# Patient Record
Sex: Male | Born: 1940 | Race: White | Hispanic: No | Marital: Married | Smoking: Current some day smoker
Health system: Southern US, Community
[De-identification: ages and names within clinical notes are randomized; demographics above are authoritative.]

## PROBLEM LIST (undated history)

## (undated) DIAGNOSIS — I2510 Atherosclerotic heart disease of native coronary artery without angina pectoris: Secondary | ICD-10-CM

## (undated) DIAGNOSIS — R0602 Shortness of breath: Secondary | ICD-10-CM

## (undated) DIAGNOSIS — J40 Bronchitis, not specified as acute or chronic: Secondary | ICD-10-CM

## (undated) DIAGNOSIS — E119 Type 2 diabetes mellitus without complications: Secondary | ICD-10-CM

## (undated) DIAGNOSIS — Z87442 Personal history of urinary calculi: Secondary | ICD-10-CM

## (undated) DIAGNOSIS — F17210 Nicotine dependence, cigarettes, uncomplicated: Secondary | ICD-10-CM

## (undated) DIAGNOSIS — Z8042 Family history of malignant neoplasm of prostate: Secondary | ICD-10-CM

## (undated) DIAGNOSIS — K2970 Gastritis, unspecified, without bleeding: Secondary | ICD-10-CM

## (undated) DIAGNOSIS — K589 Irritable bowel syndrome without diarrhea: Secondary | ICD-10-CM

## (undated) DIAGNOSIS — H269 Unspecified cataract: Secondary | ICD-10-CM

## (undated) DIAGNOSIS — K449 Diaphragmatic hernia without obstruction or gangrene: Secondary | ICD-10-CM

## (undated) DIAGNOSIS — N189 Chronic kidney disease, unspecified: Secondary | ICD-10-CM

## (undated) DIAGNOSIS — J449 Chronic obstructive pulmonary disease, unspecified: Secondary | ICD-10-CM

## (undated) DIAGNOSIS — R7303 Prediabetes: Secondary | ICD-10-CM

## (undated) DIAGNOSIS — M545 Low back pain, unspecified: Secondary | ICD-10-CM

## (undated) DIAGNOSIS — K219 Gastro-esophageal reflux disease without esophagitis: Secondary | ICD-10-CM

## (undated) DIAGNOSIS — Z87438 Personal history of other diseases of male genital organs: Secondary | ICD-10-CM

## (undated) DIAGNOSIS — Z800 Family history of malignant neoplasm of digestive organs: Secondary | ICD-10-CM

## (undated) DIAGNOSIS — M542 Cervicalgia: Secondary | ICD-10-CM

## (undated) DIAGNOSIS — M1990 Unspecified osteoarthritis, unspecified site: Secondary | ICD-10-CM

## (undated) DIAGNOSIS — E785 Hyperlipidemia, unspecified: Secondary | ICD-10-CM

## (undated) DIAGNOSIS — Z86010 Personal history of colonic polyps: Secondary | ICD-10-CM

## (undated) DIAGNOSIS — K5730 Diverticulosis of large intestine without perforation or abscess without bleeding: Secondary | ICD-10-CM

## (undated) DIAGNOSIS — K635 Polyp of colon: Secondary | ICD-10-CM

## (undated) DIAGNOSIS — I10 Essential (primary) hypertension: Secondary | ICD-10-CM

## (undated) HISTORY — DX: Unspecified osteoarthritis, unspecified site: M19.90

## (undated) HISTORY — DX: Unspecified cataract: H26.9

## (undated) HISTORY — DX: Hyperlipidemia, unspecified: E78.5

## (undated) HISTORY — DX: Diverticulosis of large intestine without perforation or abscess without bleeding: K57.30

## (undated) HISTORY — DX: Polyp of colon: K63.5

## (undated) HISTORY — DX: Irritable bowel syndrome, unspecified: K58.9

## (undated) HISTORY — DX: Personal history of urinary calculi: Z87.442

## (undated) HISTORY — DX: Low back pain, unspecified: M54.50

## (undated) HISTORY — DX: Gastro-esophageal reflux disease without esophagitis: K21.9

## (undated) HISTORY — DX: Cervicalgia: M54.2

## (undated) HISTORY — PX: ROTATOR CUFF REPAIR: SHX139

## (undated) HISTORY — DX: Type 2 diabetes mellitus without complications: E11.9

## (undated) HISTORY — DX: Diaphragmatic hernia without obstruction or gangrene: K44.9

## (undated) HISTORY — DX: Gastritis, unspecified, without bleeding: K29.70

## (undated) HISTORY — DX: Chronic obstructive pulmonary disease, unspecified: J44.9

## (undated) HISTORY — PX: LITHOTRIPSY: SUR834

## (undated) HISTORY — DX: Nicotine dependence, cigarettes, uncomplicated: F17.210

## (undated) HISTORY — DX: Personal history of other diseases of male genital organs: Z87.438

## (undated) HISTORY — PX: BACK SURGERY: SHX140

## (undated) HISTORY — DX: Essential (primary) hypertension: I10

## (undated) HISTORY — DX: Low back pain: M54.5

## (undated) HISTORY — PX: CERVICAL SPINE SURGERY: SHX589

---
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
## 1999-10-19 HISTORY — PX: UVULOPALATOPHARYNGOPLASTY: SHX827

## 2001-07-03 ENCOUNTER — Encounter: Payer: Self-pay | Admitting: Urology

## 2001-07-03 ENCOUNTER — Ambulatory Visit (HOSPITAL_COMMUNITY): Admission: RE | Admit: 2001-07-03 | Discharge: 2001-07-03 | Payer: Self-pay | Admitting: Urology

## 2001-07-03 ENCOUNTER — Encounter: Admission: RE | Admit: 2001-07-03 | Discharge: 2001-07-03 | Payer: Self-pay | Admitting: Urology

## 2001-07-06 ENCOUNTER — Ambulatory Visit (HOSPITAL_COMMUNITY): Admission: RE | Admit: 2001-07-06 | Discharge: 2001-07-06 | Payer: Self-pay | Admitting: Urology

## 2001-07-06 ENCOUNTER — Encounter: Payer: Self-pay | Admitting: Urology

## 2001-07-20 ENCOUNTER — Encounter: Payer: Self-pay | Admitting: Urology

## 2001-07-20 ENCOUNTER — Encounter: Admission: RE | Admit: 2001-07-20 | Discharge: 2001-07-20 | Payer: Self-pay | Admitting: Urology

## 2001-07-31 ENCOUNTER — Encounter: Payer: Self-pay | Admitting: Urology

## 2001-07-31 ENCOUNTER — Ambulatory Visit (HOSPITAL_COMMUNITY): Admission: RE | Admit: 2001-07-31 | Discharge: 2001-07-31 | Payer: Self-pay | Admitting: Urology

## 2001-08-14 ENCOUNTER — Encounter: Admission: RE | Admit: 2001-08-14 | Discharge: 2001-08-14 | Payer: Self-pay | Admitting: Urology

## 2001-08-14 ENCOUNTER — Encounter: Payer: Self-pay | Admitting: Urology

## 2002-07-04 ENCOUNTER — Encounter: Admission: RE | Admit: 2002-07-04 | Discharge: 2002-07-04 | Payer: Self-pay | Admitting: Urology

## 2002-07-04 ENCOUNTER — Encounter: Payer: Self-pay | Admitting: Urology

## 2002-08-18 HISTORY — PX: CYSTOSCOPY: SHX5120

## 2002-09-10 ENCOUNTER — Inpatient Hospital Stay (HOSPITAL_COMMUNITY): Admission: RE | Admit: 2002-09-10 | Discharge: 2002-09-11 | Payer: Self-pay | Admitting: Urology

## 2002-09-10 ENCOUNTER — Encounter (INDEPENDENT_AMBULATORY_CARE_PROVIDER_SITE_OTHER): Payer: Self-pay

## 2004-09-28 ENCOUNTER — Ambulatory Visit: Payer: Self-pay | Admitting: Pulmonary Disease

## 2004-11-02 ENCOUNTER — Ambulatory Visit (HOSPITAL_COMMUNITY): Admission: RE | Admit: 2004-11-02 | Discharge: 2004-11-02 | Payer: Self-pay | Admitting: Urology

## 2004-12-22 ENCOUNTER — Encounter: Admission: RE | Admit: 2004-12-22 | Discharge: 2004-12-22 | Payer: Self-pay | Admitting: Neurosurgery

## 2005-01-05 ENCOUNTER — Encounter: Admission: RE | Admit: 2005-01-05 | Discharge: 2005-01-05 | Payer: Self-pay | Admitting: Neurosurgery

## 2005-01-11 ENCOUNTER — Ambulatory Visit: Payer: Self-pay | Admitting: Pulmonary Disease

## 2005-01-18 ENCOUNTER — Encounter: Admission: RE | Admit: 2005-01-18 | Discharge: 2005-01-18 | Payer: Self-pay | Admitting: Neurosurgery

## 2005-04-05 ENCOUNTER — Ambulatory Visit: Payer: Self-pay | Admitting: Pulmonary Disease

## 2005-04-27 ENCOUNTER — Ambulatory Visit: Payer: Self-pay | Admitting: Pulmonary Disease

## 2005-05-31 ENCOUNTER — Ambulatory Visit: Payer: Self-pay | Admitting: Pulmonary Disease

## 2005-08-03 ENCOUNTER — Ambulatory Visit: Payer: Self-pay | Admitting: Pulmonary Disease

## 2005-10-18 HISTORY — PX: OTHER SURGICAL HISTORY: SHX169

## 2005-10-25 ENCOUNTER — Ambulatory Visit: Payer: Self-pay | Admitting: Pulmonary Disease

## 2005-11-04 ENCOUNTER — Ambulatory Visit: Payer: Self-pay | Admitting: Pulmonary Disease

## 2006-06-30 ENCOUNTER — Ambulatory Visit: Payer: Self-pay | Admitting: Pulmonary Disease

## 2006-08-24 ENCOUNTER — Ambulatory Visit: Payer: Self-pay | Admitting: Pulmonary Disease

## 2006-08-27 ENCOUNTER — Encounter: Admission: RE | Admit: 2006-08-27 | Discharge: 2006-08-27 | Payer: Self-pay | Admitting: Neurosurgery

## 2006-09-17 HISTORY — PX: LUMBAR MICRODISCECTOMY: SHX99

## 2006-09-19 ENCOUNTER — Ambulatory Visit (HOSPITAL_COMMUNITY): Admission: RE | Admit: 2006-09-19 | Discharge: 2006-09-19 | Payer: Self-pay | Admitting: Neurosurgery

## 2006-11-22 ENCOUNTER — Ambulatory Visit: Payer: Self-pay | Admitting: Pulmonary Disease

## 2006-11-22 LAB — CONVERTED CEMR LAB
Albumin: 4.1 g/dL (ref 3.5–5.2)
Creatinine, Ser: 1.4 mg/dL (ref 0.4–1.5)
GFR calc Af Amer: 66 mL/min
HDL: 36.6 mg/dL — ABNORMAL LOW (ref >39.0)
Hgb A1c MFr Bld: 7.1 % — ABNORMAL HIGH (ref 4.6–6.0)
Potassium: 4.4 meq/L (ref 3.5–5.1)
Sodium: 141 meq/L (ref 135–145)
Total Bilirubin: 0.5 mg/dL (ref 0.3–1.2)
Total CHOL/HDL Ratio: 3.7
Total Protein: 6.6 g/dL (ref 6.0–8.3)
Triglycerides: 107 mg/dL (ref 0–149)
VLDL: 21 mg/dL (ref 0–40)

## 2007-04-24 ENCOUNTER — Ambulatory Visit: Payer: Self-pay | Admitting: Pulmonary Disease

## 2007-04-24 LAB — CONVERTED CEMR LAB
ALT: 20 units/L (ref 0–53)
AST: 21 units/L (ref 0–37)
Bilirubin, Direct: 0.1 mg/dL (ref 0.0–0.3)
CO2: 32 meq/L (ref 19–32)
Calcium: 9.6 mg/dL (ref 8.4–10.5)
Chloride: 108 meq/L (ref 96–112)
Cholesterol: 157 mg/dL (ref 0–200)
Glucose, Bld: 135 mg/dL — ABNORMAL HIGH (ref 70–99)
Total CHOL/HDL Ratio: 3.6
Total Protein: 6.7 g/dL (ref 6.0–8.3)

## 2007-07-27 DIAGNOSIS — Z860100 Personal history of colon polyps, unspecified: Secondary | ICD-10-CM

## 2007-07-27 DIAGNOSIS — Z87442 Personal history of urinary calculi: Secondary | ICD-10-CM | POA: Insufficient documentation

## 2007-07-27 DIAGNOSIS — J449 Chronic obstructive pulmonary disease, unspecified: Secondary | ICD-10-CM | POA: Insufficient documentation

## 2007-07-27 DIAGNOSIS — Z87898 Personal history of other specified conditions: Secondary | ICD-10-CM | POA: Insufficient documentation

## 2007-07-27 DIAGNOSIS — J4489 Other specified chronic obstructive pulmonary disease: Secondary | ICD-10-CM | POA: Insufficient documentation

## 2007-07-27 DIAGNOSIS — M1990 Unspecified osteoarthritis, unspecified site: Secondary | ICD-10-CM | POA: Insufficient documentation

## 2007-07-27 HISTORY — DX: Personal history of colonic polyps: Z86.010

## 2007-07-27 HISTORY — DX: Personal history of colon polyps, unspecified: Z86.0100

## 2007-09-25 ENCOUNTER — Ambulatory Visit: Payer: Self-pay | Admitting: Pulmonary Disease

## 2007-09-25 DIAGNOSIS — I10 Essential (primary) hypertension: Secondary | ICD-10-CM | POA: Insufficient documentation

## 2007-10-05 LAB — CONVERTED CEMR LAB
BUN: 16 mg/dL (ref 6–23)
Chloride: 96 meq/L (ref 96–112)
Creatinine, Ser: 1.1 mg/dL (ref 0.4–1.5)
Hgb A1c MFr Bld: 6.9 % — ABNORMAL HIGH (ref 4.6–6.0)
VLDL: 29 mg/dL (ref 0–40)

## 2009-02-28 ENCOUNTER — Ambulatory Visit: Payer: Self-pay | Admitting: Pulmonary Disease

## 2009-03-01 DIAGNOSIS — F17200 Nicotine dependence, unspecified, uncomplicated: Secondary | ICD-10-CM | POA: Insufficient documentation

## 2009-03-01 DIAGNOSIS — E785 Hyperlipidemia, unspecified: Secondary | ICD-10-CM | POA: Insufficient documentation

## 2009-03-01 DIAGNOSIS — E1129 Type 2 diabetes mellitus with other diabetic kidney complication: Secondary | ICD-10-CM | POA: Insufficient documentation

## 2009-03-01 DIAGNOSIS — E119 Type 2 diabetes mellitus without complications: Secondary | ICD-10-CM | POA: Insufficient documentation

## 2009-03-01 LAB — CONVERTED CEMR LAB
AST: 18 units/L (ref 0–37)
BUN: 17 mg/dL (ref 6–23)
Basophils Absolute: 0 10*3/uL (ref 0.0–0.1)
Cholesterol: 133 mg/dL (ref 0–200)
Eosinophils Absolute: 0.2 10*3/uL (ref 0.0–0.7)
GFR calc non Af Amer: 71.31 mL/min (ref >60)
Glucose, Bld: 107 mg/dL — ABNORMAL HIGH (ref 70–99)
HCT: 44.5 % (ref 39.0–52.0)
HDL: 43.3 mg/dL (ref >39.00)
Hgb A1c MFr Bld: 6.5 % (ref 4.6–6.5)
Leukocytes, UA: NEGATIVE
Lymphs Abs: 2.1 10*3/uL (ref 0.7–4.0)
MCHC: 35 g/dL (ref 30.0–36.0)
Monocytes Absolute: 0.5 10*3/uL (ref 0.1–1.0)
Monocytes Relative: 8.7 % (ref 3.0–12.0)
Nitrite: NEGATIVE
Platelets: 256 10*3/uL (ref 150.0–400.0)
Potassium: 4.1 meq/L (ref 3.5–5.1)
RDW: 12 % (ref 11.5–14.6)
Specific Gravity, Urine: 1.02 (ref 1.000–1.030)
Total Bilirubin: 0.9 mg/dL (ref 0.3–1.2)
Urobilinogen, UA: 0.2 (ref 0.0–1.0)
VLDL: 19.4 mg/dL (ref 0.0–40.0)
pH: 6.5 (ref 5.0–8.0)

## 2009-04-11 ENCOUNTER — Ambulatory Visit: Payer: Self-pay | Admitting: Internal Medicine

## 2009-05-06 ENCOUNTER — Encounter: Payer: Self-pay | Admitting: Internal Medicine

## 2009-05-06 ENCOUNTER — Ambulatory Visit: Payer: Self-pay | Admitting: Internal Medicine

## 2009-05-06 HISTORY — PX: COLONOSCOPY W/ BIOPSIES AND POLYPECTOMY: SHX1376

## 2009-05-08 ENCOUNTER — Encounter: Payer: Self-pay | Admitting: Internal Medicine

## 2009-08-06 ENCOUNTER — Ambulatory Visit: Payer: Self-pay | Admitting: Pulmonary Disease

## 2009-09-08 ENCOUNTER — Telehealth: Payer: Self-pay | Admitting: Pulmonary Disease

## 2009-11-19 ENCOUNTER — Telehealth: Payer: Self-pay | Admitting: Pulmonary Disease

## 2009-12-01 ENCOUNTER — Encounter: Payer: Self-pay | Admitting: Pulmonary Disease

## 2009-12-01 ENCOUNTER — Telehealth: Payer: Self-pay | Admitting: Pulmonary Disease

## 2009-12-08 ENCOUNTER — Ambulatory Visit: Payer: Self-pay | Admitting: Pulmonary Disease

## 2009-12-12 ENCOUNTER — Ambulatory Visit: Payer: Self-pay | Admitting: Pulmonary Disease

## 2009-12-12 LAB — CONVERTED CEMR LAB
ALT: 17 units/L (ref 0–53)
AST: 21 units/L (ref 0–37)
BUN: 19 mg/dL (ref 6–23)
Basophils Relative: 0.7 % (ref 0.0–3.0)
Bilirubin Urine: NEGATIVE
CO2: 30 meq/L (ref 19–32)
Chloride: 106 meq/L (ref 96–112)
Cholesterol: 139 mg/dL (ref 0–200)
Creatinine, Ser: 1.1 mg/dL (ref 0.4–1.5)
Eosinophils Absolute: 0.3 10*3/uL (ref 0.0–0.7)
Hemoglobin, Urine: NEGATIVE
Leukocytes, UA: NEGATIVE
Lymphocytes Relative: 31.5 % (ref 12.0–46.0)
MCHC: 32.6 g/dL (ref 30.0–36.0)
MCV: 95.1 fL (ref 78.0–100.0)
Monocytes Absolute: 0.5 10*3/uL (ref 0.1–1.0)
Neutrophils Relative %: 52.7 % (ref 43.0–77.0)
Nitrite: NEGATIVE
PSA: 0.63 ng/mL (ref 0.10–4.00)
Platelets: 283 10*3/uL (ref 150.0–400.0)
Potassium: 4.7 meq/L (ref 3.5–5.1)
RBC: 4.97 M/uL (ref 4.22–5.81)
TSH: 2.19 microintl units/mL (ref 0.35–5.50)
Total Protein, Urine: NEGATIVE mg/dL
Total Protein: 7.4 g/dL (ref 6.0–8.3)
Triglycerides: 105 mg/dL (ref 0.0–149.0)
Urobilinogen, UA: 0.2 (ref 0.0–1.0)
WBC: 5 10*3/uL (ref 4.5–10.5)

## 2009-12-15 ENCOUNTER — Encounter: Payer: Self-pay | Admitting: Pulmonary Disease

## 2009-12-31 ENCOUNTER — Encounter: Payer: Self-pay | Admitting: Pulmonary Disease

## 2010-02-09 ENCOUNTER — Ambulatory Visit: Payer: Self-pay | Admitting: Pulmonary Disease

## 2010-02-09 ENCOUNTER — Telehealth (INDEPENDENT_AMBULATORY_CARE_PROVIDER_SITE_OTHER): Payer: Self-pay | Admitting: *Deleted

## 2010-07-13 ENCOUNTER — Encounter: Payer: Self-pay | Admitting: Adult Health

## 2010-07-13 ENCOUNTER — Ambulatory Visit: Payer: Self-pay | Admitting: Internal Medicine

## 2010-07-15 LAB — CONVERTED CEMR LAB
ALT: 17 units/L (ref 0–53)
Alkaline Phosphatase: 93 units/L (ref 39–117)
BUN: 19 mg/dL (ref 6–23)
Basophils Relative: 0.2 % (ref 0.0–3.0)
Bilirubin Urine: NEGATIVE
Bilirubin, Direct: 0.1 mg/dL (ref 0.0–0.3)
Calcium: 9.7 mg/dL (ref 8.4–10.5)
Chloride: 102 meq/L (ref 96–112)
Creatinine, Ser: 1 mg/dL (ref 0.4–1.5)
Eosinophils Relative: 2.3 % (ref 0.0–5.0)
GFR calc non Af Amer: 75.76 mL/min (ref >60)
Lymphocytes Relative: 17.6 % (ref 12.0–46.0)
MCV: 92.2 fL (ref 78.0–100.0)
Monocytes Absolute: 0.5 10*3/uL (ref 0.1–1.0)
Monocytes Relative: 6.3 % (ref 3.0–12.0)
Neutrophils Relative %: 73.6 % (ref 43.0–77.0)
Nitrite: NEGATIVE
Platelets: 392 10*3/uL (ref 150.0–400.0)
RBC: 4.74 M/uL (ref 4.22–5.81)
Sed Rate: 5 mm/hr (ref 0–22)
Total Bilirubin: 0.5 mg/dL (ref 0.3–1.2)
Total Protein, Urine: NEGATIVE mg/dL
Total Protein: 6.5 g/dL (ref 6.0–8.3)
Urine Glucose: NEGATIVE mg/dL
WBC: 8.6 10*3/uL (ref 4.5–10.5)
pH: 5.5 (ref 5.0–8.0)

## 2010-07-27 ENCOUNTER — Ambulatory Visit: Payer: Self-pay | Admitting: Pulmonary Disease

## 2010-07-29 ENCOUNTER — Telehealth (INDEPENDENT_AMBULATORY_CARE_PROVIDER_SITE_OTHER): Payer: Self-pay | Admitting: *Deleted

## 2010-07-30 ENCOUNTER — Ambulatory Visit: Payer: Self-pay | Admitting: Pulmonary Disease

## 2010-07-30 LAB — CONVERTED CEMR LAB
Fecal Occult Blood: NEGATIVE
OCCULT 1: NEGATIVE
OCCULT 5: NEGATIVE

## 2010-08-03 ENCOUNTER — Encounter: Payer: Self-pay | Admitting: Pulmonary Disease

## 2010-08-17 ENCOUNTER — Ambulatory Visit: Payer: Self-pay | Admitting: Pulmonary Disease

## 2010-08-17 ENCOUNTER — Telehealth: Payer: Self-pay | Admitting: Internal Medicine

## 2010-08-17 ENCOUNTER — Telehealth: Payer: Self-pay | Admitting: Pulmonary Disease

## 2010-08-17 DIAGNOSIS — K589 Irritable bowel syndrome without diarrhea: Secondary | ICD-10-CM | POA: Insufficient documentation

## 2010-08-17 DIAGNOSIS — R634 Abnormal weight loss: Secondary | ICD-10-CM | POA: Insufficient documentation

## 2010-08-18 ENCOUNTER — Ambulatory Visit: Payer: Self-pay | Admitting: Cardiovascular Disease

## 2010-08-18 ENCOUNTER — Encounter: Payer: Self-pay | Admitting: Pulmonary Disease

## 2010-08-19 ENCOUNTER — Ambulatory Visit: Payer: Self-pay | Admitting: Internal Medicine

## 2010-08-19 ENCOUNTER — Encounter (INDEPENDENT_AMBULATORY_CARE_PROVIDER_SITE_OTHER): Payer: Self-pay | Admitting: *Deleted

## 2010-09-07 ENCOUNTER — Ambulatory Visit: Payer: Self-pay | Admitting: Internal Medicine

## 2010-09-07 HISTORY — PX: UPPER GASTROINTESTINAL ENDOSCOPY: SHX188

## 2010-09-10 ENCOUNTER — Telehealth: Payer: Self-pay | Admitting: Internal Medicine

## 2010-11-07 ENCOUNTER — Encounter: Payer: Self-pay | Admitting: Neurosurgery

## 2010-11-17 NOTE — Progress Notes (Signed)
Summary: pt here now wants apt  Phone Note Call from Patient   Caller: Patient Call For: nadel Summary of Call: patient is here now. wants to see tammy or nadel. he has a deep chest cold.  Initial call taken by: Valinda Hoar,  February 09, 2010 8:11 AM  Follow-up for Phone Call        told Dot Lanes to offer pt 3:45 today with tp--per Dot Lanes pt took this appt  Follow-up by: Philipp Deputy CMA,  February 09, 2010 9:01 AM

## 2010-11-17 NOTE — Assessment & Plan Note (Signed)
Summary: abdominal pain/sheri    History of Present Illness Visit Type: Consult Primary GI MD: Stan Head MD [facility] Primary Kraven Calk: Lalla Brothers Requesting Wilbur Oakland: Lalla Brothers Chief Complaint: Abdominal Pain for 8 weeks, has been seeing Dr Kriste Basque for his pain History of Present Illness:   September 7 he took wife to airport for mission trip to [place]. He became acutely ill with nausea, vomiting and dairrhea. Then  a few days later it recured. Weak after that. Lost 12-15#. reduced activity for a while. He waited a week and things improved but stool has not normalized. He eats and then within an hour he has urge to defecate. No pain. Borborygmi has also been noted.  He saw Dr. Kriste Basque initially, held his meds including fiber pills and metformin and then resumed all but metformin after that. He has not regained weight yet, remains fatigued. Appetite not as robust as before.  he started metronidazole yesterday.   GI Review of Systems    Reports abdominal pain, bloating, loss of appetite, nausea, vomiting, and  weight loss.      Denies acid reflux, belching, chest pain, dysphagia with liquids, dysphagia with solids, heartburn, vomiting blood, and  weight gain.      Reports change in bowel habits, diarrhea, and  irritable bowel syndrome.     Denies anal fissure, black tarry stools, constipation, diverticulosis, fecal incontinence, heme positive stool, hemorrhoids, jaundice, light color stool, liver problems, rectal bleeding, and  rectal pain.    Clinical Reports Reviewed:  Colonoscopy:  05/06/2009:  1) Polyps, multiple (6, max size 8 mm) ADENOMAS 2) Mild diverticulosis in the sigmoid colon 3) External hemorrhoids 4) Otherwise normal examination, excellent prep 5) prior adenoma(s) years ago 6) mother with suspected colon cancer  CT Abdomen/Pelvis  Procedure date:  08/18/2010  Findings:      1. No acute process in the abdomen or pelvis. 2.  Bilateral nonobstructive renal  calculi. 3.  Apparent gastric wall thickening, favored to be due to underdistension.     Current Medications (verified): 1)  Adult Aspirin Ec Low Strength 81 Mg  Tbec (Aspirin) .... Take 1 Tablet By Mouth Once A Day 2)  Hydrochlorothiazide 50 Mg  Tabs (Hydrochlorothiazide) .... Take 1/2 Tablet By Mouth Once A Day 3)  Potassium Citrate 1080 Mg Cr-Tabs (Potassium Citrate) .... Take 1 Tablet By Mouth Once A Day 4)  Simvastatin 20 Mg  Tabs (Simvastatin) .... Take 1 By Mouth At Bedtime 5)  Lopid 600 Mg  Tabs (Gemfibrozil) .... Take 1 Tablet By Mouth Once A Day 6)  Ondansetron Hcl 4 Mg Tabs (Ondansetron Hcl) .Marland Kitchen.. 1 By Mouth Every 8 Hr As Needed Nausea 7)  Fibertab 625 Mg Tabs (Calcium Polycarbophil) .... Take One By Mouth Two Times A Day 8)  Levsin 0.125 Mg Tabs (Hyoscyamine Sulfate) .Marland Kitchen.. 1 Three Times A Day As Needed Abdominal Cramping 9)  Metronidazole 500 Mg Tabs (Metronidazole) .... Take 1 Tab By Mouth Three Times A Day...  Allergies (verified): No Known Drug Allergies  Past History:  Past Medical History: Reviewed history from 12/08/2009 and no changes required.  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 S/P CSpine surg in 1986 &  1995 S/P UPPP surgery for snoring 2001 by DrShoemaker S/P cystoscopy/ TURP & stone removal 11/03 by DrEvans S/P dental implants in 2007 S/P L3-4 microdiscectomy 12/07 by DrPool  Family History: Father died age 68 from DM w/ complications Mother died at w/ hx of colon  cancer (?she had met dis and chemoRx), died of "natural causes" 3 Siblings: 1 Brother  2 Sisters  Social History: Married Children- 2  +smoker - 1-3 cigarettess daily when golfing social alcohol retired from [employer]  Review of Systems       The patient complains of fatigue, headaches-new, shortness of breath, and sleeping problems.         All other ROS negative except as per HPI.   Vital Signs:  Patient profile:   70 year old male Height:      71 inches Weight:      161 pounds BMI:     22.54 BSA:     1.92 Pulse rate:   78 / minute Pulse rhythm:   regular BP sitting:   110 / 80  (left arm)  Vitals Entered By: Merri Ray CMA Duncan Dull) (August 19, 2010 8:27 AM)  Physical Exam  General:  Well developed, well nourished, no acute distress. Eyes:  PERRLA, no icterus. Mouth:  upper dentures, missing some lower teeth Lungs:  Clear throughout to auscultation. Heart:  Regular rate and rhythm; no murmurs, rubs,  or bruits. Abdomen:  Soft, nontender and nondistended. No masses, hepatosplenomegaly or hernias noted. Normal bowel sounds. Extremities:  No clubbing, cyanosis, edema  noted. Cervical Nodes:  No significant cervical or supraclavicular adenopathy.  Psych:  Alert and cooperative. Normal mood and affect.   Impression & Recommendations:  Problem # 1:  ABNORMAL FINDINGS GI TRACT (ICD-793.4) Assessment New  Thickened gastric wall on CT. Inmages viewed Probably underdistention but in setting of the weight loss and recent problems, I have advised EGD. Risks, benefits,and indications of endoscopic procedure(s) were reviewed with the patient and all questions answered.  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 will stop fiberand stay off metformin (he says sugae\rs better - could be due to weight loss) taking metronidazole which is reasonable - he  may have some altered gut flora at least which this can help hemoccults negative - goes against ongoing infection stool studies pending but C. diff toxin negative CBC, CMET ok in Sept ESR 5 TSH NL in Feb 2011  Problem # 3:  WEIGHT LOSS (ICD-783.21) Assessment: Unchanged New to me: Associated with nausea/vomiting and diarrhe severe sxs better but not regaining weight and appetite not yet back.  Patient Instructions: 1)  Hold fiber pills - we will place a hold on your list so it will still be on there. 2)  We will see you at your procedure on 09/01/10. 3)  [facility] Patient Information Guide given to patient.  4)  Upper Endoscopy brochure given.  5)  Copy sent to : Alroy Dust, MD 6)  The medication list was reviewed and reconciled.  All changed / newly prescribed medications were explained.  A complete medication list was provided to the patient / caregiver.

## 2010-11-17 NOTE — Assessment & Plan Note (Signed)
Summary: cpx/jd   CC:  9 month ROV & yearly CPX....  History of Present Illness: 70 y/o Werner here for a follow up visit... he has multiple medical problems as noted below...    ~  Mar 01, 2009:  he has been on HCTZ and Potassium Citrate from Urology due to a hx of kidney stones- last in 2002... he is inclined to stop this medication at present and see how he does... he was followed by DrEvans and he will contact him at [facility] for f/u appt...  states feeling well overall, due for f/u of Chol & DM...   ~  December 08, 2009:  he has been feeling well- no new complaints or concerns... he is still taking the HCTZ but stopped his KCl- I refilled both & asked him to be sure he takes the K Bid if taking the diuretic... he never did f/u w/ DrEvans & prefers referral to Urology in [place]- I offered to wean his HCTZ/ KCl meds but her prefers to talk w/ Urology 1st & we will set this up... he is still smoking a few- mostly while golfing, none at home, est ~ 1ppwk... f/u CXR today is clear, he knows he needs to quit entirely...  he had f/u colonoscopy 7/10 by DrGessner w/ several adenomatous polyps removed- f/u planned 39yrs...    Current Problems:   S/P ENT SURG by DrShoemaker 2000 for SNORING- ?UPPP w/ resolution of snoring problem...  COPD (ICD-496) - smoker w/ hx of intermittent bronchitic problems, no chronic dyspnea, cough, sputum, etc... not on regular meds and hasn't required antibiotics in quite some time...  ~  PFT's 2000 showed FVC= 3.33 (74%), FEV1= 1.76 (45%), %1sec= 52, mid-flows= 34% pred.  ~  baseline CXR w/ sl hyperinflation, clear, NAD.Marland Kitchen.  ~  f/u CXR 2/11 shows no acute changes...  CIGARETTE SMOKER (ICD-305.1) - still smokes on occas but not regularly per pt- eg. on the golf [place]... he was able to decr smoking w/ Chantix Rx... he knows that he needs to discontinue all smoking.  HYPERTENSION (ICD-401.9) - diet controlled, but he was placed on HCTZ by DrEvans for kidney stone prevention in  about 2003, and he wants to stop this now... asked to monitor BP at home w/ digital cuff...  BP today = 110/74> denies HA, fatigue, visual changes, CP, palipit, dizziness, syncope, dyspnea, edema, etc...   HYPERLIPIDEMIA (ICD-272.4) - on SIMVASTATIN 20mg /d & LOPID 600mg /d... his TG level was [phone] range in 1995-6 w/ Gemfibrizol started then...  ~  FLP 1/07 on Lopid showed TChol 207, TG 77, HDL 41, LDL 147... rec> Simva20 added.  ~  FLP 11/07 showed TChol 141, TG 82, HDL 37, LDL 87  ~  FLP 7/08 showed TChol 157, TG 99, HDL 44, LDL 94  ~  FLP 5/10 showed TChol 133, TG 97, HDL 43, LDL 70  ~  FLP 2/11 showed   DIABETES MELLITUS (ICD-250.00) - + FamHx w/ DM in father... pt diet controlled in past- but in 2007 he took Medrol Rx from [employer], Neurosurg for LBP and developed problems while on a mission trip to [place]... Metformin started then and currently taking METFORMIN 500mg  Qam...  ~  labs 11/07 showed BS= 98, A1c= 7.1  ~  labs 7/08 showed BS= 135, A1c= 6.7  ~  labs 12/08 showed BS= 111, A1c= 6.9  ~  labs 5/10 showed BS= 107, A1c= 6.5  ~  labs 2/11 showed   HIATAL HERNIA (ICD-553.3) - he had an  EGD in 1987 showing a patulous LES, GERD, antritis...   DIVERTICULOSIS OF COLON (ICD-562.10) COLONIC POLYPS (ICD-211.3) - there is a pos family hx of colon cancer in his mother...  ~  colonoscopy 7/04 showed divertics, hems, no polyps... f/u planned 5 yrs.  ~  finally sched his f/u colon 7/10 DrGessner w/ 6 polyps removed- max 8mm- mult tubular adenomas removed & f/u planned 29yrs. HEMORRHOIDS (ICD-455.6)  RENAL CALCULUS, HX OF (ICD-V13.01) - on HCTZ 50mg /d and Urocit-K Bid per DrEvans... he wants to stop these meds but prefers to have Urology appt 1st to discuss this & we will set this up for him.  ~  s/p right ureteroscopy 2002 by DrEvans for stone...  BENIGN PROSTATIC HYPERTROPHY, HX OF (ICD-V13.8) - s/p TURP & removal of bladder stone in 2003 by DrEvans...  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 Hx of NECK PAIN (ICD-723.1) - s/p CSpine surg x2 in 1986 & 1995... Hx of BACK PAIN, LUMBAR (ICD-724.2) - s/p Lumbar microdiscectomy 12/07 by DrPool...  Health Maintenance:  ~  GI= DrGessner  ~  GU= prev DrEvans & we will refer to [facility] per his request... DRE neg, PSA pending.  ~  Immunizations:  had 2010 Flu vaccine... ? Pneumovax ? Tetanus? probably before his [place] trip in 2006.   Allergies (verified): No Known Drug Allergies  Comments:  Nurse/Medical Assistant: The patient's medications and allergies were reviewed with the patient and were updated in the Medication and Allergy Lists.  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 Father died age 52 from DM w/ complications Mother alive age 37 w/ hx of colon cancer (?she had met dis and chemoRx) 3 Siblings: 1 Brother  2 Sisters  Social History: Reviewed history from 08/06/2009 and no changes required. Married Children +smoker - 1-3 cigs daily social alcohol retired from [employer]  Review of Systems       The patient complains of stiffness and arthritis.  The patient denies fever,  chills, sweats, anorexia, fatigue, weakness, malaise, weight loss, sleep disorder, blurring, diplopia, eye irritation, eye discharge, vision loss, eye pain, photophobia, earache, ear discharge, tinnitus, decreased hearing, nasal congestion, nosebleeds, sore throat, hoarseness, chest pain, palpitations, syncope, dyspnea on exertion, orthopnea, PND, peripheral edema, cough, dyspnea at rest, excessive sputum, hemoptysis, wheezing, pleurisy, nausea, vomiting, diarrhea, constipation, change in bowel habits, abdominal pain, melena, hematochezia, jaundice, gas/bloating, indigestion/heartburn, dysphagia, odynophagia, dysuria, hematuria, urinary frequency, urinary hesitancy, nocturia, incontinence, back pain, joint pain, joint swelling, muscle cramps, muscle weakness, sciatica, restless legs, leg pain at night, leg pain with exertion, rash, itching, dryness, suspicious lesions, paralysis, paresthesias, seizures, tremors, vertigo, transient blindness, frequent falls, frequent headaches, difficulty walking, depression, anxiety, memory loss, confusion, cold intolerance, heat intolerance, polydipsia, polyphagia, polyuria, unusual weight change, abnormal bruising, bleeding, enlarged lymph nodes, urticaria, allergic rash, hay fever, and recurrent  infections.    Vital Signs:  Patient profile:   70 year old male Height:      71 inches Weight:      180.13 pounds O2 Sat:      96 % on Room air Temp:     98.4 degrees F oral Pulse rate:   75 / minute BP sitting:   110 / 74  (left arm) Cuff size:   regular  Vitals Entered By: Randell Loop CMA (December 08, 2009 10:30 AM)  O2 Sat at Rest %:  96 O2 Flow:  Room air CC: 9 month ROV & yearly CPX... Is Patient Diabetic? Yes Pain Assessment Patient in pain? no      Comments meds updated today   Physical Exam  Additional Exam:  WD, WN, 70 y/o Werner in NAD... GENERAL:  Alert & oriented; pleasant & cooperative... HEENT:  [place]/AT, EOM-wnl, PERRLA, EACs-clear, TMs-wnl,  NOSE-clear, THROAT-clear & wnl. NECK:  Supple w/ decrROM; no JVD; normal carotid impulses w/o bruits; no thyromegaly or nodules palpated; no lymphadenopathy. CHEST:  Clear to P & A; without wheezes/ rales/ or rhonchi heard... HEART:  Regular Rhythm; without murmurs/ rubs/ or gallops detected... ABDOMEN:  Soft & nontender; normal bowel sounds; no organomegaly or masses palpated... EXT: without deformities, mild arthritic changes; no varicose veins/ venous insuffic/ or edema. NEURO: CNs intact, no focal neuro deficits... DERM:  No lesions noted; no rash etc...    CXR  Procedure date:  12/08/2009  Findings:      CHEST - 2 VIEW Comparison: 02/28/2009   Findings: Heart and mediastinal contours are within normal limits. No focal opacities or effusions.  No acute bony abnormality.   IMPRESSION: No acute cardiopulmonary disease.   No change.   Read By:  Charlett Nose,  M.D.    MISC. Report  Procedure date:  12/08/2009  Findings:      He will have to return FASTING for blood work later this week... we will review & notify the pt of these results...  SN   Impression & Recommendations:  Problem # 1:  COPD (ICD-496) CXR is clear, he is asymptomatic but still smoking a few... he does not want inhaled meds, & feels he is doing well- advised to quit smoking completely... Orders: T-2 View CXR (71020TC)  Problem # 2:  HYPERTENSION (ICD-401.9) BP well controlled w/ diet + HCT for renal stone disease... he wants to stop the diuretic & will f/u BP's at home off the med on diet alone... His updated medication list for this problem includes:    Hydrochlorothiazide 50 Mg Tabs (Hydrochlorothiazide) .Marland Kitchen... Take 1 tablet by mouth once a day  Problem # 3:  HYPERLIPIDEMIA (ICD-272.4) He's demonstrated good control on these meds last yr... continues on diet + same 2 meds w/ f/u FLP pending. His updated medication list for this problem includes:    Simvastatin 20 Mg Tabs (Simvastatin) .Marland Kitchen... Take  1 by mouth at bedtime    Lopid 600 Mg Tabs (Gemfibrozil) .Marland Kitchen... Take 1 tablet by mouth once a day  Problem # 4:  DIABETES MELLITUS (ICD-250.00) Similarly he's had good control on BS on diet alone... f/u lab pending. His updated medication list for this problem includes:    Adult Aspirin Ec Low Strength 81 Mg Tbec (Aspirin) .Marland Kitchen... Take 1 tablet by mouth once a day    Metformin Hcl 500 Mg Xr24h-tab (Metformin hcl) .Marland Kitchen... Take 1 tab by mouth once daily...  Problem # 5:  COLONIC POLYPS (ICD-211.3) GI stable-  s/p colon 7/10 w/ 6 polyps removed... DrGesssner plans f/u procedure in 48yrs.  Problem # 6:  RENAL CALCULUS, HX OF (ICD-V13.01) PSA is pending & we will forward to Urology... 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 His updated medication list for this problem includes:    Adult Aspirin Ec Low Strength 81 Mg Tbec (Aspirin) .Marland Kitchen... Take 1 tablet by mouth once a day  Problem # 8:  OTHER MEDICAL PROBLEMS AS NOTED>>>  Complete Medication List: 1)  Adult Aspirin Ec Low Strength 81 Mg Tbec (Aspirin) .... Take 1 tablet by mouth once a day 2)  Hydrochlorothiazide 50 Mg Tabs (Hydrochlorothiazide) .... Take 1 tablet by mouth once a day 3)  Potassium Citrate 1080 Mg Cr-tabs (Potassium citrate) .... Take 2 tablets by mouth daily 4)  Simvastatin 20 Mg Tabs (Simvastatin) .... Take 1 by mouth at bedtime 5)  Lopid 600 Mg Tabs (Gemfibrozil) .... Take 1 tablet by mouth once a day 6)  Metformin Hcl 500 Mg Xr24h-tab (Metformin hcl) .... Take 1 tab by mouth once daily.Marland KitchenMarland Kitchen 7)  Viagra 100 Mg Tabs (Sildenafil citrate) .... Use as directed...  Other Orders: Prescription Created Electronically [phone]) Pneumococcal Vaccine (60454) Admin 1st Vaccine (09811)  Patient Instructions: 1)  Today we  updated your med list- see below.... 2)  We refilled your meds for 2011... 3)  We gave you a PNEUMONIA vaccine today- (currently they are recommending one shot after age 59 for all adults) 4)  We will arrange for a follow up appt w/ Urology to discuss your hx of kidney stones and the poss of coming off the HCTZ & K... 5)  Today we did your follow up CXR, please return to our lab later this week for your fasting blood work... then call the "phone tree" in a few days for your lab results.Marland KitchenMarland Kitchen 6)  Call for any questions.Marland KitchenMarland Kitchen 7)  Please schedule a follow-up appointment in 1 year, sooner as needed. Prescriptions: VIAGRA 100 MG TABS (SILDENAFIL CITRATE) use as directed...  #10 x prn   Entered and Authorized by:   Michele Mcalpine MD   Signed by:   Michele Mcalpine MD on 12/08/2009   Method used:   Print then Give to Patient   RxID:   [id number] METFORMIN HCL 500 MG XR24H-TAB (METFORMIN HCL) take 1 tab by mouth once daily...  #90 x prn   Entered and Authorized by:   Michele Mcalpine MD   Signed by:   Michele Mcalpine MD on 12/08/2009   Method used:   Print then Give to Patient   RxID:   [id number] LOPID 600 MG  TABS (GEMFIBROZIL) Take 1 tablet by mouth once a day  #90 x prn   Entered and Authorized by:   Michele Mcalpine MD   Signed by:   Michele Mcalpine MD on 12/08/2009   Method used:   Print then Give to Patient   RxID:   [id number] SIMVASTATIN 20 MG  TABS (SIMVASTATIN) take 1 by mouth at bedtime  #90 x prn   Entered and Authorized by:   Michele Mcalpine MD   Signed by:   Michele Mcalpine MD on 12/08/2009   Method used:   Print then Give to Patient   RxID:   [id number] POTASSIUM CITRATE 1080 MG  CR-TABS (POTASSIUM CITRATE) take 2 tablets by mouth daily  #180 x prn   Entered and Authorized by:   Michele Mcalpine MD   Signed by:   Michele Mcalpine MD on 12/08/2009   Method used:   Print then Give to Patient   RxID:   [id number] HYDROCHLOROTHIAZIDE 50 MG  TABS (HYDROCHLOROTHIAZIDE) Take 1 tablet  by mouth once a day  #90 x prn   Entered and Authorized by:   Michele Mcalpine MD   Signed by:   Michele Mcalpine MD on 12/08/2009   Method used:   Print then Give to Patient   RxID:   [id number]    Immunizations Administered:  Pneumonia Vaccine:    Vaccine Type: Pneumovax    Site: left deltoid    Mfr: Merck    Dose: 0.5 ml    Route: IM    Given by: Randell Loop CMA    Exp. Date: 02/05/2011    Lot #: [id number]    VIS given: 05/15/96 version given December 08, 2009.

## 2010-11-17 NOTE — Medication Information (Signed)
Summary: [facility]  Diabetes [facility]   Imported By: Sherian Rein 12/05/2009 10:26:05  _____________________________________________________________________  External Attachment:    Type:   Image     Comment:   External Document

## 2010-11-17 NOTE — Progress Notes (Signed)
Summary: Work in next wk   Phone Note From Other Clinic   Caller: [phone] [facility] @ Dr Kriste Basque Call For: Dr Leone Payor Reason for Call: Schedule Patient Appt Summary of Call: Wants pt seen next week for abd Pain. Having ultrasound tomorrow Initial call taken by: Leanor Kail [facility],  August 17, 2010 12:17 PM  Follow-up for Phone Call        Left message for patient to call back Darcey Nora RN, [facility]  August 17, 2010 3:08 PM  patient is schedueld to see Dr Percell Miller for 08/19/10 8:30.  Almyra Free will notify the patient. Follow-up by: Darcey Nora RN, CGRN,  August 17, 2010 3:43 PM

## 2010-11-17 NOTE — Letter (Signed)
Summary: [facility]   Imported By: Lester Henderson 12/26/2009 09:02:04  _____________________________________________________________________  External Attachment:    Type:   Image     Comment:   External Document

## 2010-11-17 NOTE — Letter (Signed)
Summary: [facility]   Imported By: Sherian Rein 01/07/2010 12:10:38  _____________________________________________________________________  External Attachment:    Type:   Image     Comment:   External Document

## 2010-11-17 NOTE — Progress Notes (Signed)
Summary: Supporting lab dx code  ---- Converted from flag ---- ---- 07/29/2010 9:17 AM, Rubye Oaks NP wrote: 789.09  thanks   ---- 07/29/2010 9:16 AM, Leodis Liverpool wrote: Do you know the code for abdominal pain? Darl Pikes  ---- 07/27/2010 11:27 AM, Rubye Oaks NP wrote: use abdominal pain  thanx   ---- 07/27/2010 9:11 AM, Leodis Liverpool wrote: Need diagnosis code for urine cx from 07/13/10.  535.50 denied by insurance when [employer].  Thanks. Darl Pikes ------------------------------

## 2010-11-17 NOTE — Procedures (Signed)
Summary: Upper Endoscopy  Patient: [name] Note: All result statuses are Final unless otherwise noted.  Tests: (1) Upper Endoscopy (EGD)   EGD Upper Endoscopy       DONE     [facility]     [address]     [place], [place]  [address]           ENDOSCOPY PROCEDURE REPORT           PATIENT:  Paul, Werner  MR#:  [id number]     BIRTHDATE:  Mar 15, 1941, 68 yrs. old  GENDER:  male           ENDOSCOPIST:  Iva Boop, MD, [facility]           PROCEDURE DATE:  09/07/2010     PROCEDURE:  EGD with biopsy, 84[address]     ASA CLASS:  Class II     INDICATIONS:  abnormal imaging Thickened stomach on CT scan.     Abdominal pain, weight loss after a gastroenteritis.           MEDICATIONS:   Fentanyl 50 mcg IV, Versed 5 mg IV     TOPICAL ANESTHETIC:  Exactacain Spray           DESCRIPTION OF PROCEDURE:   After the risks benefits and     alternatives of the procedure were thoroughly explained, informed     consent was obtained.  The [facility] [id number] endoscope was     introduced through the mouth and advanced to the second portion of     the duodenum, without limitations.  The instrument was slowly     withdrawn as the mucosa was fully examined.     <<PROCEDUREIMAGES>>           Inflammation was found in the distal esophagus. Suspected, ?     exudate/coating vs. actual inflammation in 1-2 cm distal     esophagus. Multiple biopsies were obtained and sent to pathology.     Moderate gastritis was found in the total stomach. Patchy  erythema     and erosions scattered throughout. Multiple biopsies were obtained     and sent to pathology.  Duodenitis was found in the bulb and     descending duodenum. Red spots, erythema. Multiple biopsies were     obtained and sent to pathology.  Otherwise the examination was     normal.    Retroflexed views revealed Retroflexion exam     demonstrated findings as previously described.    The scope was     then withdrawn from the patient and the  procedure completed.           COMPLICATIONS:  None           ENDOSCOPIC IMPRESSION:     1) Inflammation suspected in the distal esophagus     2) Moderate erosive  gastritis in the total stomach     3) Duodenitis in the bulb/descending duodenum     4) Otherwise normal examination     RECOMMENDATIONS:     Start omeprazole 40 mg before breakfast and await pathology for     next step(s).           REPEAT EXAM:  In for as needed.           Iva Boop, MD, Clement[facility] Graham           CC:  Michele Mcalpine, MD     The Patient  n.     eSIGNED:   Iva Boop at 09/07/2010 11:50 AM           [name], [id number]  Note: An exclamation mark (!) indicates a result that was not dispersed into the flowsheet. Document Creation Date: 09/07/2010 11:50 AM _______________________________________________________________________  (1) Order result status: Final Collection or observation date-time: 09/07/2010 11:37 Requested date-time:  Receipt date-time:  Reported date-time:  Referring Physician:   Ordering Physician: Stan Head [phone]) Specimen Source:  Source: Launa Grill Order Number: [id number] Lab site:   Appended Document: Upper Endoscopy   EGD  Procedure date:  09/07/2010  Findings:          1) Inflammation suspected in the distal esophagus     2) Moderate erosive  gastritis in the total stomach     3) Duodenitis in the bulb/descending duodenum     4) Otherwise normal examination     RECOMMENDATIONS:     Start omeprazole 40 mg before breakfast and await pathology for     next step(s).  1. Stomach, biopsy,  :  - BENIGN GASTRIC MUCOSA WITH FOCAL EROSION ASSOCIATED WITH MILD ACTIVE INFLAMMATION AND REACTIVE EPITHELIAL CHANGES. NO INTESTINAL METAPLASIA OR HELICOBACTER PYLORI ORGANISMS IDENTIFIED.   2. Duodenum, biopsy,  :  - BENIGN DUODENAL MUCOSA WITH MILD ACTIVE DUODENITIS, SEE COMMENT. 3. Esophagus, biopsy,  :  - FINDINGS CONSISTENT WITH GASTROESOPHAGEAL  REFLUX.NO INTESTINAL METAPLASIA , DYSPLASIA OR MALIGNANCY IDENTIFIED.

## 2010-11-17 NOTE — Assessment & Plan Note (Signed)
Summary: rov 2 wks ///kp   CC:  2 week follow up for diarrhea. .  History of Present Illness: 70 y/o WM here with known hx hyperlipidemia    ~  Mar 01, 2009:  he has been on HCTZ and Potassium Citrate from Urology due to a hx of kidney stones- last in 2002... he is inclined to stop this medication at present and see how he does... he was followed by DrEvans and he will contact him at [facility] for f/u appt...  states feeling well overall, due for f/u of Chol & DM...  August 06, 2009--Presents for an acute office visit. Complains of sore throat, chest congestion with yellow mucus production, HA, increased fatigue, some wheezing and SOB x1week. Used otc without help.      ~  December 08, 2009:  he has been feeling well- no new complaints or concerns... he is still taking the HCTZ but stopped his KCl- I refilled both & asked him to be sure he takes the K Bid if taking the diuretic... he never did f/u w/ DrEvans & prefers referral to Urology in [place]- I offered to wean his HCTZ/ KCl meds but her prefers to talk w/ Urology 1st & we will set this up... he is still smoking a few- mostly while golfing, none at home, est ~ 1ppwk... f/u CXR today is clear, he knows he needs to quit entirely...  he had f/u colonoscopy 7/10 by DrGessner w/ several adenomatous polyps removed- f/u planned 34yrs...  February 09, 2010 --Presents for an acute office visit. Complains of sore throat, prod cough with green/brown mucus, wheezing, SOB, f/c/s, HA x3days - was given doxycycline last ov, states this helped. OTC not helping.     July 13, 2010 --Presents for an acute office visit. Complains of recurrent stomach issues. He says he got sick 2.5 weeks w/ n/v/d x 1 day resolved on own. then reoccured 2 days later w/ 1 day of n/v/d. since then soft stools, nausea. No diarrhea . BM and urgency after eating. Marland Kitchenlost weight. appetite is down. He took cipro x 5 days (has left over for travel), finished 4 days ago.  Symptoms some better. No  severe abdominal pain. Some cramping w/ BM . NO bloody stools. NO fever.  Dx-Gastritis>>rx diet, zofran, labs unrevealing, PPI rx.   July 27, 2010--Presents for follow up. Last ov tx for suspected gastritis. Labs w/ ESR, CBC, CMET, amylase/lipase were normal. Tx w/ PPI , bland diet and zofran. He is some better w/ improved appetite and resolved vomitting. However upset stomache w/ loose stools and bowel urgency remian. Worse after eating. Feels like when he eats he has to go to [place] 45 min later. He lost 10lbs all together Prior to gastritis he weighed 170lbs. Now down to 160-162lbs. He has had no recent travel. Used Cipro initially as above but no other abx. No bloody stools. No abd pain except for cramping with BMs.  BS are averaging 70-130 fasting. No new meds. Stools are lose at times. Stool frequency has decreased alot. On average 1-2 times a day. BM urgency does not happen everyday. No prior episodes. Last colon was in 2010 w/ polyps removed. plan for repeat in 3 years per Dr. Leone Payor. Denies chest pain, dyspnea, orthopnea, hemoptysis, fever, n/v, edema, headache,recent travel or antibiotics.   Current Medications (verified): 1)  Adult Aspirin Ec Low Strength 81 Mg  Tbec (Aspirin) .... Take 1 Tablet By Mouth Once A Day 2)  Hydrochlorothiazide 50 Mg  Tabs (Hydrochlorothiazide) .... Take 1/2 Tablet By Mouth Once A Day 3)  Potassium Citrate 1080 Mg Cr-Tabs (Potassium Citrate) .... Take 1 Tablet By Mouth Once A Day 4)  Simvastatin 20 Mg  Tabs (Simvastatin) .... Take 1 By Mouth At Bedtime 5)  Lopid 600 Mg  Tabs (Gemfibrozil) .... Take 1 Tablet By Mouth Once A Day 6)  Metformin Hcl 500 Mg Xr24h-Tab (Metformin Hcl) .... Take 1 Tab By Mouth Once Daily.Marland KitchenMarland Kitchen 7)  Viagra 100 Mg Tabs (Sildenafil Citrate) .... Use As Directed... 8)  Ondansetron Hcl 4 Mg Tabs (Ondansetron Hcl) .Marland Kitchen.. 1 By Mouth Every 8 Hr As Needed Nausea 9)  Fibertab 625 Mg Tabs (Calcium Polycarbophil) .... Take One By Mouth Two Times A  Day  Allergies (verified): No Known Drug Allergies  Comments:  Nurse/Medical Assistant: The patient's medications and allergies were reviewed with the patient and were updated in the Medication and Allergy Lists.  Vital Signs:  Patient profile:   70 year old male Height:      71 inches Weight:      162.50 pounds BMI:     22.75 O2 Sat:      98 % on Room air Temp:     96.7 degrees F oral Pulse rate:   89 / minute BP sitting:   110 / 80  (right arm) Cuff size:   regular  O2 Flow:  Room air  Physical Exam  Additional Exam:  WD, WN, 70 y/o WM in NAD... GENERAL:  Alert & oriented; pleasant & cooperative. HEENT:  [place]/AT,sclera nonicteric ,  EOM-wnl, PERRLA, EACs-clear, TMs-wnl, NOSE-clear, THROAT-clear & wnl. NECK:  Supple w/ decrROM; no JVD; normal carotid impulses w/o bruits; no thyromegaly or nodules palpated; no lymphadenopathy. CHEST:  Clear to P & A; without wheezes/ rales/ or rhonchi. HEART:  Regular Rhythm; without murmurs/ rubs/ or gallops. ABDOMEN:  Soft & nontender; normal bowel sounds; no organomegaly or masses detected, no guarding or rebound, neg CVA symptoms.  EXT: without deformities, mild arthritic changes; no varicose veins/ venous insuffic/ or edema. NEURO:   intact DERM:  No lesions noted; no rash etc...     Impression & Recommendations:  Problem # 1:  GASTRITIS (ICD-535.50)  Slowly improving gastritis/colitis . Pt has lost 10 lbs total with no weight loss since last visit.  Labs reviewed and are unrevealing. May be component of IBS vs adverse med effect w/ recent gastroenteritis.  Will try several of the below options , if not improving will check abdominal US and/or refer to GI.  Plan  Advance bland diet as tolerated.  small frequent meals.  Zofran 4mg  every 8 hr as needed nausea Begin Prilosec 20mg  once daily before meal.  Gas x w/ meals.  Levsin three times a day as needed abdominal cramping or bloating.  Stop Metformin.  Check sugars daily  before meals  and record on log.  Call if sugars greater than 250.   Stop HCTZ and Potassium, Aspirin for 1 week then restart.  Stop Lopid and Simvastatin for 2 weeks then restart.  follow up 3 weeks NP Jules Vidovich and as needed  Return stool cards.  Please contact office for sooner follow up if symptoms do not improve or worsen  His updated medication list for this problem includes:    Levsin 0.125 Mg Tabs (Hyoscyamine sulfate) .Marland Kitchen... 1 three times a day as needed abdominal cramping  Orders: Est. Patient Level IV ([address])  Problem # 2:  DIABETES MELLITUS (ICD-250.00) will hold metformin for now.  bs at home doing well.  check labs on return w/ A1C.  BS log on return.  to call if bs >250.  The following medications were removed from the medication list:    Metformin Hcl 500 Mg Xr24h-tab (Metformin hcl) .Marland Kitchen... Take 1 tab by mouth once daily... His updated medication list for this problem includes:    Adult Aspirin Ec Low Strength 81 Mg Tbec (Aspirin) .Marland Kitchen... Take 1 tablet by mouth once a day  Labs Reviewed: Creat: 1.0 (07/13/2010)    Reviewed HgBA1c results: 6.5 (02/28/2009)  6.9 (09/25/2007)  Medications Added to Medication List This Visit: 1)  Fibertab 625 Mg Tabs (Calcium polycarbophil) .... Take one by mouth two times a day 2)  Levsin 0.125 Mg Tabs (Hyoscyamine sulfate) .Marland Kitchen.. 1 three times a day as needed abdominal cramping  Complete Medication List: 1)  Adult Aspirin Ec Low Strength 81 Mg Tbec (Aspirin) .... Take 1 tablet by mouth once a day 2)  Hydrochlorothiazide 50 Mg Tabs (Hydrochlorothiazide) .... Take 1/2 tablet by mouth once a day 3)  Potassium Citrate 1080 Mg Cr-tabs (Potassium citrate) .... Take 1 tablet by mouth once a day 4)  Simvastatin 20 Mg Tabs (Simvastatin) .... Take 1 by mouth at bedtime 5)  Lopid 600 Mg Tabs (Gemfibrozil) .... Take 1 tablet by mouth once a day 6)  Viagra 100 Mg Tabs (Sildenafil citrate) .... Use as directed... 7)  Ondansetron Hcl 4 Mg Tabs  (Ondansetron hcl) .Marland Kitchen.. 1 by mouth every 8 hr as needed nausea 8)  Fibertab 625 Mg Tabs (Calcium polycarbophil) .... Take one by mouth two times a day 9)  Levsin 0.125 Mg Tabs (Hyoscyamine sulfate) .Marland Kitchen.. 1 three times a day as needed abdominal cramping  Patient Instructions: 1)  Advance bland diet as tolerated.  2)  small frequent meals.  3)  Zofran 4mg  every 8 hr as needed nausea 4)  Begin Prilosec 20mg  once daily before meal.  5)  Gas x w/ meals.  6)  Levsin three times a day as needed abdominal cramping or bloating.  7)  Stop Metformin.  8)  Check sugars daily before meals  and record on log.  9)  Call if sugars greater than 250.   10)  Stop HCTZ and Potassium, Aspirin for 1 week then restart.  11)  Stop Lopid and Simvastatin for 2 weeks then restart.  12)  follow up 3 weeks NP Ryatt Corsino and as needed  13)  Return stool cards.  14)  Please contact office for sooner follow up if symptoms do not improve or worsen  Prescriptions: LEVSIN 0.125 MG TABS (HYOSCYAMINE SULFATE) 1 three times a day as needed abdominal cramping  #45 x 1   Entered and Authorized by:   Rubye Oaks NP   Signed by:   Anina Schnake NP on 07/27/2010   Method used:   Electronically to        [facility]. [address] [phone]* (retail)       [address]       [place], [place]  [address]       Ph: [phone] or [phone]       Fax: [phone]   RxID:   [id number]

## 2010-11-17 NOTE — Progress Notes (Signed)
Summary: form  Phone Note From Other Clinic Call back at [phone]   Caller: Patient Call For: Khair Chasteen Caller: [facility]/ vickie Call For: Aveion Nguyen Summary of Call: calling to see if form for diabetic supplies was received Initial call taken by: Rickard Patience,  November 19, 2009 10:24 AM  Follow-up for Phone Call        Marliss Czar, have you seen any forms? Thanks. Carron Curie CMA  November 19, 2009 10:27 AM    SN has the forms and once they have been signed they will be faxed. Randell Loop CMA  November 19, 2009 3:12 PM

## 2010-11-17 NOTE — Miscellaneous (Signed)
Summary: flu vaccine   Clinical Lists Changes  Observations: Added new observation of FLU VAX: Historical (07/30/2010 16:44)      Immunization History:  Influenza Immunization History:    Influenza:  historical (07/30/2010)

## 2010-11-17 NOTE — Miscellaneous (Signed)
  Clinical Lists Changes  Medications: Added new medication of OMEPRAZOLE 40 MG  CPDR (OMEPRAZOLE) 1 each day 30 minutes before meal - Signed Rx of OMEPRAZOLE 40 MG  CPDR (OMEPRAZOLE) 1 each day 30 minutes before meal;  #30 x 1;  Signed;  Entered by: Iva Boop MD, Clementeen Graham;  Authorized by: Iva Boop MD, FACG;  Method used: Electronically to [facility]. [address]. [phone]*, [address], [place], [place], [place]  [address], Ph: [phone] or [phone], Fax: [phone]    Prescriptions: OMEPRAZOLE 40 MG  CPDR (OMEPRAZOLE) 1 each day 30 minutes before meal  #30 x 1   Entered and Authorized by:   Iva Boop MD, [facility]   Signed by:   Iva Boop MD, FACG on 09/07/2010   Method used:   Electronically to        [facility] [address] [phone]* (retail)       [address]       [place], [place]  [address]       Ph: [phone] or [phone]       Fax: [phone]   RxID:   [id number]

## 2010-11-17 NOTE — Assessment & Plan Note (Signed)
Summary: Acute NP office - bronchitis   CC:  sore throat, prod cough with green/brown mucus, wheezing, SOB, f/c/s, HA x3days - was given doxycycline last ov, and states this helped.  History of Present Illness: 70 y/o WM here with known hx hyperlipidemia    ~  Mar 01, 2009:  he has been on HCTZ and Potassium Citrate from Urology due to a hx of kidney stones- last in 2002... he is inclined to stop this medication at present and see how he does... he was followed by DrEvans and he will contact him at [facility] for f/u appt...  states feeling well overall, due for f/u of Chol & DM...  August 06, 2009--Presents for an acute office visit. Complains of sore throat, chest congestion with yellow mucus production, HA, increased fatigue, some wheezing and SOB x1week. Used otc without help. Denies chest pain, dyspnea, orthopnea, hemoptysis, fever, n/v/d, edema, headache,recent travel or antibiotics.    ~  December 08, 2009:  he has been feeling well- no new complaints or concerns... he is still taking the HCTZ but stopped his KCl- I refilled both & asked him to be sure he takes the K Bid if taking the diuretic... he never did f/u w/ DrEvans & prefers referral to Urology in [place]- I offered to wean his HCTZ/ KCl meds but her prefers to talk w/ Urology 1st & we will set this up... he is still smoking a few- mostly while golfing, none at home, est ~ 1ppwk... f/u CXR today is clear, he knows he needs to quit entirely...  he had f/u colonoscopy 7/10 by DrGessner w/ several adenomatous polyps removed- f/u planned 63yrs...  February 09, 2010 --Presents for an acute office visit. Complains of sore throat, prod cough with green/brown mucus, wheezing, SOB, f/c/s, HA x3days - was given doxycycline last ov, states this helped. OTC not helping.  Denies chest pain, dyspnea, orthopnea, hemoptysis, fever, n/v/d, edema, headache,recent travel or antibiotics.   Medications Prior to Update: 1)  Adult Aspirin Ec Low Strength 81 Mg  Tbec  (Aspirin) .... Take 1 Tablet By Mouth Once A Day 2)  Hydrochlorothiazide 50 Mg  Tabs (Hydrochlorothiazide) .... Take 1 Tablet By Mouth Once A Day 3)  Potassium Citrate 1080 Mg Cr-Tabs (Potassium Citrate) .... Take 2 Tablets By Mouth Daily 4)  Simvastatin 20 Mg  Tabs (Simvastatin) .... Take 1 By Mouth At Bedtime 5)  Lopid 600 Mg  Tabs (Gemfibrozil) .... Take 1 Tablet By Mouth Once A Day 6)  Metformin Hcl 500 Mg Xr24h-Tab (Metformin Hcl) .... Take 1 Tab By Mouth Once Daily.Marland KitchenMarland Kitchen 7)  Viagra 100 Mg Tabs (Sildenafil Citrate) .... Use As Directed...  Current Medications (verified): 1)  Adult Aspirin Ec Low Strength 81 Mg  Tbec (Aspirin) .... Take 1 Tablet By Mouth Once A Day 2)  Hydrochlorothiazide 50 Mg  Tabs (Hydrochlorothiazide) .... Take 1/2 Tablet By Mouth Once A Day 3)  Potassium Citrate 1080 Mg Cr-Tabs (Potassium Citrate) .... Take 1 Tablet By Mouth Once A Day 4)  Simvastatin 20 Mg  Tabs (Simvastatin) .... Take 1 By Mouth At Bedtime 5)  Lopid 600 Mg  Tabs (Gemfibrozil) .... Take 1 Tablet By Mouth Once A Day 6)  Metformin Hcl 500 Mg Xr24h-Tab (Metformin Hcl) .... Take 1 Tab By Mouth Once Daily.Marland KitchenMarland Kitchen 7)  Viagra 100 Mg Tabs (Sildenafil Citrate) .... Use As Directed...  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) Hx of NECK PAIN (ICD-723.1) Hx of BACK PAIN, LUMBAR (ICD-724.2)  Past Surgical History: Last updated: 12/08/2009 S/P CSpine surg in 1986 & 1995 S/P UPPP surgery for snoring 2001 by DrShoemaker S/P cystoscopy/ TURP & stone removal 11/03 by  DrEvans S/P dental implants in 2007 S/P L3-4 microdiscectomy 12/07 by DrPool  Family History: Last updated: 07-Mar-2009 Father died age 6 from DM w/ complications Mother alive age 21 w/ hx of colon cancer (?she had met dis and chemoRx) 3 Siblings: 1 Brother  2 Sisters  Social History: Last updated: 08/06/2009 Married Children +smoker - 1-3 cigs daily social alcohol retired from [employer]  Risk Factors: Smoking Status: current (08/06/2009) Packs/Day: 1-3 cigs daily (08/06/2009)  Review of Systems      See HPI  Vital Signs:  Patient profile:   70 year old male Height:      71 inches Weight:      178 pounds BMI:     24.92 O2 Sat:      97 % on Room air Temp:     97.1 degrees F oral Pulse rate:   65 / minute BP sitting:   112 / 72  (left arm) Cuff size:   regular  Vitals Entered By: Boone Master CNA (February 09, 2010 4:05 PM)  O2 Flow:  Room air CC: sore throat, prod cough with green/brown mucus, wheezing, SOB, f/c/s, HA x3days - was given doxycycline last ov, states this helped Is Patient Diabetic? Yes Comments Medications reviewed with patient Daytime contact number verified with patient. Boone Master CNA  February 09, 2010 4:05 PM    Physical Exam  Additional Exam:  WD, WN, 70 y/o WM in NAD... GENERAL:  Alert & oriented; pleasant & cooperative. HEENT:  [place]/AT, EOM-wnl, PERRLA, EACs-clear, TMs-wnl, NOSE-clear, THROAT-clear & wnl. NECK:  Supple w/ decrROM; no JVD; normal carotid impulses w/o bruits; no thyromegaly or nodules palpated; no lymphadenopathy. CHEST:  Clear to P & A; without wheezes/ rales/ or rhonchi. HEART:  Regular Rhythm; without murmurs/ rubs/ or gallops. ABDOMEN:  Soft & nontender; normal bowel sounds; no organomegaly or masses detected. EXT: without deformities, mild arthritic changes; no varicose veins/ venous insuffic/ or edema. NEURO:   intact DERM:  No lesions noted; no rash etc...     Impression & Recommendations:  Problem # 1:  COPD  (ICD-496) Flare REC Avelox 400mg  once daily for 7 days Mucinex DM two times a day as needed cough/congestion  Increase fluids.  Please contact office for sooner follow up if symptoms do not improve or worsen  NO SMOKING   Medications Added to Medication List This Visit: 1)  Hydrochlorothiazide 50 Mg Tabs (Hydrochlorothiazide) .... Take 1/2 tablet by mouth once a day 2)  Potassium Citrate 1080 Mg Cr-tabs (Potassium citrate) .... Take 1 tablet by mouth once a day 3)  Avelox 400 Mg Tabs (Moxifloxacin hcl) .... By mouth daily 4)  Hydromet 5-1.5 Mg/49ml Syrp (Hydrocodone-homatropine) .Marland Kitchen.. 1-2 tsp every 4-6 hr as needed cough  Other Orders: Est. Patient Level IV ([address]) Prescription Created Electronically [phone])  Patient Instructions: 1)  Avelox 400mg  once daily for 7 days 2)  Mucinex DM two times a day as needed cough/congestion  3)  Increase fluids.  4)  Please contact office for sooner follow up if symptoms do not  improve or worsen  5)  NO SMOKING  Prescriptions: HYDROMET 5-1.5 MG/5ML SYRP (HYDROCODONE-HOMATROPINE) 1-2 tsp every 4-6 hr as needed cough  #8 oz x 0    Entered and Authorized by:   Rubye Oaks NP   Signed by:   Joannie Medine NP on 02/09/2010   Method used:   Print then Give to Patient   RxID:   [id number] AVELOX 400 MG  TABS (MOXIFLOXACIN HCL) By mouth daily  #7 x 0   Entered and Authorized by:   Rubye Oaks NP   Signed by:   Reco Shonk NP on 02/09/2010   Method used:   Electronically to        [facility]. [address] [phone]* (retail)       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

## 2010-11-17 NOTE — Assessment & Plan Note (Signed)
Summary: INTESTINAL ISSUES/LA   CC:  7 month ROV & add-on for "stomach issues"....  History of Present Illness: 70 y/o WM here for a follow up visit... he has multiple medical problems as noted below...    ~  Mar 01, 2009:  he has been on HCTZ and Potassium Citrate from Urology due to a hx of kidney stones- last in 2002... he is inclined to stop this medication at present and see how he does... he was followed by DrEvans and he will contact him at [facility] for f/u appt...  states feeling well overall, due for f/u of Chol & DM...   ~  December 08, 2009:  he has been feeling well- no new complaints or concerns... he is still taking the HCTZ but stopped his KCl- I refilled both & asked him to be sure he takes the K Bid if taking the diuretic... he never did f/u w/ DrEvans & prefers referral to Urology in [place]- I offered to wean his HCTZ/ KCl meds but her prefers to talk w/ Urology 1st & we will set this up... he is still smoking a few- mostly while golfing, none at home, est ~ 1ppwk... f/u CXR today is clear, he knows he needs to quit entirely...  he had f/u colonoscopy 7/10 by DrGessner w/ several adenomatous polyps removed- f/u planned 23yrs...   ~  August 17, 2010:  started w/ GI symptoms in Sept w/ ?gastroent (n/v/d), treated w/ Cipro 7 improved but weak... persist abd cramping, loose stool after eating, gas/ rumbling, & weight loss documented at 17# down to 163#... he takes organic fiber tabs, Levsin helps some, Metformin was stopped & BS betw 80-160 since then... no epig pain or reflux symtoms, [place] beach fre[place], swims in ocean & sound, wonders about Giardia after checking his symptoms on the internet... we decided to check stool studies, CT Abd, Rx w/ flagyl, & request further GI eval from DrGessner.    Current Problems:   S/P ENT SURG by DrShoemaker 2000 for SNORING- ?UPPP w/ resolution of snoring problem...  COPD (ICD-496) - smoker w/ hx of intermittent bronchitic problems, no  chronic dyspnea, cough, sputum, etc... not on regular meds and hasn't required antibiotics in quite some time...  ~  PFT's 2000 showed FVC= 3.33 (74%), FEV1= 1.76 (45%), %1sec= 52, mid-flows= 34% pred.  ~  baseline CXR w/ sl hyperinflation, clear, NAD.Marland Kitchen.  ~  f/u CXR 2/11 shows no acute changes...  CIGARETTE SMOKER (ICD-305.1) - still smokes on occas but not regularly per pt- eg. [place] the golf [place]... he was able to decr smoking w/ Chantix Rx... he knows that he needs to discontinue all smoking.  HYPERTENSION (ICD-401.9) - diet controlled, but he was placed on HCTZ by DrEvans for kidney stone prevention in about 2003, and he wants to stop this now... asked to monitor BP at home w/ digital cuff...  BP today = 120/74> denies HA, fatigue, visual changes, CP, palipit, dizziness, syncope, dyspnea, edema, etc...   HYPERLIPIDEMIA (ICD-272.4) - on SIMVASTATIN 20mg /d & LOPID 600mg /d... his TG level was [phone] range in 1995-6 w/ Gemfibrizol started then...  ~  FLP 1/07 on Lopid showed TChol 207, TG 77, HDL 41, LDL 147... rec> Simva20 added.  ~  FLP 11/07 showed TChol 141, TG 82, HDL 37, LDL 87  ~  FLP 7/08 showed TChol 157, TG 99, HDL 44, LDL 94  ~  FLP 5/10 showed TChol 133, TG 97, HDL 43, LDL 70  ~  FLP 2/11 showed TChol 139, TG 105, HDL 47, LDL 71  DIABETES MELLITUS (ICD-250.00) - + FamHx w/ DM in father... pt diet controlled in past- but in 2007 he took Medrol Rx from [employer], Neurosurg for LBP and developed problems while on a mission trip to [place]... Metformin started then and currently taking METFORMIN 500mg  Qam...  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   DIVERTICULOSIS OF COLON (ICD-562.10) COLONIC POLYPS (ICD-211.3) - there is a pos family hx of colon  cancer in his mother...  ~  colonoscopy 7/04 showed divertics, hems, no polyps... f/u planned 5 yrs.  ~  f/u colon 7/10 DrGessner w/ 6 polyps removed- max 8mm- mult tubular adenomas removed & f/u planned 15yrs. HEMORRHOIDS (ICD-455.6)  RENAL CALCULUS, HX OF (ICD-V13.01) - on HCTZ 50mg /d and Urocit-K Bid per DrEvans... he wants to stop these meds but prefers to have Urology appt 1st to discuss this & we will set this up for him.  ~  s/p right ureteroscopy 2002 by DrEvans for stone...  BENIGN PROSTATIC HYPERTROPHY, HX OF (ICD-V13.8) - s/p TURP & removal of bladder stone in 2003 by DrEvans...  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 Hx of NECK PAIN (ICD-723.1) - s/p CSpine surg x2 in 1986 & 1995... Hx of BACK PAIN, LUMBAR (ICD-724.2) - s/p Lumbar microdiscectomy 12/07 by DrPool...  Health Maintenance:  ~  GI= DrGessner  ~  GU= prev DrEvans & we will refer to [facility] per his request... DRE neg, PSA pending.  ~  Immunizations:  gets yearly Flu vaccine... Tetanus? probably before his [place] trip in 2006... PNEUMOVAX given 2/11.   Preventive Screening-Counseling & Management  Alcohol-Tobacco     Smoking Status: current     Packs/Day: 1-3 cigs daily  Allergies (verified): No Known Drug Allergies  Comments:  Nurse/Medical Assistant: The patient's medications and allergies were reviewed with the patient and were updated in the Medication and Allergy Lists.  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 Father died age 66 from DM w/ complications Mother alive age 62 w/ hx of colon cancer (?she had met dis and chemoRx) 3 Siblings:  1 Brother, 2 Sisters  Social History: Reviewed history from 08/06/2009 and no changes required. Married Children +smoker - 1-3 cigs daily social alcohol retired from [employer]  Review of Systems      See HPI       The patient complains of weight loss and abdominal pain.  The patient denies anorexia, fever, weight gain, vision  loss, decreased hearing, hoarseness, chest pain, syncope, dyspnea on exertion, peripheral edema, prolonged cough, headaches, hemoptysis, melena, hematochezia, severe indigestion/heartburn, hematuria, incontinence, muscle weakness, suspicious skin lesions, transient blindness, difficulty walking, depression, unusual weight change, abnormal bleeding, enlarged lymph nodes, and angioedema.    Vital Signs:  Patient profile:   70 year old male Height:      71 inches Weight:      163.50 pounds BMI:     22.89 O2 Sat:      97 % on Room air Pulse rate:   67 / minute BP sitting:   120 / 74  (left arm) Cuff size:   regular  Vitals Entered By: Randell Loop CMA (August 17, 2010 11:09 AM)  O2 Sat at Rest %:  97 O2 Flow:  Room air CC: 7 month ROV & add-on for "stomach issues"... Is Patient Diabetic? Yes Pain Assessment Patient in pain? yes      Onset of pain  abd cramping Comments meds updated today with pt   Physical Exam  Additional Exam:  WD, WN, 70 y/o WM in NAD... GENERAL:  Alert & oriented; pleasant & cooperative... HEENT:  [place]/AT, EOM-wnl, PERRLA, EACs-clear, TMs-wnl,  NOSE-clear, THROAT-clear & wnl. NECK:  Supple w/ decrROM; no JVD; normal carotid impulses w/o bruits; no thyromegaly or nodules palpated; no lymphadenopathy. CHEST:  Clear to P & A; without wheezes/ rales/ or rhonchi heard... HEART:  Regular Rhythm; without murmurs/ rubs/ or gallops detected... ABDOMEN:  Soft & nontender; normal bowel sounds; no organomegaly or masses palpated... EXT: without deformities, mild arthritic changes; no varicose veins/ venous insuffic/ or edema. NEURO: CNs intact, no focal neuro deficits... DERM:  No lesions noted; no rash etc...    Impression & Recommendations:  Problem # 1:  WEIGHT LOSS (ICD-783.21) He has persist GI symptoms (cramping, loose stool, gas, some nausea) assoc w/ weight loss & needs further eval >> we discussed checking stool studies, CT Abd for completenesss, & refer to GI for further eval... we will start empiric Flagyl Rx... he is in agreement w/ this plan for evaluation & treatment... Orders: T-Culture, Stool ([id number]) T-Culture, C-Diff Toxin A/B ([id number]) T-Stool Giardia / Crypto- EIA (09811) T-Stool for O&P ([id number]) Gastroenterology Referral (GI) Radiology Referral (Radiology)  Problem # 2:  COPD (ICD-496) He has underlying COPD, still smokes on occas & understands the imperitive to quit smoking completely... offered Chantix, & discussed nicotine replacement options, etc...  Problem # 3:  HYPERTENSION (ICD-401.9) Controlled>  same Rx for now... (he takes the HCT from Urology for his kidney stones). His updated medication list for this problem includes:    Hydrochlorothiazide 50 Mg Tabs (Hydrochlorothiazide) .Marland Kitchen... Take 1/2 tablet by mouth once a day  Problem # 4:  HYPERLIPIDEMIA (ICD-272.4) Controlled>  continue same for now... His updated medication list for this problem includes:    Simvastatin 20 Mg Tabs (Simvastatin) .Marland Kitchen... Take 1 by mouth at bedtime    Lopid 600 Mg Tabs (Gemfibrozil) .Marland Kitchen... Take 1 tablet by  mouth once a day  Problem # 5:  DIABETES MELLITUS (ICD-250.00) Metformin has been held due to diarrhea & BS monitored at home betw 80-160... His updated medication list for this problem includes:    Adult Aspirin Ec Low Strength 81 Mg Tbec (Aspirin) .Marland Kitchen... Take 1 tablet by mouth once a day  Problem # 6:  RENAL CALCULUS, HX OF (ICD-V13.01) Followed by DrEvans... CT shows several bilat stones in kidneys no obstruction etc...  Problem # 7:  OTHER PROBLEMS AS NOTED>>>  Complete Medication List: 1)  Adult Aspirin Ec Low Strength 81 Mg Tbec (Aspirin) .... Take 1 tablet by mouth once a day 2)  Hydrochlorothiazide 50 Mg Tabs (Hydrochlorothiazide) .... Take 1/2 tablet by mouth once a day 3)  Potassium Citrate 1080 Mg Cr-tabs (Potassium citrate) .... Take 1 tablet by mouth once a day 4)  Simvastatin 20 Mg Tabs (Simvastatin) .... Take 1 by mouth at bedtime 5)  Lopid 600 Mg Tabs (Gemfibrozil) .... Take 1 tablet by mouth once a day 6)  Ondansetron Hcl 4 Mg Tabs (Ondansetron hcl) .Marland Kitchen.. 1 by mouth every 8 hr as needed nausea 7)  Fibertab 625 Mg Tabs (calcium Polycarbophil)**on Hold**  .... Take one by mouth two times a day 8)  Levsin 0.125 Mg Tabs (Hyoscyamine sulfate) .Marland Kitchen.. 1 three times a day as needed abdominal cramping 9)  Metronidazole 500 Mg Tabs (Metronidazole) .... Take 1 tab by mouth three times a day...  Patient Instructions: 1)  Today we updated your med list- see below.... 2)  We decided to check your stool sample for cultures, and proceed w/ a CT scan of your abdominal area...  3)  Start the empiric trial of FLAGYL (Metronidazole) after you collect the specimen... 4)  We will arrange for a follow up appt w/ DrGessner for next week... 5)  Call for any questions... Prescriptions: METRONIDAZOLE 500 MG TABS (METRONIDAZOLE) take 1 tab by mouth three times a day...  #30 x 0   Entered and Authorized by:   Michele Mcalpine MD   Signed by:   Michele Mcalpine MD on 08/17/2010   Method used:   Print then  Give to Patient   RxID:   [id number]

## 2010-11-17 NOTE — Progress Notes (Signed)
Summary: request to be seen  Phone Note Call from Patient   Caller: Patient Call For: [name] Summary of Call: PT REQUESTS TO BE SEEN TODAY OR TOMORROW FOR INTESTINAL ISSUES THAT HE HAS BEEN SEEING TP FOR "OFF AND ON". CALL HIS CELL AS HE HAS JUST LEFT OUR BLG (PT HAD WALKED IN TO BE SEEN). [phone] Initial call taken by: Tivis Ringer, CNA,  August 17, 2010 8:46 AM  Follow-up for Phone Call        PT ADDED ON TO SCHEDULE TODAY--at [address] [facility]  August 17, 2010 8:52 AM

## 2010-11-17 NOTE — Miscellaneous (Signed)
Summary: Flu Vaccine/[facility]  Flu Vaccine/[facility]   Imported By: Esmeralda Links D'jimraou 08/08/2010 10:39:02  _____________________________________________________________________  External Attachment:    Type:   Image     Comment:   External Document

## 2010-11-17 NOTE — Assessment & Plan Note (Signed)
Summary: Acute NP office visit - nausea   CC:  states had n/v/d x2days 3 weeks ago and finished 5day round of cipro on Thursday.  still very nauseated and increased frequency of BMs - soft.  erratic BS ranging from 67-221.Marland Kitchen  History of Present Illness: 70 y/o WM here with known hx hyperlipidemia    ~  Mar 01, 2009:  he has been on HCTZ and Potassium Citrate from Urology due to a hx of kidney stones- last in 2002... he is inclined to stop this medication at present and see how he does... he was followed by DrEvans and he will contact him at [facility] for f/u appt...  states feeling well overall, due for f/u of Chol & DM...  August 06, 2009--Presents for an acute office visit. Complains of sore throat, chest congestion with yellow mucus production, HA, increased fatigue, some wheezing and SOB x1week. Used otc without help. Denies chest pain, dyspnea, orthopnea, hemoptysis, fever, n/v/d, edema, headache,recent travel or antibiotics.    ~  December 08, 2009:  he has been feeling well- no new complaints or concerns... he is still taking the HCTZ but stopped his KCl- I refilled both & asked him to be sure he takes the K Bid if taking the diuretic... he never did f/u w/ DrEvans & prefers referral to Urology in [place]- I offered to wean his HCTZ/ KCl meds but her prefers to talk w/ Urology 1st & we will set this up... he is still smoking a few- mostly while golfing, none at home, est ~ 1ppwk... f/u CXR today is clear, he knows he needs to quit entirely...  he had f/u colonoscopy 7/10 by DrGessner w/ several adenomatous polyps removed- f/u planned 44yrs...  February 09, 2010 --Presents for an acute office visit. Complains of sore throat, prod cough with green/brown mucus, wheezing, SOB, f/c/s, HA x3days - was given doxycycline last ov, states this helped. OTC not helping.     July 13, 2010 --Presents for an acute office visit. Complains of recurrent stomach issues. He says he got sick 2.5 weeks w/ n/v/d x 1 day  resolved on own. then reoccured 2 days later w/ 1 day of n/v/d. since then soft stools, nausea. No diarrhea . BM and urgency after eating. Marland Kitchenlost weight. appetite is down. He took cipro x 5 days (has left over for travel), finished 4 days ago.  Symptoms some better. No severe abdominal pain. Some cramping w/ BM . NO bloody stools. NO fever. Denies chest pain, dyspnea, orthopnea, hemoptysis, fever,  edema, headache.   Medications Prior to Update: 1)  Adult Aspirin Ec Low Strength 81 Mg  Tbec (Aspirin) .... Take 1 Tablet By Mouth Once A Day 2)  Hydrochlorothiazide 50 Mg  Tabs (Hydrochlorothiazide) .... Take 1/2 Tablet By Mouth Once A Day 3)  Potassium Citrate 1080 Mg Cr-Tabs (Potassium Citrate) .... Take 1 Tablet By Mouth Once A Day 4)  Simvastatin 20 Mg  Tabs (Simvastatin) .... Take 1 By Mouth At Bedtime 5)  Lopid 600 Mg  Tabs (Gemfibrozil) .... Take 1 Tablet By Mouth Once A Day 6)  Metformin Hcl 500 Mg Xr24h-Tab (Metformin Hcl) .... Take 1 Tab By Mouth Once Daily.Marland KitchenMarland Kitchen 7)  Viagra 100 Mg Tabs (Sildenafil Citrate) .... Use As Directed... 8)  Avelox 400 Mg  Tabs (Moxifloxacin Hcl) .... By Mouth Daily 9)  Hydromet 5-1.5 Mg/84ml Syrp (Hydrocodone-Homatropine) .Marland Kitchen.. 1-2 Tsp Every 4-6 Hr As Needed Cough  Current Medications (verified): 1)  Adult Aspirin Ec Low Strength 81  Mg  Tbec (Aspirin) .... Take 1 Tablet By Mouth Once A Day 2)  Hydrochlorothiazide 50 Mg  Tabs (Hydrochlorothiazide) .... Take 1/2 Tablet By Mouth Once A Day 3)  Potassium Citrate 1080 Mg Cr-Tabs (Potassium Citrate) .... Take 1 Tablet By Mouth Once A Day 4)  Simvastatin 20 Mg  Tabs (Simvastatin) .... Take 1 By Mouth At Bedtime 5)  Lopid 600 Mg  Tabs (Gemfibrozil) .... Take 1 Tablet By Mouth Once A Day 6)  Metformin Hcl 500 Mg Xr24h-Tab (Metformin Hcl) .... Take 1 Tab By Mouth Once Daily.Marland KitchenMarland Kitchen 7)  Viagra 100 Mg Tabs (Sildenafil Citrate) .... Use As Directed...  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) Hx of NECK PAIN (ICD-723.1) Hx of BACK PAIN, LUMBAR (ICD-724.2)  Past Surgical History: Last updated: 12/08/2009 S/P CSpine surg in 1986 & 1995 S/P UPPP surgery for snoring 2001 by DrShoemaker S/P cystoscopy/ TURP & stone removal 11/03 by DrEvans S/P dental implants in 2007 S/P L3-4 microdiscectomy 12/07 by DrPool  Family History: Last updated: Mar 23, 2009 Father died age 37 from DM w/ complications Mother alive age 30 w/ hx of colon cancer (?she had met dis and chemoRx) 3 Siblings: 1 Brother  2 Sisters  Social History: Last updated: 08/06/2009 Married Children +smoker - 1-3 cigs daily social alcohol retired from [employer]  Risk Factors: Smoking Status: current (08/06/2009) Packs/Day: 1-3 cigs daily (08/06/2009)  Review of Systems      See HPI  Vital Signs:  Patient profile:   70 year old male Height:      71 inches Weight:      162.25 pounds BMI:     22.71 O2 Sat:      98 % on Room air Temp:     97.6 degrees F oral Pulse rate:   81 / minute BP sitting:   108 / 70  (right arm) Cuff size:   regular  Vitals Entered By: Boone Master CNA/MA (July 13, 2010 2:44 PM)  O2 Flow:  Room air CC: states had n/v/d x2days 3 weeks ago, finished 5day round of cipro on Thursday.  still very nauseated and increased frequency of BMs - soft.  erratic BS ranging from 67-221. Is Patient Diabetic? No Comments Medications reviewed with patient Daytime contact number verified with patient. Boone Master CNA/MA  July 13, 2010 2:57 PM    Physical  Exam  Additional Exam:  WD, WN, 70 y/o WM in NAD... GENERAL:  Alert & oriented; pleasant & cooperative. HEENT:  [place]/AT, EOM-wnl, PERRLA, EACs-clear, TMs-wnl, NOSE-clear, THROAT-clear & wnl. NECK:  Supple w/ decrROM; no JVD; normal carotid impulses w/o bruits; no thyromegaly or nodules palpated; no lymphadenopathy. CHEST:  Clear to P & A; without wheezes/ rales/ or rhonchi. HEART:  Regular Rhythm; without murmurs/ rubs/ or gallops. ABDOMEN:  Soft & nontender; normal bowel sounds; no organomegaly or masses detected, no guarding or rebound, neg CVA symptoms.  EXT: without deformities, mild arthritic changes; no varicose veins/ venous insuffic/ or edema. NEURO:   intact DERM:  No lesions noted; no rash etc...     Impression & Recommendations:  Problem # 1:  GASTRITIS (ICD-535.50)  Gastritis w/ diarrhea Plan: labs pending. , if not improving may need to  check ABD US>  Advance bland diet as tolerated.  small frequent meals.  Avoid lactose for 5 days Zofran 4mg  every 8 hr as needed nausea Dexilant 60mg  once daily for 10 days I will call with labs.  follow up 2 weeks  Please contact office for sooner follow up if symptoms do not improve or worsen   Orders: T-Urine Culture (Spectrum Order) [id number]) TLB-BMP (Basic Metabolic Panel-BMET) (80048-METABOL) TLB-CBC Platelet - w/Differential (85025-CBCD) TLB-Hepatic/Liver Function Pnl (80076-HEPATIC) TLB-Amylase (82150-AMYL) TLB-Udip w/ Micro (81001-URINE) TLB-Sedimentation Rate (ESR) (85652-ESR) TLB-Lipase (83690-LIPASE) Est. Patient Level IV (91478)  Medications Added to Medication List This Visit: 1)  Ondansetron Hcl 4 Mg Tabs (Ondansetron hcl) .Marland Kitchen.. 1 by mouth every 8 hr as needed nausea  Complete Medication List: 1)  Adult Aspirin Ec Low Strength 81 Mg Tbec (Aspirin) .... Take 1 tablet by mouth once a day 2)  Hydrochlorothiazide 50 Mg Tabs (Hydrochlorothiazide) .... Take 1/2 tablet by mouth once a day 3)  Potassium Citrate 1080  Mg Cr-tabs (Potassium citrate) .... Take 1 tablet by mouth once a day 4)  Simvastatin 20 Mg Tabs (Simvastatin) .... Take 1 by mouth at bedtime 5)  Lopid 600 Mg Tabs (Gemfibrozil) .... Take 1 tablet by mouth once a day 6)  Metformin Hcl 500 Mg Xr24h-tab (Metformin hcl) .... Take 1 tab by mouth once daily.Marland KitchenMarland Kitchen 7)  Viagra 100 Mg Tabs (Sildenafil citrate) .... Use as directed... 8)  Ondansetron Hcl 4 Mg Tabs (Ondansetron hcl) .Marland Kitchen.. 1 by mouth every 8 hr as needed nausea  Patient Instructions: 1)  Advance bland diet as tolerated.  2)  small frequent meals.  3)  Avoid lactose for 5 days 4)  Zofran 4mg  every 8 hr as needed nausea 5)  Dexilant 60mg  once daily for 10 days 6)  I will call with labs.  7)  follow up 2 weeks  8)  Please contact office for sooner follow up if symptoms do not improve or worsen  Prescriptions: ONDANSETRON HCL 4 MG TABS (ONDANSETRON HCL) 1 by mouth every 8 hr as needed nausea  #20 x 0   Entered and Authorized by:   Rubye Oaks NP   Signed by:   Tammy Parrett NP on 07/13/2010   Method used:   Electronically to        [facility]. [address] [phone]* (retail)       [address]       [place], [place]  [address]       Ph: [phone] or [phone]       Fax: [phone]   RxID:   [id number]

## 2010-11-17 NOTE — Progress Notes (Signed)
Summary: egd bx results and plans, PPI x 2 mos  Phone Note Outgoing Call   Summary of Call: week of 11/28 please call him and let him know inflammation on gastric and esophageal bxs I recommend 2 months of the omeprazole and stop see me if that does not work or he relapses Iva Boop MD, [facility]  September 10, 2010 9:25 AM   Follow-up for Phone Call        pt notified of above.  He is agreeable and should have refills for entire course of treatment. Follow-up by: Francee Piccolo CMA Duncan Dull),  September 16, 2010 9:53 AM

## 2010-11-17 NOTE — Progress Notes (Signed)
Summary: re-fax request-AWAITING SCAN  Phone Note From Other Clinic   Caller: kathy w/ [employer]- diabetic Call For: Joseguadalupe Stan Summary of Call: please re-fax form that was signed by sn re: supplies for pt. new fax # to use is [phone]. kathy's # is [phone] Initial call taken by: Tivis Ringer, CNA,  December 01, 2009 10:31 AM  Follow-up for Phone Call        Marliss Czar, have you already sent this one down to be scanned? Please advise thanks Vernie Murders  December 01, 2009 10:35 AM   this form was sent up front to be faxed back and then will be scanned into emr.  thanks Randell Loop CMA  December 01, 2009 2:54 PM   form refaxed and placed in scan folder at front. Carron Curie CMA  December 02, 2009 10:05 AM

## 2010-11-17 NOTE — Letter (Signed)
Summary: EGD Instructions  [facility]  [address] [place], [place] [address]   Phone: [phone]  Fax: [phone]       [name]    06-12-1941    MRN: [id number]       Procedure Day Dorna Bloom: [name], 09/01/10     Arrival Time: 2:30 PM     Procedure Time: 3:30 PM     Location of Procedure:                    _X_ [facility] (4th Floor)  PREPARATION FOR ENDOSCOPY  On TUESDAY, 09/01/10, THE DAY OF THE PROCEDURE:  1.   No solid foods, milk or milk products are allowed after midnight the night before your procedure.  2.   Do not drink anything colored red or purple.  Avoid juices with pulp.  No orange juice.  3.  You may drink clear liquids until 1:30 PM, which is 2 hours before your procedure.                                                                                                CLEAR LIQUIDS INCLUDE: Water Jello Ice Popsicles Tea (sugar ok, no milk/cream) Powdered fruit flavored drinks Coffee (sugar ok, no milk/cream) Gatorade Juice: apple, white grape, white cranberry  Lemonade Clear bullion, consomm, broth Carbonated beverages (any kind) Strained chicken noodle soup Hard Candy   MEDICATION INSTRUCTIONS  Unless otherwise instructed, you should take regular prescription medications with a small sip of water as early as possible the morning of your procedure.                  OTHER INSTRUCTIONS  You will need a responsible adult at least 68 years of age to accompany you and drive you home.   This person must remain in the waiting room during your procedure.  Wear loose fitting clothing that is easily removed.  Leave jewelry and other valuables at home.  However, you may wish to bring a book to read or an iPod/MP3 player to listen to music as you wait for your procedure to start.  Remove all body piercing jewelry and leave at home.  Total time from sign-in until discharge is approximately 2-3 hours.  You  should go home directly after your procedure and rest.  You can resume normal activities the day after your procedure.  The day of your procedure you should not:   Drive   Make legal decisions   Operate machinery   Drink alcohol   Return to work  You will receive specific instructions about eating, activities and medications before you leave.    The above instructions have been reviewed and explained to me by   [name], CMA (AAMA)     I fully understand and can verbalize these instructions _____________________________ Date 08/19/10

## 2010-12-11 ENCOUNTER — Other Ambulatory Visit: Payer: Self-pay | Admitting: Pulmonary Disease

## 2010-12-11 ENCOUNTER — Other Ambulatory Visit: Payer: Medicare Other

## 2010-12-11 ENCOUNTER — Encounter: Payer: Self-pay | Admitting: Pulmonary Disease

## 2010-12-11 ENCOUNTER — Encounter (INDEPENDENT_AMBULATORY_CARE_PROVIDER_SITE_OTHER): Payer: Medicare Other | Admitting: Pulmonary Disease

## 2010-12-11 ENCOUNTER — Ambulatory Visit (INDEPENDENT_AMBULATORY_CARE_PROVIDER_SITE_OTHER)
Admission: RE | Admit: 2010-12-11 | Discharge: 2010-12-11 | Disposition: A | Discharge status: Home / Self Care | Payer: Medicare Other | Source: Ambulatory Visit | Attending: Pulmonary Disease | Admitting: Pulmonary Disease

## 2010-12-11 DIAGNOSIS — R634 Abnormal weight loss: Secondary | ICD-10-CM

## 2010-12-11 DIAGNOSIS — E119 Type 2 diabetes mellitus without complications: Secondary | ICD-10-CM

## 2010-12-11 DIAGNOSIS — J449 Chronic obstructive pulmonary disease, unspecified: Secondary | ICD-10-CM

## 2010-12-11 DIAGNOSIS — K5730 Diverticulosis of large intestine without perforation or abscess without bleeding: Secondary | ICD-10-CM

## 2010-12-11 DIAGNOSIS — E785 Hyperlipidemia, unspecified: Secondary | ICD-10-CM

## 2010-12-11 DIAGNOSIS — I10 Essential (primary) hypertension: Secondary | ICD-10-CM

## 2010-12-11 DIAGNOSIS — Z87442 Personal history of urinary calculi: Secondary | ICD-10-CM

## 2010-12-11 DIAGNOSIS — F17200 Nicotine dependence, unspecified, uncomplicated: Secondary | ICD-10-CM

## 2010-12-11 DIAGNOSIS — Z125 Encounter for screening for malignant neoplasm of prostate: Secondary | ICD-10-CM

## 2010-12-11 DIAGNOSIS — K449 Diaphragmatic hernia without obstruction or gangrene: Secondary | ICD-10-CM

## 2010-12-11 DIAGNOSIS — Z86010 Personal history of colonic polyps: Secondary | ICD-10-CM

## 2010-12-11 LAB — HEPATIC FUNCTION PANEL
ALT: 14 U/L (ref 0–53)
AST: 16 U/L (ref 0–37)
Alkaline Phosphatase: 93 U/L (ref 39–117)
Bilirubin, Direct: 0.1 mg/dL (ref 0.0–0.3)
Total Bilirubin: 0.6 mg/dL (ref 0.3–1.2)

## 2010-12-11 LAB — CBC WITH DIFFERENTIAL/PLATELET
Basophils Absolute: 0 10*3/uL (ref 0.0–0.1)
Basophils Relative: 0.3 % (ref 0.0–3.0)
Eosinophils Absolute: 0.2 10*3/uL (ref 0.0–0.7)
MCHC: 35 g/dL (ref 30.0–36.0)
MCV: 91.3 fl (ref 78.0–100.0)
Monocytes Absolute: 0.5 10*3/uL (ref 0.1–1.0)
Neutrophils Relative %: 60.8 % (ref 43.0–77.0)
Platelets: 312 10*3/uL (ref 150.0–400.0)
RDW: 12.7 % (ref 11.5–14.6)

## 2010-12-11 LAB — BASIC METABOLIC PANEL
CO2: 30 mEq/L (ref 19–32)
Chloride: 103 mEq/L (ref 96–112)
Potassium: 5.2 mEq/L — ABNORMAL HIGH (ref 3.5–5.1)

## 2010-12-11 LAB — LIPID PANEL
Cholesterol: 122 mg/dL (ref 0–200)
Total CHOL/HDL Ratio: 3
Triglycerides: 103 mg/dL (ref 0.0–149.0)

## 2010-12-11 LAB — PSA: PSA: 0.49 ng/mL (ref 0.10–4.00)

## 2010-12-24 NOTE — Assessment & Plan Note (Signed)
Summary: physical/sh   Primary Care Provider:  Lorin Picket Kohen Reither,MD  CC:  4 month ROV & reviewof mult medical problems....  History of Present Illness: 70 y/o WM here for a follow up visit... he has multiple medical problems including COPD & he still smokes a few cigs daily;  HBP;  Hyperlipidemia;  DM;  HH w/ decr LES;  Divertics & colon polyps;  hx kidney stones/ BPH w/ TURP followed by DrEvans & DrKimbrough;  DJD w/ neck & back pain- s/p lumbar diskectomy by DrPool...   ~  December 08, 2009:  he has been feeling well- no new complaints or concerns... he is still taking the HCTZ but stopped his KCl- I refilled both & asked him to be sure he takes the K Bid if taking the diuretic... he never did f/u w/ DrEvans & prefers referral to Urology in [place]- I offered to wean his HCTZ/ KCl meds but her prefers to talk w/ Urology 1st & we will set this up... he is still smoking a few- mostly while golfing, none at home, est ~ 1ppwk... f/u CXR today is clear, he knows he needs to quit entirely...  he had f/u colonoscopy 7/10 by DrGessner w/ several adenomatous polyps removed- f/u planned 92yrs...   ~  August 17, 2010:  started w/ GI symptoms in Sept w/ ?gastroent (n/v/d), treated w/ Cipro & improved but weak... persist abd cramping, loose stool after eating, gas/ rumbling, & weight loss documented at 17# down to 163#... he takes organic fiber tabs, Levsin helps some, Metformin was stopped & BS betw 80-160 since then... no epig pain or reflux symtoms, [place], swims in ocean & sound, wonders about Giardia after checking his symptoms on the internet... we decided to check stool studies (neg), CT Abd (NAD- bilat nonobstructive kid stones), Rx w/ Flagyl, & request further GI eval from DrGessner> EGD w/ mod erosive gastritis, esophagitis, duodenitis (HPylori neg)- rx OMEP40mg /d.   ~  December 11, 2010:  He notes that his "stomach problems" are "improved but not fixed" but he is off meds & encouraged to  restart Omep40mg /d & f/u w/ DrGessner...  he also notes sl cough, yellow sputum but denies f/c/s, incr SOB/ CP/ etc (we discussed checking CXR- clear, NAD) & rx w/ ZPak)... he had ?migraine HA yest that resolved spont... finally relates prob w/ left shoulder x 6-8wks & rather than rx w/ NSAID which he should avoid, we will refer to DrSypher for eval...    Still smoking 1-3 cig/d & encouraged to quit completely;  BP controlled on diet/ exercise/ & HCTZ perscribed for his kidney stones;  FLP looks good on Simva20 + Lopid600;  DM= fair control on diet alone (he stopped his Metformin on his own) w/ A1c up to 7.3 off the med;  KidStones followed by drKimbrough who agreed w/ weaning the diuretic/ K+ therapy(on HCTZ50- 1/2 daily + one UrorcitK/d...   Current Meds:  ADULT ASPIRIN EC LOW STRENGTH 81 MG  TBEC (ASPIRIN) Take 1 tablet by mouth once a day HYDROCHLOROTHIAZIDE 50 MG  TABS (HYDROCHLOROTHIAZIDE) Take 1/2 tablet by mouth once a day POTASSIUM CITRATE 1080 MG CR-TABS (POTASSIUM CITRATE) Take 1 tablet by mouth once a day SIMVASTATIN 20 MG  TABS (SIMVASTATIN) take 1 by mouth at bedtime LOPID 600 MG  TABS (GEMFIBROZIL) Take 1 tablet by mouth once a day VIAGRA 100 MG TABS (SILDENAFIL CITRATE) use as directed... ZITHROMAX Z-PAK 250 MG TABS (AZITHROMYCIN) take as directed... OMEPRAZOLE 40 MG  CPDR (OMEPRAZOLE) take 1 tab by mouth once daily--- 30 min before a meal.    Preventive Screening-Counseling & Management  Alcohol-Tobacco     Smoking Status: current     Packs/Day: 1-3 cigs daily  Allergies (verified): No Known Drug Allergies  Comments:  Nurse/Medical Assistant: The patient's medications and allergies were reviewed with the patient and were updated in the Medication and Allergy Lists.  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 Father died age 1 from DM w/ complications Mother died at w/ hx of colon cancer (?she had met dis and chemoRx), died of "natural causes" 3 Siblings: 1 Brother  2 Sisters  Social History: Reviewed history from 08/19/2010 and no changes required. Married Children- 2  +smoker - 1-3 cigarettess daily when golfing social alcohol retired from [employer]  Review of Systems       The patient complains of nasal congestion, cough, change in bowel habits, gas/bloating, joint pain, and stiffness.  The patient denies fever, chills, sweats, anorexia, fatigue, weakness, malaise, weight loss, sleep disorder, blurring, diplopia, eye irritation, eye discharge, vision loss, eye pain, photophobia, earache, ear discharge, tinnitus, decreased hearing, nosebleeds, sore throat, hoarseness, chest pain, palpitations, syncope, dyspnea on exertion, orthopnea, PND, peripheral edema, dyspnea at rest, excessive sputum, hemoptysis, wheezing, pleurisy, nausea, vomiting, diarrhea, constipation, abdominal pain, melena, hematochezia, jaundice, indigestion/heartburn, dysphagia, odynophagia, dysuria, hematuria, urinary frequency, urinary hesitancy, nocturia, incontinence, back pain, joint swelling, muscle cramps, muscle weakness, arthritis, sciatica, restless legs, leg pain at night, leg pain with exertion, rash, itching, dryness,  suspicious lesions, paralysis, paresthesias, seizures, tremors, vertigo, transient blindness, frequent falls, frequent headaches, difficulty walking, depression, anxiety, memory loss, confusion, cold intolerance, heat intolerance, polydipsia, polyphagia, polyuria, unusual weight change, abnormal bruising, bleeding, enlarged lymph nodes, urticaria, allergic rash, hay fever, and recurrent infections.    Vital Signs:  Patient profile:   70 year old male Height:      71 inches Weight:      172 pounds BMI:     24.08 O2 Sat:      97 % on Room air Temp:     98.2 degrees F oral Pulse rate:   67 / minute BP sitting:   118 / 80  (left arm) Cuff size:   regular  Vitals Entered By: Randell Loop CMA (December 11, 2010 9:54 AM)  O2 Sat at Rest %:  97 O2 Flow:  Room air CC: 4 month ROV & reviewof mult medical problems... Is Patient Diabetic? No Pain Assessment Patient in pain? yes      Onset of pain  left shoulder pain/still some abd discomfort Comments meds updated today with pt   Physical Exam  Additional Exam:  WD, WN, 70 y/o WM in NAD... GENERAL:  Alert & oriented; pleasant & cooperative... HEENT:  [place]/AT, EOM-wnl, PERRLA, EACs-clear, TMs-wnl, NOSE-clear, THROAT-clear & wnl. NECK:  Supple w/ decrROM; no JVD; normal carotid impulses w/o bruits; no thyromegaly  or nodules palpated; no lymphadenopathy. CHEST:  Clear to P & A; without wheezes/ rales/ or rhonchi heard... HEART:  Regular Rhythm; without murmurs/ rubs/ or gallops detected... ABDOMEN:  Soft & nontender; normal bowel sounds; no organomegaly or masses palpated... EXT: without deformities, mild arthritic changes; no varicose veins/ venous insuffic/ or edema. NEURO: CNs intact, no focal neuro deficits... DERM:  No lesions noted; no rash etc...    Impression & Recommendations:  Problem # 1:  COPD (ICD-496) He knows that he needs to quit completely... he declines offer for smoking cessation counselling or meds... We will Rx his  URI w/ ZPak & he will use Mucinex + Fluids OTC... Orders: T-2 View CXR (71020TC) >> clear, NAD...  Problem # 2:  HYPERTENSION (ICD-401.9) Controlled on diet/ exercise + the HCTZ perscribed primarily for his recurrent kidney stones by Urology... His updated medication list for this problem includes:    Hydrochlorothiazide 50 Mg Tabs (Hydrochlorothiazide) .Marland Kitchen... Take 1/2 tablet by mouth once a day  Orders: 12 Lead EKG (12 Lead EKG) TLB-BMP (Basic Metabolic Panel-BMET) (80048-METABOL) TLB-Hepatic/Liver Function Pnl (80076-HEPATIC) TLB-CBC Platelet - w/Differential (85025-CBCD) TLB-Lipid Panel (80061-LIPID) TLB-TSH (Thyroid Stimulating Hormone) (84443-TSH) TLB-A1C / Hgb A1C (Glycohemoglobin) (83036-A1C) TLB-PSA (Prostate Specific Antigen) (84153-PSA)  Problem # 3:  HYPERLIPIDEMIA (ICD-272.4) FLP looks good on the Simva20 + Lopid 600... His updated medication list for this problem includes:    Simvastatin 20 Mg Tabs (Simvastatin) .Marland Kitchen... Take 1 by mouth at bedtime    Lopid 600 Mg Tabs (Gemfibrozil) .Marland Kitchen... Take 1 tablet by mouth once a day  Problem # 4:  DIABETES MELLITUS (ICD-250.00) He has been on diet &kept his wt down... he stopped his Metformin on his own & A1c now up to 7.3.Marland KitchenMarland Kitchen I ewc restarting Metformin 500/d but he prefers to manage w/ diet alone... His updated medication list for this problem includes:    Adult Aspirin Ec Low Strength 81 Mg Tbec (Aspirin) .Marland Kitchen... Take 1 tablet by mouth once a day  Problem # 5:  HIATAL HERNIA (ICD-553.3) He had signif inflamm changes on EGD 11/11 by DrGessner... pt dtopped his OMEP40 on his own & notes some persist symptoms... rec to restart the Omep40/d & f/u w/ GI... The following medications were removed from the medication list:    Levsin 0.125 Mg Tabs (Hyoscyamine sulfate) .Marland Kitchen... 1 three times a day as needed abdominal cramping    Omeprazole 40 Mg Cpdr (Omeprazole) .Marland Kitchen... 1 each day 30 minutes before meal His updated medication list for this problem  includes:    Omeprazole 40 Mg Cpdr (Omeprazole) .Marland Kitchen... Take 1 tab by mouth once daily--- 30 min before a meal.  Problem # 6:  RENAL CALCULUS, HX OF (ICD-V13.01) Followed by DrKimbrough & weaning the HCTZ + urocitK...  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 His updated medication list for this problem includes:    Adult Aspirin Ec Low Strength 81 Mg Tbec (Aspirin) .Marland Kitchen... Take 1 tablet by mouth once a day  Problem # 8:  OTHER MEDICAL PROBLEMS AS NOTED>>>  Complete Medication List: 1)  Adult Aspirin Ec Low Strength 81 Mg Tbec (Aspirin) .... Take 1 tablet by mouth once a day 2)  Hydrochlorothiazide 50 Mg Tabs (Hydrochlorothiazide) .... Take 1/2 tablet by mouth once a day 3)  Potassium Citrate 1080 Mg Cr-tabs (Potassium citrate) .... Take 1 tablet by mouth once a day 4)  Simvastatin  20 Mg Tabs (Simvastatin) .... Take 1 by mouth at bedtime 5)  Lopid 600 Mg Tabs (Gemfibrozil) .... Take 1 tablet by mouth once a day 6)  Viagra 100 Mg Tabs (Sildenafil citrate) .... Use as directed... 7)  Zithromax Z-pak 250 Mg Tabs (Azithromycin) .... Take as directed... 8)  Omeprazole 40 Mg Cpdr (Omeprazole) .... Take 1 tab by mouth once daily--- 30 min before a meal.  Other Orders: Orthopedic Referral (Ortho)  Patient Instructions: 1)  Today we updated your med list- see below.... 2)  We refilled yourt meds for 2012 & wrote a new perscription for Zithromax to take for upper resp infection.Marland Kitchen 3)  Today we did your follow up CXR, EKG, & FASTING blood work... please call the "phone tree" in a few days for your lab results.Marland KitchenMarland Kitchen 4)  We will set up an appt w/ DrSypher/ Orthopedics for your left shoulder pain.Marland KitchenMarland Kitchen 5)  Call for any problems.Marland KitchenMarland Kitchen 6)  Please schedule a follow-up appointment in 6 months. Prescriptions: OMEPRAZOLE 40 MG CPDR (OMEPRAZOLE) take 1 tab by mouth once daily--- 30 min before a  meal.  #30 x 12   Entered and Authorized by:   Michele Mcalpine MD   Signed by:   Michele Mcalpine MD on 12/11/2010   Method used:   Print then Give to Patient   RxID:   [id number] ZITHROMAX Z-PAK 250 MG TABS (AZITHROMYCIN) take as directed...  #1 pack x 4   Entered and Authorized by:   Michele Mcalpine MD   Signed by:   Michele Mcalpine MD on 12/11/2010   Method used:   Print then Give to Patient   RxID:   [id number] VIAGRA 100 MG TABS (SILDENAFIL CITRATE) use as directed...  #10 x prn   Entered and Authorized by:   Michele Mcalpine MD   Signed by:   Michele Mcalpine MD on 12/11/2010   Method used:   Print then Give to Patient   RxID:   [id number] LOPID 600 MG  TABS (GEMFIBROZIL) Take 1 tablet by mouth once a day  #90 x 4   Entered and Authorized by:   Michele Mcalpine MD   Signed by:   Michele Mcalpine MD on 12/11/2010   Method used:   Print then Give to Patient   RxID:   [id number] SIMVASTATIN 20 MG  TABS (SIMVASTATIN) take 1 by mouth at bedtime  #90 x 4   Entered and Authorized by:   Michele Mcalpine MD   Signed by:   Michele Mcalpine MD on 12/11/2010   Method used:   Print then Give to Patient   RxID:   [id number]

## 2010-12-29 LAB — GLUCOSE, CAPILLARY: Glucose-Capillary: 149 mg/dL — ABNORMAL HIGH (ref 70–99)

## 2011-01-24 LAB — GLUCOSE, CAPILLARY
Glucose-Capillary: 110 mg/dL — ABNORMAL HIGH (ref 70–99)
Glucose-Capillary: 128 mg/dL — ABNORMAL HIGH (ref 70–99)

## 2011-03-05 NOTE — Op Note (Signed)
[name], [name]                        ACCOUNT NO.:  [id number]   MEDICAL RECORD NO.:  [id number]                   PATIENT TYPE:  INP   LOCATION:  U045                                 FACILITY:  [facility]   PHYSICIAN:  Jamison Neighbor, M.D.               DATE OF BIRTH:  1941-05-18   DATE OF PROCEDURE:  09/10/2002  DATE OF DISCHARGE:                                 OPERATIVE REPORT   PREOPERATIVE DIAGNOSES:  1. Benign prostatic hypertrophy with bladder outlet obstruction.  2. Bladder calculus.  3. Possible ureteral calculus.   POSTOPERATIVE DIAGNOSES:  1. Benign prostatic hypertrophy with bladder outlet obstruction.  2. Bladder calculus.  3. Possible ureteral calculus.   OPERATION PERFORMED:  Cystoscopy, right retrograde pyelogram, transurethral  resection of the prostate, removal of bladder calculus.   SURGEON:  Jamison Neighbor, M.D.   ASSISTANT:  Melvyn Novas, M.D.   ANESTHESIA:  General.   COMPLICATIONS:  None.   DRAINS:  66 French 3-way Foley catheter.   INDICATIONS FOR PROCEDURE:  The patient is status post treatment of kidney  stones.  The patient was brought back for follow-up and x-ray showed what  appeared to be a stone in the midureter with a possible stone in the  bladder.  Cystoscopy was performed.  The patient was found to have large  median lobe hypertrophy with lesser lateral lobes.  He had some  trabeculations and a stone in the bladder.  The patient is now to undergo  cysto, TURP, removal of bladder stone and a right retrograde to evaluate  what was thought to be a possible stone in the right ureter.  The patient  understands the risks and benefits of the procedure.  We have specifically  discussed the potential risks for incontinence associated with this  procedure.  He gave full and informed consent.   DESCRIPTION OF PROCEDURE:  After successful induction of general anesthesia,  the patient was placed in a dorsal lithotomy position and prepped  with  Betadine and draped in the usual sterile fashion.  Cystoscopy was performed.  The urethra was visualized in its entirety and found to be normal.  Beyond  the veru montanum there was a large median lobe that flopped into the  bladder like the flapper valve of a toilet.  This appeared to be the source  of the obstruction.  There was moderate degree of lateral lobe hypertrophy.  At the bottom of the bladder, a small stone could be seen.  The two ureteral  orifices were unremarkable in their appearance.  The retrograde study done  on the right hand side demonstrated normal upper tract with no sign of a  filling defect or any obstruction and it is felt that the calcification  noted on plain film most likely was a phlebolith or some other form of  retroperitoneal calcification not within the urinary system.  The patient  underwent resection of  the median lobe beginning at the bladder neck  extending out to the verumontanum.  This was taken down until the circular  fibers were identified.  A very small amount of lateral lobe hypertrophy was  removed.  There was not much anteriorly.  A rectal exam showed that there  was still a little bit of residual tissue posteriorly.  However, The patient  had been advised that we would take extraordinary efforts to keep the  resection minimal in order to eliminate as much as possible any risk of  incontinence.  For that reason, the resection was stopped when it was felt  that he was adequately opened and that he would no longer be obstructed by  the medial lobe.  All chips were irrigated from the bladder.  The stone came  out with the irrigation so it did not require separate fragmentation.  The  prostatic fossa was irrigated, was fulgurated until adequately dry.  The  resectoscope was removed.  There was a prompt flow of outflow.  The 24  [place] 3-way catheter was inserted and placed to straight drainage.  This  irrigated freely.  This was placed on  traction.  The patient tolerated the  procedure well and was taken to the recovery room in good condition.                                                 Jamison Neighbor, M.D.    RJE/MEDQ  D:  09/10/2002  T:  09/10/2002  Job:  [id number]   cc:   Lonzo Cloud. Kriste Basque, M.D. [facility]

## 2011-03-05 NOTE — H&P (Signed)
[name], [name]                        ACCOUNT NO.:  [id number]   MEDICAL RECORD NO.:  [id number]                   PATIENT TYPE:  INP   LOCATION:  Z610                                 [facility]ACILITY:  [facility]   PHYSICIAN:  Jamison Neighbor, M.D.               DATE OF BIRTH:  [birth year]   DATE OF ADMISSION:  09/10/2002  DATE OF DISCHARGE:                                HISTORY & PHYSICAL   SERVICE:  Urology.   ADMITTING DIAGNOSES:  1. Bladder calculus.  2. Benign prostatic hypertrophy with bladder outlet obstruction.  3. History of renal calculi.   HISTORY:  This 70 year old male has a history of kidney stones. The patient  underwent follow-up imaging studies which showed no stones in the kidneys  but did show a possible calcification in the right ureter as well as a  definite bladder stone. This was confirmed by cystoscopy. The patient will  be admitted following removal of bladder stone and TURP to eliminate his  bladder outlet obstruction. The patient understands the risks and benefits  of the procedure and gave full and informed consent prior to admission.   PAST MEDICAL HISTORY:  Remarkable for the stone disease for which he has  been placed on hydrochlorothiazide 50 mg daily. His other medications are  gemfibrozil 600 mg b.i.d., Urocit-K 100 mg b.i.d. as well as Flomax that was  recently started in order to help eliminate bladder outlet obstruction. The  patient does have some dyspnea with exertion, he is known to be a smoker. He  is also known to have mild esophageal reflux.   PAST SURGICAL HISTORY:  His previous surgery includes cervical disk surgery  in 1986 and 1995. He also had a uvula palatoplasty and nasal reconstruction  for snoring but never had problems with sleep apnea.   SOCIAL HISTORY:  Pertinent for 1 1/2 packs of cigarettes a day which he has  done for 30 years. He also drinks modest amounts of alcohol.   REVIEW OF SYMPTOMS:  As described above.   PHYSICAL EXAMINATION:  GENERAL:  The patient is a well-developed, well-  nourished male in no acute distress.   HEENT:  Normocephalic, atraumatic. Cranial nerves II-XII are grossly intact.   NECK:  Supple with adenopathy or thyromegaly.   LUNGS:  Clear.   HEART:  Regular rate and rhythm without murmurs, thrills, gallops, rubs, or  heaves.   ABDOMEN:  Soft, nontender, with no palpable masses, rebound and guarding.   GU:  Unremarkable.   RECTAL:  Shows a 2-3+ benign filling prostate.   EXTREMITIES:  No cyanosis, clubbing or edema.   IMPRESSION:  1. Bladder stone.  2. Benign prostatic hypertrophy with bladder outlet obstruction.  3. Possible ureteral calculus.   PLAN:  Admit following Cysto TURP and removal of bladder calculus.  Jamison Neighbor, M.D.    RJE/MEDQ  D:  09/10/2002  T:  09/10/2002  Job:  [id number]   cc:   Lonzo Cloud. Kriste Basque, M.D. [facility]

## 2011-03-05 NOTE — Op Note (Signed)
NAMEJIGAR, ZIELKE              ACCOUNT NO.:  [id number]   MEDICAL RECORD NO.:  [id number]          PATIENT TYPE:  AMB   LOCATION:  [facility]                         FACILITY:  [facility]   PHYSICIAN:  Henry A. Pool, M.D.    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   PROCEDURE NOTE:  Left L3-4 extraforaminal microdiskectomy.   SURGEON:  Kathaleen Maser. Pool, M.D.   ASSISTANT:  Donalee Citrin, M.D.   ANESTHESIA:  General endotracheal.   INDICATIONS:  Mr. Takagi is a 70 year old male with history of left  lower extremity pain particularly with standing or ambulating.  The pain  is most consistent with a left-sided L3 radiculopathy.  Workup  demonstrates evidence of a progressively enlarging left-sided L3-4  extraforaminal disk herniation with marked compression exiting L3 nerve  root.  The patient counseled as to his options.  He decided proceed with  a left-sided L3-4 extraforaminal microdiskectomy in hopes improving  symptoms.   OPERATIVE NOTE:  Patient taken to operating room, placed table in supine  position.  After adequate level anesthesia achieved, patient turned  prone onto Wilson frame appropriately padded.  The patient lumbar region  was prepped and sterilely draped.  10 blade used to make linear skin  incision overlying what was felt to be the L3-4 level.  This carried  down sharply in midline.  Subperiosteal dissection performed exposing  lamina and facet joints as well as the transverse process of what was  thought to be L3.  Deep self-retaining retractor placed.  Intraoperative  x-rays taken and in fact the exposure had been performed at the L4-5  level.  Retractor was repositioned one level  cephalad.  An  extraforaminal approach then made using high-speed drill and Kerrison  rongeurs removing the lateral aspect of the superior facette of L4 the  lateral aspect of the pars interarticularis of L3.  Intertransverse  ligament was elevated resected piecemeal fashion using Kerrison  rongeurs.  Underlying left sided L3 nerve root was identified.  Microscope brought to field, used for microdissection of the left sided  L3 nerve root underlying disk herniation.  Epidural venous plexus was  coagulated and cut.  The L3 nerve root was gently mobilized cephalad.  Disk space was found to be grossly herniated.  This then incised 15  blade in rectangular fracture.  Wide disk space cleanout was achieved  using pituitary rongeurs, upward angle pituitary rongeurs and Epstein  curettes.  All elements of the disk herniation completely resected.  All  loose or obviously degenerative disk material removed from the  interspace.  After very thorough decompression had been achieved, wound  was then irrigated antibiotic solution.  Gelfoam was placed topically  for hemostasis, found to be good.  Microscope, retractor system were  removed.  Hemostasis  muscles achieved with electrocautery.  Wound was closed in layers with  Vicryl  sutures.  Steri-Strips sterile dressing were applied.  There were  no apparent complications.  The patient tolerated the procedure well and  he returns recovery room postoperatively.           ______________________________  Kathaleen Maser Pool, M.D.     HAP/MEDQ  D:  09/19/2006  T:  09/20/2006  Job:  [id number]

## 2011-04-25 ENCOUNTER — Other Ambulatory Visit: Payer: Self-pay | Admitting: Pulmonary Disease

## 2011-04-27 ENCOUNTER — Ambulatory Visit (INDEPENDENT_AMBULATORY_CARE_PROVIDER_SITE_OTHER): Payer: Medicare Other | Admitting: Adult Health

## 2011-04-27 ENCOUNTER — Encounter: Payer: Self-pay | Admitting: Adult Health

## 2011-04-27 DIAGNOSIS — B356 Tinea cruris: Secondary | ICD-10-CM | POA: Insufficient documentation

## 2011-04-27 NOTE — Progress Notes (Signed)
  Subjective:    Patient ID: JRUE JARRIEL, male    DOB: [birth year], 70 y.o.   MRN: [id number]  HPI 04/27/2011 Acute Ov  Presents for  Work in visit. Complains of small red bumps, itching on inside of thighs and toward the left groin onset this morning - reports cleaning algae at the beach last week . Some itching . NO pain or urinary issues.  Had shingles outbreak on left side of face was given Acyclovir by urgent care . Resolved lesions. No residing pain. Seen by opthalomology with no eye involvement.   Review of Systems Constitutional:   No  weight loss, night sweats,  Fevers, chills, fatigue, or  lassitude.  HEENT:   No headaches,  Difficulty swallowing,  Tooth/dental problems, or  Sore throat,                No sneezing, itching, ear ache, nasal congestion, post nasal drip,   CV:  No chest pain,  Orthopnea, PND, swelling in lower extremities, anasarca, dizziness, palpitations, syncope.   GI  No heartburn, indigestion, abdominal pain, nausea, vomiting, diarrhea, change in bowel habits, loss of appetite, bloody stools.   Resp: No shortness of breath with exertion or at rest.  No excess mucus, no productive cough,  No non-productive cough,  No coughing up of blood.  No change in color of mucus.  No wheezing.  No chest wall deformity  Skin: + rash along inner groin   GU: no dysuria, change in color of urine, no urgency or frequency.  No flank pain, no hematuria   MS:  No joint pain or swelling.  No decreased range of motion.  No back pain.  Psych:  No change in mood or affect. No depression or anxiety.  No memory loss.     '     Objective:   Physical Exam GEN: A/Ox3; pleasant , NAD, well nourished   HEENT:  [place]/AT,  EACs-clear, TMs-wnl, NOSE-clear, THROAT-clear, no lesions, no postnasal drip or exudate noted.   NECK:  Supple w/ fair ROM; no JVD; normal carotid impulses w/o bruits; no thyromegaly or nodules palpated; no lymphadenopathy.  RESP  Clear  P & A; w/o, wheezes/  rales/ or rhonchi.no accessory muscle use, no dullness to percussion  CARD:  RRR, no m/r/g  , no peripheral edema, pulses intact, no cyanosis or clubbing.  GI:   Soft & nt; nml bowel sounds; no organomegaly or masses detected.  Musco: Warm bil, no deformities or joint swelling noted.   Neuro: alert, no focal deficits noted.    Skin: Warm, excoriated plaque along inner groin area , no blisters noted.  Few papules along inner thighs          Assessment & Plan:

## 2011-04-27 NOTE — Patient Instructions (Signed)
Lotrimin cream apply Twice daily  Until resolved.  Cotton underwear.  Avoid bathing suits for prolonged times.  Dry well after shower and swimming.  Please contact office for sooner follow up if symptoms do not improve or worsen or seek emergency care    

## 2011-04-27 NOTE — Assessment & Plan Note (Signed)
Lotrimin cream apply Twice daily  Until resolved.  Cotton underwear.  Avoid bathing suits for prolonged times.  Dry well after shower and swimming.  Please contact office for sooner follow up if symptoms do not improve or worsen or seek emergency care

## 2011-06-09 ENCOUNTER — Ambulatory Visit (INDEPENDENT_AMBULATORY_CARE_PROVIDER_SITE_OTHER): Payer: Medicare Other | Admitting: Pulmonary Disease

## 2011-06-09 ENCOUNTER — Other Ambulatory Visit (INDEPENDENT_AMBULATORY_CARE_PROVIDER_SITE_OTHER): Payer: Medicare Other

## 2011-06-09 ENCOUNTER — Encounter: Payer: Self-pay | Admitting: Pulmonary Disease

## 2011-06-09 DIAGNOSIS — Z87442 Personal history of urinary calculi: Secondary | ICD-10-CM

## 2011-06-09 DIAGNOSIS — J449 Chronic obstructive pulmonary disease, unspecified: Secondary | ICD-10-CM

## 2011-06-09 DIAGNOSIS — F17200 Nicotine dependence, unspecified, uncomplicated: Secondary | ICD-10-CM

## 2011-06-09 DIAGNOSIS — B9789 Other viral agents as the cause of diseases classified elsewhere: Secondary | ICD-10-CM

## 2011-06-09 DIAGNOSIS — I10 Essential (primary) hypertension: Secondary | ICD-10-CM

## 2011-06-09 DIAGNOSIS — K2990 Gastroduodenitis, unspecified, without bleeding: Secondary | ICD-10-CM

## 2011-06-09 DIAGNOSIS — F419 Anxiety disorder, unspecified: Secondary | ICD-10-CM | POA: Insufficient documentation

## 2011-06-09 DIAGNOSIS — K5730 Diverticulosis of large intestine without perforation or abscess without bleeding: Secondary | ICD-10-CM

## 2011-06-09 DIAGNOSIS — Z860100 Personal history of colon polyps, unspecified: Secondary | ICD-10-CM

## 2011-06-09 DIAGNOSIS — B349 Viral infection, unspecified: Secondary | ICD-10-CM

## 2011-06-09 DIAGNOSIS — E785 Hyperlipidemia, unspecified: Secondary | ICD-10-CM

## 2011-06-09 DIAGNOSIS — E119 Type 2 diabetes mellitus without complications: Secondary | ICD-10-CM

## 2011-06-09 DIAGNOSIS — J4489 Other specified chronic obstructive pulmonary disease: Secondary | ICD-10-CM

## 2011-06-09 DIAGNOSIS — M159 Polyosteoarthritis, unspecified: Secondary | ICD-10-CM

## 2011-06-09 DIAGNOSIS — K2970 Gastritis, unspecified, without bleeding: Secondary | ICD-10-CM

## 2011-06-09 DIAGNOSIS — R197 Diarrhea, unspecified: Secondary | ICD-10-CM

## 2011-06-09 LAB — BASIC METABOLIC PANEL
BUN: 15 mg/dL (ref 6–23)
CO2: 29 mEq/L (ref 19–32)
Glucose, Bld: 131 mg/dL — ABNORMAL HIGH (ref 70–99)
Potassium: 4.4 mEq/L (ref 3.5–5.1)
Sodium: 140 mEq/L (ref 135–145)

## 2011-06-09 LAB — LIPID PANEL
Cholesterol: 145 mg/dL (ref 0–200)
HDL: 48.9 mg/dL (ref >39.00)
VLDL: 19.8 mg/dL (ref 0.0–40.0)

## 2011-06-09 LAB — HEMOGLOBIN A1C: Hgb A1c MFr Bld: 6.7 % — ABNORMAL HIGH (ref 4.6–6.5)

## 2011-06-09 NOTE — Progress Notes (Signed)
Subjective:    Patient ID: JESSI JESSOP, male    DOB: 1940-11-17, 70 y.o.   MRN: [id number]  HPI 70 y/o WM here for a follow up visit... he has multiple medical problems including COPD & he still smokes a few cigs daily;  HBP;  Hyperlipidemia;  DM;  HH w/ decr LES;  Divertics & colon polyps;  hx kidney stones/ BPH w/ TURP followed by DrEvans & DrKimbrough;  DJD w/ neck & back pain- s/p lumbar diskectomy by DrPool...  ~  December 08, 2009:  he has been feeling well- no new complaints or concerns... he is still taking the HCTZ but stopped his KCl- I refilled both & asked him to be sure he takes the K Bid if taking the diuretic... he never did f/u w/ DrEvans & prefers referral to Urology in [place]- I offered to wean his HCTZ/ KCl meds but her prefers to talk w/ Urology 1st & we will set this up... he is still smoking a few- mostly while golfing, none at home, est~ 1ppwk... f/u CXR today is clear, he knows he needs to quit entirely...  he had f/u colonoscopy 7/10 by DrGessner w/ several adenomatous polyps removed- f/u planned 48yrs...  ~  August 17, 2010:  started w/ GI symptoms in Sept w/ ?gastroent (n/v/d), treated w/ Cipro & improved but weak... persist abd cramping, loose stool after eating, gas/ rumbling, & weight loss documented at 17# down to 163#... he takes organic fiber tabs, Levsin helps some, Metformin was stopped & BS betw 80-160 since then... no epig pain or reflux symtoms, he travels to the beach freq, swims in ocean & sound, wonders about Giardia after checking his symptoms on the internet... we decided to check stool studies (neg), CT Abd (NAD- bilat nonobstructive kid stones), Rx w/ Flagyl, & request further GI eval from DrGessner> EGD w/ mod erosive gastritis, esophagitis, duodenitis (HPylori neg)- rx OMEP40mg /d.  ~  December 11, 2010:  He notes that his "stomach problems" are "improved but not fixed" but he is off meds & encouraged to restart Omep40mg /d & f/u w/ DrGessner...  he also  notes sl cough, yellow sputum but denies f/c/s, incr SOB/ CP/ etc (we discussed checking CXR- clear, NAD) & rx w/ ZPak)... he had ?migraine HA yest that resolved spont... finally relates prob w/ left shoulder x 6-8wks & rather than rx w/ NSAID which he should avoid, we will refer to DrSypher for eval...    Still smoking 1-3 cig/d & encouraged to quit completely;  BP controlled on diet/ exercise/ & HCTZ perscribed for his kidney stones;  FLP looks good on Simva20 + Lopid600;  DM= fair control on diet alone (he stopped his Metformin on his own) w/ A1c up to 7.3 off the med;  KidStones followed by drKimbrough who agreed w/ weaning the diuretic/ K+ therapy(on HCTZ50- 1/2 daily + one UrorcitK/d...  ~  June 09, 2011:  25mo ROV & he has mult complaints> notes persistent vague GI symptoms like several soft BMs every AM, intermittent crampy pain, sounds like IBS- saw DrGessner last yr (stool studies neg), notes reviewed, in view of on-going symptoms rec f/u w/ him...  Had CTAbd 11/11> NAD; EGD showed gastitis, duodenitis, neg HPylori & treated w/ PPI (Prilosec) but no better he says so he stopped it...    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  Rx written for shingles vaccine to fill at pharmacy clinic...   Current Problems:   S/P ENT SURG by DrShoemaker 2000 for SNORING- ?UPPP w/ resolution of snoring problem...  COPD (ICD-496) - smoker w/ hx of intermittent bronchitic problems, no chronic dyspnea, cough, sputum, etc; not on regular meds and hasn't required antibiotics in quite some time... ~  PFT's 2000 showed FVC= 3.33 (74%), FEV1= 1.76 (45%), %1sec= 52,  mid-flows= 34% pred. ~  baseline CXR w/ sl hyperinflation, clear, NAD.Marland Kitchen. ~  f/u CXR 2/11 shows no acute changes... ~  CXR 2/12 showed clear lungs, NAD...  CIGARETTE SMOKER (ICD-305.1) - still smokes on occas but not regularly per pt- eg. on the golf course... he was able to decr smoking w/ Chantix Rx... he knows that he needs to discontinue all smoking.  HYPERTENSION (ICD-401.9) - diet controlled, but he was placed on HCTZ (50mg - now taking 1/2 tab daily) by DrEvans for kidney stone prevention in about 2003, and he wants to stop it... asked to monitor BP at home w/ digital cuff...  BP today = 118/80> denies HA, fatigue, visual changes, CP, palipit, dizziness, syncope, dyspnea, edema, etc...   HYPERLIPIDEMIA (ICD-272.4) - on SIMVASTATIN 20mg /d & LOPID 600mg /d... his TG level was [phone] range in 1995-6 w/ Gemfibrizol started then... ~  FLP 1/07 on Lopid showed TChol 207, TG 77, HDL 41, LDL 147... rec> Simva20 added. ~  FLP 11/07 showed TChol 141, TG 82, HDL 37, LDL 87 ~  FLP 7/08 showed TChol 157, TG 99, HDL 44, LDL 94... Stable on Simva20 + Lopid600. ~  FLP 5/10 showed TChol 133, TG 97, HDL 43, LDL 70 ~  FLP 2/11 showed TChol 139, TG 105, HDL 47, LDL 71... Stable on simva20 + Lopid600. ~  FLP 8/12 showed TChol 145, TG 99, HDL 49, LDL 76  DIABETES MELLITUS (ICD-250.00) - + FamHx w/ DM in father... pt diet controlled in past- but in 2007 he took Medrol Rx from [employer], Neurosurg for LBP and developed problems while on a mission trip to [place]... Metformin started then and currently taking METFORMIN 500mg  Qam... ~  labs 11/07 showed BS= 98, A1c= 7.1 ~  labs 7/08 showed BS= 135, A1c= 6.7.Marland KitchenMarland Kitchen Continue Metformin Rx. ~  labs 12/08 showed BS= 111, A1c= 6.9 ~  labs 5/10 showed BS= 107, A1c= 6.5.Marland KitchenMarland Kitchen Continue Metformin + diet. 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 ~  EGD 11/11 by  DrGessner showed esophagitis, gastritis, duodenitis> neg HPylori, treated w/ Omeprazole 40mg /d...  DIVERTICULOSIS OF COLON (ICD-562.10) >> R/O IBS... COLONIC POLYPS (ICD-211.3)  HEMORRHOIDS (ICD-455.6) - there is a pos family hx of colon cancer in his mother... ~  colonoscopy 7/04 showed divertics, hems, no polyps... f/u planned 5 yrs. ~  f/u colon 7/10 DrGessner w/ 6 polyps removed- max 8mm- mult tubular adenomas removed & f/u planned 43yrs. ~  CTAbd 11/11 showed mild bibasilar atx, right hepatic cyst, NAD...  RENAL CALCULUS, HX OF (ICD-V13.01) - on HCTZ 50mg /d and Urocit-K Bid per DrEvans... he wants to stop these meds but prefers to have Urology appt 1st to discuss  this & we will set this up for him. ~  s/p right ureteroscopy 2002 by DrEvans for stone...  BENIGN PROSTATIC HYPERTROPHY, HX OF (ICD-V13.8) - s/p TURP & removal of bladder stone in 2003 by DrEvans... 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 Hx of NECK PAIN (ICD-723.1) - s/p CSpine surg x2 in 1986 & 1995... Hx of BACK PAIN, LUMBAR (ICD-724.2) - s/p Lumbar microdiscectomy 12/07 by DrPool...  Health Maintenance: ~  GI= DrGessner ~  GU= prev DrEvans & we will refer to [facility] per his request... DRE neg, PSA pending. ~  Immunizations:  gets yearly Flu vaccine... Tetanus? probably before his [place] trip in 2006... PNEUMOVAX given 2/11.   Past Surgical History  Procedure Date  . Cervical spine surgery 1986, 1995  . Uvulopalatopharyngoplasty 2001    Dr Annalee Genta  . Cystoscopy 11/03    TURP and stone removal - Dr Logan Bores  . Dental implants 2007  . Lumbar microdiscectomy 12/07    L3-4 by Dr Jordan Likes    Outpatient Encounter Prescriptions as of 06/09/2011  Medication Sig Dispense Refill  . aspirin 81 MG tablet Take 81 mg by mouth daily.        Marland Kitchen gemfibrozil (LOPID) 600 MG tablet Take 300 mg by mouth daily.       . hydrochlorothiazide 50 MG  tablet Take 25 mg by mouth daily.        . metFORMIN (GLUCOPHAGE) 500 MG tablet Take 500 mg by mouth daily with breakfast.       . potassium citrate (UROCIT-K) 10 MEQ (1080 MG) SR tablet Take 10 mEq by mouth daily.        . sildenafil (VIAGRA) 100 MG tablet Take 100 mg by mouth daily as needed.        . simvastatin (ZOCOR) 20 MG tablet Take 20 mg by mouth at bedtime.        Marland Kitchen DISCONTD: omeprazole (PRILOSEC) 20 MG capsule Take 20 mg by mouth daily.          No Known Allergies   Current Medications, Allergies, Past Medical History, Past Surgical History, Family History, and Social History were reviewed in [employer] record.    Review of Systems         See HPI - all other systems neg except as noted...       The patient complains of abdominal discomfort.  The patient denies anorexia, fever, weight gain, vision loss, decreased hearing, hoarseness, chest pain, syncope, dyspnea on exertion, peripheral edema, prolonged cough, headaches, hemoptysis, melena, hematochezia, severe indigestion/heartburn, hematuria, incontinence, muscle weakness, suspicious skin lesions, transient blindness, difficulty walking, depression, unusual weight change, abnormal bleeding, enlarged lymph nodes, and angioedema.     Objective:   Physical Exam     WD, WN, 70 y/o WM in NAD... GENERAL:  Alert & oriented; pleasant & cooperative... HEENT:  [place]/AT, EOM-wnl, PERRLA, EACs-clear, TMs-wnl, NOSE-clear, THROAT-clear & wnl. NECK:  Supple w/ decrROM; no JVD; normal carotid impulses w/o bruits; no thyromegaly or nodules palpated; no lymphadenopathy. CHEST:  Clear to P & A; without wheezes/ rales/ or rhonchi heard... HEART:  Regular Rhythm; without murmurs/ rubs/ or gallops detected... ABDOMEN:  Soft & nontender; normal bowel sounds; no organomegaly or masses palpated... EXT: without deformities, mild arthritic changes; no varicose veins/ venous insuffic/ or edema. NEURO: CNs intact, no focal neuro  deficits... DERM:  No lesions noted; no rash etc..Marland Kitchen  Assessment & Plan:   ?HSV1, recurrent>  We will check HSV viral titers and plan Rx w/ Acyclovir 400mg - 5x per day for outbreak & decr to Bid for suppression...  GI> persistent GI symptoms> Hx of HH, GERD, Divertics, IBS, etc; I rec restarting the PPI rx and f/u w/ DrGessner  COPD, ?ex-smoker>  Reminded to quit completely; denies cough, phlegm, SOB, CP, etc; he is not inclined to take meds or do f/u breathing tests, etc...  HBP>  Controlled on diet + the HCT 25mg /d...  HYPERLIPID>  Labs look good on Simva20 + Lopid600, continue same...  DM>  Labs stable w/ A1c= 6.7 on the Metform500/d + diet etc...  Kidney Stones>  Known nephrolithiasis in kidneys, no colick attacks or ureteral stones recently...  DJD w/ neck & back pain intermittently> he uses OTC meds as needed.Marland KitchenMarland Kitchen

## 2011-06-09 NOTE — Patient Instructions (Signed)
Today we updated your med list in EPIC...    Continue your current meds the same for now...  Today we did your follow up fasting blood work...    Please call the [facility] in a few days for your results...    Dial [id number] & when prompted enter your patient number followed by the # symbol...    Your patient number is:  [id number]#  We also checked your Viral titers to see if your need anti-viral therapy for the recurrent fever blisters...  We will arrange for a follow up GI appt w/ DrGessner to discuss your on-going GI symptoms...  Call for any questions...  Let's plan a follow up visit in 6 months, sooner if needed for problems.Marland KitchenMarland Kitchen

## 2011-06-10 ENCOUNTER — Encounter: Payer: Self-pay | Admitting: Pulmonary Disease

## 2011-06-10 LAB — HSV 2 ANTIBODY, IGG: HSV 2 Glycoprotein G Ab, IgG: 0.12 IV

## 2011-06-10 LAB — HSV 1 ANTIBODY, IGG: HSV 1 Glycoprotein G Ab, IgG: 9 IV — ABNORMAL HIGH

## 2011-06-15 ENCOUNTER — Other Ambulatory Visit: Payer: Self-pay | Admitting: *Deleted

## 2011-06-15 MED ORDER — ACYCLOVIR 400 MG PO TABS: 400.0000 mg | ORAL_TABLET | Freq: Two times a day (BID) | ORAL | Status: DC

## 2011-06-22 ENCOUNTER — Other Ambulatory Visit: Payer: Self-pay | Admitting: Pulmonary Disease

## 2011-06-24 ENCOUNTER — Telehealth: Payer: Self-pay | Admitting: Pulmonary Disease

## 2011-06-24 MED ORDER — POTASSIUM CITRATE ER 10 MEQ (1080 MG) PO TBCR: 10.0000 meq | EXTENDED_RELEASE_TABLET | Freq: Every day | ORAL | Status: DC

## 2011-06-24 NOTE — Telephone Encounter (Signed)
Called and spoke with pt.  Pt requested 90 day supply of Potassium Citrate be sent to pharmacy.  Informed him rx sent.  Nothing further needed.

## 2011-07-19 ENCOUNTER — Ambulatory Visit (INDEPENDENT_AMBULATORY_CARE_PROVIDER_SITE_OTHER): Payer: Medicare Other | Admitting: Internal Medicine

## 2011-07-19 ENCOUNTER — Encounter: Payer: Self-pay | Admitting: Internal Medicine

## 2011-07-19 DIAGNOSIS — F17200 Nicotine dependence, unspecified, uncomplicated: Secondary | ICD-10-CM

## 2011-07-19 DIAGNOSIS — K589 Irritable bowel syndrome without diarrhea: Secondary | ICD-10-CM

## 2011-07-19 DIAGNOSIS — K219 Gastro-esophageal reflux disease without esophagitis: Secondary | ICD-10-CM

## 2011-07-19 MED ORDER — ALIGN PO CAPS: 1.0000 | ORAL_CAPSULE | Freq: Every day | ORAL | Status: DC

## 2011-07-19 NOTE — Progress Notes (Signed)
  Subjective:    Patient ID: [name], male    DOB: 18-Jun-1941, 71 y.o.   MRN: [id number]  HPI Last seen 08/2010. Having multiple bowel movements at times like he was after the gastroinestinal illness that occurred in 06/2010. Stools are relatively loose but formed usually but may have 3-4 in a morning. Sometimes watery. No incontinence. No sleep disturbance. Denies sugar-free candy. Metformin has been on med list prior to this starting. He stopped the omeprazole and could not last so restarted 2 months ago. 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   Review of Systems As above    Objective:   Physical Exam WDWN NAD       Assessment & Plan:

## 2011-07-19 NOTE — Assessment & Plan Note (Addendum)
Stay on a PPI for now - suspected inflammatory changes in distal esophagus on EGD 2011. It controls symptoms. Consider withdrawal later.

## 2011-07-19 NOTE — Assessment & Plan Note (Signed)
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

## 2011-07-19 NOTE — Patient Instructions (Addendum)
You have been given a separate informational sheet regarding your tobacco use, the importance of quitting and local resources to help you quit. Try Align 1 capsule a day to help the IBS or Irritable Bowel Syndrome. If that helps it is ok to continue it - or take it a month at a time intermittently, when symptoms return. If this is unsuccessful, try a tablespoon of Benefiber daily or you may add that to the regimen if Align helps partially.

## 2011-07-20 NOTE — Assessment & Plan Note (Signed)
Smoking cessation discussed today.

## 2011-08-24 ENCOUNTER — Other Ambulatory Visit: Payer: Self-pay | Admitting: Pulmonary Disease

## 2011-08-26 ENCOUNTER — Encounter (HOSPITAL_BASED_OUTPATIENT_CLINIC_OR_DEPARTMENT_OTHER): Payer: Self-pay | Admitting: *Deleted

## 2011-08-26 ENCOUNTER — Ambulatory Visit (HOSPITAL_BASED_OUTPATIENT_CLINIC_OR_DEPARTMENT_OTHER)
Admission: RE | Admit: 2011-08-26 | Discharge: 2011-08-26 | Disposition: A | Discharge status: Home / Self Care | Payer: Medicare Other | Source: Ambulatory Visit | Attending: Orthopedic Surgery | Admitting: Orthopedic Surgery

## 2011-08-26 ENCOUNTER — Encounter (HOSPITAL_BASED_OUTPATIENT_CLINIC_OR_DEPARTMENT_OTHER)
Admission: RE | Admit: 2011-08-26 | Discharge: 2011-08-26 | Disposition: A | Discharge status: Home / Self Care | Payer: Medicare Other | Source: Ambulatory Visit | Attending: Orthopedic Surgery | Admitting: Orthopedic Surgery

## 2011-08-26 DIAGNOSIS — M19019 Primary osteoarthritis, unspecified shoulder: Secondary | ICD-10-CM | POA: Insufficient documentation

## 2011-08-26 DIAGNOSIS — M67919 Unspecified disorder of synovium and tendon, unspecified shoulder: Secondary | ICD-10-CM | POA: Insufficient documentation

## 2011-08-26 DIAGNOSIS — M719 Bursopathy, unspecified: Secondary | ICD-10-CM | POA: Insufficient documentation

## 2011-08-26 DIAGNOSIS — M25819 Other specified joint disorders, unspecified shoulder: Secondary | ICD-10-CM | POA: Insufficient documentation

## 2011-08-26 LAB — BASIC METABOLIC PANEL
BUN: 21 mg/dL (ref 6–23)
CO2: 28 mEq/L (ref 19–32)
GFR calc non Af Amer: 71 mL/min — ABNORMAL LOW (ref >90)
Glucose, Bld: 156 mg/dL — ABNORMAL HIGH (ref 70–99)
Potassium: 4.1 mEq/L (ref 3.5–5.1)

## 2011-08-26 NOTE — Progress Notes (Signed)
To bring overnight bag-all meds ekg and cxr done dr Kirstie Mirza 2/12

## 2011-08-27 ENCOUNTER — Other Ambulatory Visit: Payer: Self-pay | Admitting: Orthopedic Surgery

## 2011-08-27 NOTE — Consults (Signed)
Dr Gypsy Balsam cleared abnormal lab work . Ok for surg

## 2011-08-30 NOTE — H&P (Signed)
December 30, 2010   Alroy Dust, MD Fax [phone]  RE: Sehaj Mcenroe. Kindel DOB: 08/08/1941 MEDICARE   Dear Lorin Picket:  Thank you for referring Nichols Corter for a consult regarding his painful left shoulder. Keshun is a 70 year old retired [employer]. During the past 3 weeks he has had increasing left shoulder pain with minor loss of ROM. He has difficulty sleeping with his arm in abduction. He has been using ibuprofen as an analgesic medication. He has "positional night pain" but does not have pain with activities. He enjoys playing golf regularly and does not have pain while playing golf.   Mr. Hobbins has had cervical degenerative disc disease and has had cervical spine surgery times 2. He has had a history of lumbosacral degenerative disc disease treated by Dr. Jordan Likes with surgery 4 years prior. He enjoys playing golf 2 times a week with a 3 handicap.   His past medical history is reviewed in detail. He reports that he is 5'11" and 170 lbs. He is not using any analgesic medication for his discomfort at this time. He has no drug allergies. Current medications include Gemfibrozil 600 mg daily, HCTZ 25 mg daily, potassium supplements, Metformin 250 mg daily, Simvastatin 20 mg daily and Omeprazole 40 mg daily. He has had prior cervical and lumbar spine surgery. His social history reveals he is married. He is a one-quarter pack a day smoker. He enjoys a rare alcoholic beverage. His family history reveals diabetes affecting his father. A 14 system review of systems reveals corrective lenses, a history of renal calculi 2000.   Physical exam reveals a very fit 70 year old gentleman who looks younger than his stated age. His shoulder ROM is nearly full with mild adhesive capsulitis, minimal loss of external rotation of his left shoulder 85 degrees left at 90 degrees abduction versus 100 degrees right. He has internal rotation on the right to T8, left T9. He has symmetrical elevation.  He has mild impairment of scaption. His biceps is nontender. His strength is well preserved except for minor discomfort with resisted scaption and forward flexion against resistance.   Plain x-rays of his shoulder demonstrates degenerative change at the [facility] joint consistent with his chronological age. Alroy Dust, MD Page 2 December 30, 2010  RE: Arling Cerone. Strawser DOB: 03/19/1941  He has a cyst in the humeral head. His glenohumeral joint space is well preserved.   Assessment: Minor adhesive capsulitis left shoulder rule out rotator cuff tendinopathy.  Plan: He is referred to see Calla Kicks for a course of supervised physical therapy. If he does not improve, we will consider imaging his shoulder with an MRI. He will continue to use OTC analgesics.  Thank you for allowing me to participate in the care of your patients.   Best regards,    Katy Fitch. Naaman Plummer., MD RVS/phe T: 12-31-10           MEDICARE   PATIENT:  Al Decant. Menon   SEEN BY:    Katy Fitch. Naaman Plummer MD   OFFICE VISIT:   01-27-11 DOB: 06/24/1941  Latoya Diskin returns to review the MRI of his left shoulder. He continues to have pain with overhead lifting or lifting suitcases. All the other activities he performs in his daily routine including playing golf are pain free.   Jamel has a small full thickness rotator cuff tear of his anterior supraspinatus. His area of complete tear is somewhere between the size of  a dime and a nickle. Loye can continue to exercise and work around this for the foreseeable future.   When this causes an impairment of lifestyle including sleep impairment, pain that interferes with the activities of daily living and pain that impairs his golf game, he should return and have it repaired. We have discussed the strategy of having this repaired in detail. I will see him back for follow up on a PRN basis.  _________________________ Katy Fitch Naaman Plummer., MD/pe T: 02-01-11     OFFICE VISIT     06-23-11      Seen by: Katy Fitch. Dillon Mcreynolds, Montez Hageman., MD   Mr. Madlock returns for follow up evaluation of his left shoulder rotator cuff tear. He has completed therapy and is much stronger. He still has night pain and can still not comfortably put on sleeves at times. There are certain activities such as lifting a suitcase which is quite painful. He is ready to proceed with surgery in November. He would like to resume playing golf in the late spring.  We reviewed the surgery, after care, potential risks and benefits in detail. We discussed the entire aftercare plan, timing of exercises, physical therapy, do's and don'ts. Questions were invited and answered in detail. We will schedule this for 08-26-11. We reviewed his medications.    _________________________ Katy Fitch Naaman Plummer., MD/pe T: 06-28-11

## 2011-08-31 ENCOUNTER — Encounter (HOSPITAL_BASED_OUTPATIENT_CLINIC_OR_DEPARTMENT_OTHER): Payer: Self-pay | Admitting: Anesthesiology

## 2011-08-31 ENCOUNTER — Encounter (HOSPITAL_BASED_OUTPATIENT_CLINIC_OR_DEPARTMENT_OTHER)
Admission: RE | Disposition: A | Discharge status: Home / Self Care | Payer: Self-pay | Source: Ambulatory Visit | Attending: Orthopedic Surgery

## 2011-08-31 ENCOUNTER — Encounter (HOSPITAL_BASED_OUTPATIENT_CLINIC_OR_DEPARTMENT_OTHER): Payer: Self-pay | Admitting: *Deleted

## 2011-08-31 ENCOUNTER — Ambulatory Visit (HOSPITAL_BASED_OUTPATIENT_CLINIC_OR_DEPARTMENT_OTHER)
Admission: RE | Admit: 2011-08-31 | Discharge: 2011-09-01 | Disposition: A | Discharge status: Home / Self Care | Payer: Medicare Other | Source: Ambulatory Visit | Attending: Orthopedic Surgery | Admitting: Orthopedic Surgery

## 2011-08-31 ENCOUNTER — Ambulatory Visit (HOSPITAL_BASED_OUTPATIENT_CLINIC_OR_DEPARTMENT_OTHER): Payer: Medicare Other | Admitting: Anesthesiology

## 2011-08-31 DIAGNOSIS — E119 Type 2 diabetes mellitus without complications: Secondary | ICD-10-CM | POA: Insufficient documentation

## 2011-08-31 DIAGNOSIS — M25819 Other specified joint disorders, unspecified shoulder: Secondary | ICD-10-CM | POA: Insufficient documentation

## 2011-08-31 DIAGNOSIS — M249 Joint derangement, unspecified: Secondary | ICD-10-CM | POA: Insufficient documentation

## 2011-08-31 DIAGNOSIS — Z01812 Encounter for preprocedural laboratory examination: Secondary | ICD-10-CM | POA: Insufficient documentation

## 2011-08-31 DIAGNOSIS — M719 Bursopathy, unspecified: Secondary | ICD-10-CM | POA: Insufficient documentation

## 2011-08-31 DIAGNOSIS — J4489 Other specified chronic obstructive pulmonary disease: Secondary | ICD-10-CM | POA: Insufficient documentation

## 2011-08-31 DIAGNOSIS — F17200 Nicotine dependence, unspecified, uncomplicated: Secondary | ICD-10-CM | POA: Insufficient documentation

## 2011-08-31 DIAGNOSIS — J449 Chronic obstructive pulmonary disease, unspecified: Secondary | ICD-10-CM | POA: Insufficient documentation

## 2011-08-31 DIAGNOSIS — M67919 Unspecified disorder of synovium and tendon, unspecified shoulder: Secondary | ICD-10-CM | POA: Insufficient documentation

## 2011-08-31 DIAGNOSIS — M19019 Primary osteoarthritis, unspecified shoulder: Secondary | ICD-10-CM | POA: Insufficient documentation

## 2011-08-31 DIAGNOSIS — I10 Essential (primary) hypertension: Secondary | ICD-10-CM | POA: Insufficient documentation

## 2011-08-31 HISTORY — DX: Chronic kidney disease, unspecified: N18.9

## 2011-08-31 HISTORY — DX: Shortness of breath: R06.02

## 2011-08-31 SURGERY — ARTHROSCOPY, SHOULDER, WITH ROTATOR CUFF REPAIR
Anesthesia: General | Site: Shoulder | Laterality: Left | Wound class: Clean

## 2011-08-31 MED ORDER — MENTHOL 3 MG MT LOZG: 1.0000 | LOZENGE | OROMUCOSAL | Status: DC | PRN

## 2011-08-31 MED ORDER — POLYETHYLENE GLYCOL 3350 17 G PO PACK: 17.0000 g | PACK | Freq: Every day | ORAL | Status: DC | PRN

## 2011-08-31 MED ORDER — ACETAMINOPHEN 650 MG RE SUPP: 650.0000 mg | Freq: Four times a day (QID) | RECTAL | Status: DC | PRN

## 2011-08-31 MED ORDER — PROPOFOL 10 MG/ML IV EMUL
INTRAVENOUS | Status: DC | PRN
  Administered 2011-08-31: 200 mg via INTRAVENOUS

## 2011-08-31 MED ORDER — MIDAZOLAM HCL 2 MG/2ML IJ SOLN
1.0000 mg | INTRAMUSCULAR | Status: DC | PRN
  Administered 2011-08-31: 2 mg via INTRAVENOUS

## 2011-08-31 MED ORDER — BISACODYL 10 MG RE SUPP: 10.0000 mg | Freq: Every day | RECTAL | Status: DC | PRN

## 2011-08-31 MED ORDER — CEFAZOLIN SODIUM 1-5 GM-% IV SOLN
1.0000 g | Freq: Once | INTRAVENOUS | Status: AC
  Administered 2011-08-31: 1 g via INTRAVENOUS

## 2011-08-31 MED ORDER — HYDROMORPHONE HCL 2 MG PO TABS
2.0000 mg | ORAL_TABLET | ORAL | Status: DC | PRN
  Administered 2011-08-31 (×2): 2 mg via ORAL

## 2011-08-31 MED ORDER — HYDROMORPHONE HCL 2 MG PO TABS: ORAL_TABLET | ORAL | Status: DC

## 2011-08-31 MED ORDER — LIDOCAINE HCL (CARDIAC) 20 MG/ML IV SOLN
INTRAVENOUS | Status: DC | PRN
  Administered 2011-08-31: 60 mg via INTRAVENOUS

## 2011-08-31 MED ORDER — SUCCINYLCHOLINE CHLORIDE 20 MG/ML IJ SOLN
INTRAMUSCULAR | Status: DC | PRN
  Administered 2011-08-31: 100 mg via INTRAVENOUS

## 2011-08-31 MED ORDER — ONDANSETRON HCL 4 MG/2ML IJ SOLN: 4.0000 mg | Freq: Four times a day (QID) | INTRAMUSCULAR | Status: DC | PRN

## 2011-08-31 MED ORDER — EPHEDRINE SULFATE 50 MG/ML IJ SOLN
INTRAMUSCULAR | Status: DC | PRN
  Administered 2011-08-31: 10 mg via INTRAVENOUS

## 2011-08-31 MED ORDER — MAGNESIUM HYDROXIDE 400 MG/5ML PO SUSP: 30.0000 mL | Freq: Two times a day (BID) | ORAL | Status: DC | PRN

## 2011-08-31 MED ORDER — DEXAMETHASONE SODIUM PHOSPHATE 4 MG/ML IJ SOLN
INTRAMUSCULAR | Status: DC | PRN
  Administered 2011-08-31: 10 mg via INTRAVENOUS

## 2011-08-31 MED ORDER — SODIUM CHLORIDE 0.9 % IR SOLN
Status: DC | PRN
  Administered 2011-08-31: 3000 mL

## 2011-08-31 MED ORDER — FENTANYL CITRATE 0.05 MG/ML IJ SOLN
50.0000 ug | INTRAMUSCULAR | Status: DC | PRN
  Administered 2011-08-31: 100 ug via INTRAVENOUS

## 2011-08-31 MED ORDER — CEFAZOLIN SODIUM 1-5 GM-% IV SOLN
1.0000 g | Freq: Four times a day (QID) | INTRAVENOUS | Status: AC
  Administered 2011-08-31 – 2011-09-01 (×3): 1 g via INTRAVENOUS

## 2011-08-31 MED ORDER — FLEET ENEMA 7-19 GM/118ML RE ENEM: 1.0000 | ENEMA | Freq: Every day | RECTAL | Status: DC | PRN

## 2011-08-31 MED ORDER — PROMETHAZINE HCL 25 MG/ML IJ SOLN: 6.2500 mg | INTRAMUSCULAR | Status: DC | PRN

## 2011-08-31 MED ORDER — PHENOL 1.4 % MT LIQD: 1.0000 | OROMUCOSAL | Status: DC | PRN

## 2011-08-31 MED ORDER — DOCUSATE SODIUM 100 MG PO CAPS
100.0000 mg | ORAL_CAPSULE | Freq: Two times a day (BID) | ORAL | Status: DC
  Administered 2011-08-31: 100 mg via ORAL

## 2011-08-31 MED ORDER — LACTATED RINGERS IV SOLN
INTRAVENOUS | Status: DC
  Administered 2011-08-31 (×5): via INTRAVENOUS

## 2011-08-31 MED ORDER — ONDANSETRON HCL 4 MG PO TABS: 4.0000 mg | ORAL_TABLET | Freq: Four times a day (QID) | ORAL | Status: DC | PRN

## 2011-08-31 MED ORDER — SODIUM CHLORIDE 0.9 % IV SOLN
INTRAVENOUS | Status: DC
  Administered 2011-08-31: 17:00:00 via INTRAVENOUS

## 2011-08-31 MED ORDER — BUPIVACAINE-EPINEPHRINE PF 0.5-1:200000 % IJ SOLN
INTRAMUSCULAR | Status: DC | PRN
  Administered 2011-08-31: 25 mL

## 2011-08-31 MED ORDER — SODIUM CHLORIDE 0.45 % IV SOLN: INTRAVENOUS | Status: DC

## 2011-08-31 MED ORDER — ACETAMINOPHEN 325 MG PO TABS: 650.0000 mg | ORAL_TABLET | Freq: Four times a day (QID) | ORAL | Status: DC | PRN

## 2011-08-31 MED ORDER — BISACODYL 5 MG PO TBEC: 10.0000 mg | DELAYED_RELEASE_TABLET | Freq: Every day | ORAL | Status: DC | PRN

## 2011-08-31 MED ORDER — HYDROMORPHONE HCL PF 1 MG/ML IJ SOLN
0.2500 mg | INTRAMUSCULAR | Status: DC | PRN
  Administered 2011-08-31 – 2011-09-01 (×4): 0.5 mg via INTRAVENOUS

## 2011-08-31 MED ORDER — ONDANSETRON HCL 4 MG/2ML IJ SOLN
INTRAMUSCULAR | Status: DC | PRN
  Administered 2011-08-31: 4 mg via INTRAVENOUS

## 2011-08-31 MED ORDER — CEPHALEXIN 500 MG PO CAPS: 500.0000 mg | ORAL_CAPSULE | Freq: Three times a day (TID) | ORAL | Status: AC

## 2011-08-31 SURGICAL SUPPLY — 89 items
ANCH SUT SWLK 19.1X4.75 (Anchor) ×1 IMPLANT
ANCHOR SUT BIO SW 4.75X19.1 (Anchor) ×1 IMPLANT
BANDAGE ADHESIVE 1X3 (GAUZE/BANDAGES/DRESSINGS) IMPLANT
BLADE AVERAGE 25X9 (BLADE) IMPLANT
BLADE CUTTER MENIS 5.5 (BLADE) IMPLANT
BLADE SURG 15 STRL LF DISP TIS (BLADE) ×2 IMPLANT
BLADE SURG 15 STRL SS (BLADE) ×4
BLADE VORTEX 6.0 (BLADE) ×1 IMPLANT
BUR EGG/OVAL CARBIDE (BURR) IMPLANT
BUR OVAL 6.0 (BURR) ×1 IMPLANT
Biocomposite SwiveLock 4.75 ×1 IMPLANT
CANISTER OMNI JUG 16 LITER (MISCELLANEOUS) ×2 IMPLANT
CANISTER SUCTION 2500CC (MISCELLANEOUS) ×2 IMPLANT
CANNULA 5.75X7 CRYSTAL CLEAR (CANNULA) IMPLANT
CANNULA SHOULDER 7CM (CANNULA) IMPLANT
CANNULA TWIST IN 8.25X7CM (CANNULA) IMPLANT
CANNULA,8.75MM X 7CM ×1 IMPLANT
CLEANER CAUTERY TIP 5X5 PAD (MISCELLANEOUS) IMPLANT
CLOSURE STERI STRIP 1/2 X4 (GAUZE/BANDAGES/DRESSINGS) ×1 IMPLANT
CLOTH BEACON ORANGE TIMEOUT ST (SAFETY) ×2 IMPLANT
CUTTER MENISCUS  4.2MM (BLADE) ×1
CUTTER MENISCUS 4.2MM (BLADE) ×1 IMPLANT
DECANTER SPIKE VIAL GLASS SM (MISCELLANEOUS) IMPLANT
DRAPE INCISE IOBAN 66X45 STRL (DRAPES) ×2 IMPLANT
DRAPE STERI 35X30 U-POUCH (DRAPES) ×2 IMPLANT
DRAPE SURG 17X23 STRL (DRAPES) ×2 IMPLANT
DRAPE U-SHAPE 47X51 STRL (DRAPES) ×2 IMPLANT
DRAPE U-SHAPE 76X120 STRL (DRAPES) ×4 IMPLANT
DRSG PAD ABDOMINAL 8X10 ST (GAUZE/BANDAGES/DRESSINGS) ×2 IMPLANT
DURAPREP 26ML APPLICATOR (WOUND CARE) ×2 IMPLANT
ELECT REM PT RETURN 9FT ADLT (ELECTROSURGICAL) ×2
ELECTRODE REM PT RTRN 9FT ADLT (ELECTROSURGICAL) IMPLANT
FIBER TAPE ×1 IMPLANT
GAUZE SPONGE 4X4 12PLY STRL LF (GAUZE/BANDAGES/DRESSINGS) ×1 IMPLANT
GLOVE BIO SURGEON STRL SZ 6.5 (GLOVE) ×1 IMPLANT
GLOVE BIOGEL M STRL SZ7.5 (GLOVE) ×2 IMPLANT
GLOVE BIOGEL PI IND STRL 7.0 (GLOVE) IMPLANT
GLOVE BIOGEL PI IND STRL 8 (GLOVE) ×2 IMPLANT
GLOVE BIOGEL PI INDICATOR 7.0 (GLOVE) ×1
GLOVE BIOGEL PI INDICATOR 8 (GLOVE) ×2
GLOVE ORTHO TXT STRL SZ7.5 (GLOVE) ×2 IMPLANT
GOWN BRE IMP PREV XXLGXLNG (GOWN DISPOSABLE) ×2 IMPLANT
GOWN PREVENTION PLUS XLARGE (GOWN DISPOSABLE) ×2 IMPLANT
GOWN PREVENTION PLUS XXLARGE (GOWN DISPOSABLE) ×2 IMPLANT
NDL SCORPION (NEEDLE) ×1 IMPLANT
NDL SUT 6 .5 CRC .975X.05 MAYO (NEEDLE) IMPLANT
NEEDLE MAYO TAPER (NEEDLE)
NEEDLE MINI RC 24MM (NEEDLE) IMPLANT
NEEDLE SCORPION (NEEDLE) ×2 IMPLANT
PACK ARTHROSCOPY DSU (CUSTOM PROCEDURE TRAY) ×2 IMPLANT
PACK BASIN DAY SURGERY FS (CUSTOM PROCEDURE TRAY) ×2 IMPLANT
PAD CLEANER CAUTERY TIP 5X5 (MISCELLANEOUS)
PASSER SUT SWANSON 36MM LOOP (INSTRUMENTS) IMPLANT
PENCIL BUTTON HOLSTER BLD 10FT (ELECTRODE) IMPLANT
RESECTOR FULL RADIUS 4.2MM (BLADE) IMPLANT
RESECTOR FULL RADIUS 4.8MM (BLADE) IMPLANT
SLEEVE SCD COMPRESS KNEE MED (MISCELLANEOUS) ×2 IMPLANT
SLING ARM FOAM STRAP LRG (SOFTGOODS) ×1 IMPLANT
SLING ARM FOAM STRAP MED (SOFTGOODS) IMPLANT
SPONGE GAUZE 4X4 12PLY (GAUZE/BANDAGES/DRESSINGS) ×2 IMPLANT
SPONGE LAP 4X18 X RAY DECT (DISPOSABLE) IMPLANT
SUCTION FRAZIER TIP 10 FR DISP (SUCTIONS) IMPLANT
SUT ETHIBOND 2 OS 4 DA (SUTURE) IMPLANT
SUT ETHILON 4 0 PS 2 18 (SUTURE) IMPLANT
SUT FIBERWIRE #2 38 T-5 BLUE (SUTURE)
SUT FIBERWIRE 3-0 18 TAPR NDL (SUTURE)
SUT PROLENE 1 CT (SUTURE) IMPLANT
SUT PROLENE 3 0 PS 2 (SUTURE) ×2 IMPLANT
SUT TIGER TAPE 7 IN WHITE (SUTURE) IMPLANT
SUT VIC AB 0 CT1 27 (SUTURE)
SUT VIC AB 0 CT1 27XBRD ANBCTR (SUTURE) IMPLANT
SUT VIC AB 0 SH 27 (SUTURE) IMPLANT
SUT VIC AB 2-0 SH 27 (SUTURE)
SUT VIC AB 2-0 SH 27XBRD (SUTURE) IMPLANT
SUT VIC AB 3-0 SH 27 (SUTURE)
SUT VIC AB 3-0 SH 27X BRD (SUTURE) IMPLANT
SUT VIC AB 3-0 X1 27 (SUTURE) IMPLANT
SUTURE FIBERWR #2 38 T-5 BLUE (SUTURE) IMPLANT
SUTURE FIBERWR 3-0 18 TAPR NDL (SUTURE) IMPLANT
SYR 3ML 23GX1 SAFETY (SYRINGE) IMPLANT
SYR BULB 3OZ (MISCELLANEOUS) IMPLANT
TAPE FIBER 2MM 7IN #2 BLUE (SUTURE) IMPLANT
TAPE PAPER 3X10 WHT MICROPORE (GAUZE/BANDAGES/DRESSINGS) ×2 IMPLANT
TOWEL OR 17X24 6PK STRL BLUE (TOWEL DISPOSABLE) ×2 IMPLANT
TUBE CONNECTING 20X1/4 (TUBING) ×4 IMPLANT
TUBING ARTHROSCOPY IRRIG 16FT (MISCELLANEOUS) IMPLANT
WAND STAR VAC 90 (SURGICAL WAND) ×2 IMPLANT
WATER STERILE IRR 1000ML POUR (IV SOLUTION) ×2 IMPLANT
YANKAUER SUCT BULB TIP NO VENT (SUCTIONS) IMPLANT

## 2011-08-31 NOTE — Interval H&P Note (Signed)
History and Physical Interval Note:   08/31/2011   1:17 PM   [name]  has presented today for surgery, with the diagnosis of impingement, ac arthrosis, posta tear  The various methods of treatment have been discussed with the patient and family. After consideration of risks, benefits and other options for treatment, the patient has consented to  Procedure(s): SHOULDER ARTHROSCOPY WITH ROTATOR CUFF REPAIR as a surgical intervention .  The patients' history has been reviewed, patient examined, no change in status, stable for surgery.  I have reviewed the patients' chart and labs.  Questions were answered to the patient's satisfaction.   H&P documentation: 08/31/2011   -History and Physical Reviewed  -Patient has been re-examined  -No change in the plan of care  [name],[name]    [name],Paul V  MD

## 2011-08-31 NOTE — H&P (View-Only) (Signed)
To bring overnight bag-all meds ekg and cxr done dr nadels 2/12 

## 2011-08-31 NOTE — Anesthesia Postprocedure Evaluation (Signed)
  Anesthesia Post-op Note  Patient: [name]  Procedure(s) Performed:  SHOULDER ARTHROSCOPY WITH ROTATOR CUFF REPAIR - arthroscopy left shoulder, subacromial decompression, distal clavicle resection, arthroscopic rotator cuff repair  Patient Location: PACU  Anesthesia Type: GA combined with regional for post-op pain  Level of Consciousness: awake, alert  and oriented  Airway and Oxygen Therapy: Patient Spontanous Breathing  Post-op Pain: none  Post-op Assessment: Post-op Vital signs reviewed, Patient's Cardiovascular Status Stable, Respiratory Function Stable, Patent Airway, No signs of Nausea or vomiting, Adequate PO intake and Pain level controlled  Post-op Vital Signs: stable  Complications: No apparent anesthesia complications

## 2011-08-31 NOTE — Anesthesia Preprocedure Evaluation (Addendum)
Anesthesia Evaluation  Patient identified by MRN, date of birth, ID band Patient awake    Reviewed: Allergy & Precautions, H&P , NPO status , Patient's Chart, lab work & pertinent test results  Airway       Dental   Pulmonary COPDCurrent Smoker,          Cardiovascular hypertension, Pt. on medications     Neuro/Psych  Neuromuscular disease    GI/Hepatic hiatal hernia, GERD-  Controlled,  Endo/Other  Diabetes mellitus-, Well Controlled  Renal/GU      Musculoskeletal   Abdominal   Peds  Hematology   Anesthesia Other Findings   Reproductive/Obstetrics                           Anesthesia Physical Anesthesia Plan  ASA: III  Anesthesia Plan: General   Post-op Pain Management:    Induction: Intravenous  Airway Management Planned: Oral ETT  Additional Equipment:   Intra-op Plan:   Post-operative Plan: Extubation in OR  Informed Consent: I have reviewed the patients History and Physical, chart, labs and discussed the procedure including the risks, benefits and alternatives for the proposed anesthesia with the patient or authorized representative who has indicated his/her understanding and acceptance.     Plan Discussed with: CRNA and Surgeon  Anesthesia Plan Comments:         Anesthesia Quick Evaluation

## 2011-08-31 NOTE — Transfer of Care (Signed)
Immediate Anesthesia Transfer of Care Note  Patient: [name]  Procedure(s) Performed:  SHOULDER ARTHROSCOPY WITH ROTATOR CUFF REPAIR - arthroscopy left shoulder, subacromial decompression, distal clavicle resection, arthroscopic rotator cuff repair  Patient Location: PACU  Anesthesia Type: General  Level of Consciousness: sedated  Airway & Oxygen Therapy: Patient Spontanous Breathing and Patient connected to face mask oxygen  Post-op Assessment: Report given to PACU RN and Post -op Vital signs reviewed and stable  Post vital signs: Reviewed and stable  Complications: No apparent anesthesia complications

## 2011-08-31 NOTE — H&P (Signed)
[name] is an 70 y.o. male.   Chief Complaint: Pt. Is a 70 y/o male c/o increasing left shoulder pain HPI: 70 y/o male c/o increasing pain with decreased ROM left shoulder for the past year. Presented to our office for evaluation. MRI revealed left rotator cuff tendinosis/tear. Attempted PT without success. Wishes to proceed with surgery.  Past Medical History  Diagnosis Date  . COPD (chronic obstructive pulmonary disease)   . Cigarette smoker   . HTN (hypertension)   . Hyperlipidemia   . DM (diabetes mellitus)   . Hiatal hernia   . Diverticulosis of colon   . Colon polyp   . Hemorrhoids   . History of renal calculi   . History of BPH   . DJD (degenerative joint disease)   . Neck pain   . Lumbar back pain   . Gastritis   . GERD (gastroesophageal reflux disease)   . IBS (irritable bowel syndrome)   . Shortness of breath   . Chronic kidney disease     stone    Past Surgical History  Procedure Date  . Cervical spine surgery 1986, 1995  . Uvulopalatopharyngoplasty 2001    Dr Annalee Genta  . Cystoscopy 11/03    TURP and stone removal - Dr Logan Bores  . Dental implants 2007  . Lumbar microdiscectomy 12/07    L3-4 by Dr Jordan Likes  . Upper gastrointestinal endoscopy 09/07/2010    erosive gastritis, GERD  . Colonoscopy w/ biopsies and polypectomy 05/06/2009    adenomous polyp, diverticulosis, external hemorrhoids    Family History  Problem Relation Age of Onset  . Diabetes Father   . Colon cancer Mother     ? she had met dis and chemoRx   Social History:  reports that he has been smoking.  He has never used smokeless tobacco. He reports that he drinks about 1.5 ounces of alcohol per week. He reports that he does not use illicit drugs.  Allergies: No Known Allergies  Medications Prior to Admission  Medication Dose Route Frequency Provider Last Rate Last Dose  . ceFAZolin (ANCEF) IVPB 1 g/50 mL premix  1 g Intravenous Once       . fentaNYL (SUBLIMAZE) injection 50-100 mcg   50-100 mcg Intravenous PRN Bedelia Person, MD      . lactated ringers infusion   Intravenous Continuous Constance Goltz, MD 20 mL/hr at 08/31/11 1158    . midazolam (VERSED) injection 1-2 mg  1-2 mg Intravenous PRN Bedelia Person, MD       Medications Prior to Admission  Medication Sig Dispense Refill  . aspirin 81 MG tablet Take 81 mg by mouth daily.        . bifidobacterium infantis (ALIGN) capsule Take 1 capsule by mouth daily.  28 capsule  0  . gemfibrozil (LOPID) 600 MG tablet Take 600 mg by mouth daily.       . metFORMIN (GLUCOPHAGE) 500 MG tablet TAKE  1/2 TABLET BY MOUTH TWICE DAILY  90 tablet  0  . omeprazole (PRILOSEC) 40 MG capsule Take 40 mg by mouth daily.        . potassium citrate (UROCIT-K) 10 MEQ (1080 MG) SR tablet Take 1 tablet (10 mEq total) by mouth daily.  90 tablet  3  . sildenafil (VIAGRA) 100 MG tablet Take 100 mg by mouth daily as needed.        . simvastatin (ZOCOR) 20 MG tablet Take 20 mg by mouth at bedtime.        Marland Kitchen  acyclovir (ZOVIRAX) 400 MG tablet Take 1 tablet (400 mg total) by mouth 2 (two) times daily.  180 tablet  3    No results found for this or any previous visit (from the past 48 hour(s)).  No results found.   Pertinent items are noted in HPI.  Blood pressure 118/68, pulse 62, temperature 98.7 F (37.1 C), temperature source Oral, resp. rate 20, height 5\' 11"  (1.803 m), weight 77.111 kg (170 lb), SpO2 97.00%.  General appearance: alert Head: Normocephalic, without obvious abnormality Neck: supple, symmetrical, trachea midline Resp: clear to auscultation bilaterally Cardio: regular rate and rhythm, S1, S2 normal, no murmur, click, rub or gallop GI: soft, non-tender; bowel sounds normal; no masses,  no organomegaly Extremities:left shoulder reveals limited ROM with positive impingement signs and pain on cross body adduction. N/V intact. Pulses: 2+ and symmetric Skin: Skin color, texture, turgor normal. 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

## 2011-08-31 NOTE — Brief Op Note (Signed)
08/31/2011  3:26 PM  PATIENT:  Leonia Corona  70 y.o. male  PRE-OPERATIVE DIAGNOSIS:  Left shoulder impingement with acrmioclavicular arthrosis, rotator cuff tear  POST-OPERATIVE DIAGNOSIS:  Left shoulder impingement with acrmioclavicular arthrosis, rotator cuff tear  PROCEDURE:  Procedure(s): SHOULDER ARTHROSCOPY WITH ROTATOR CUFF REPAIR (Arthroscopic repair of 65% PASTA Supraspinatus tear) Sub acromial decompression, Arthroscopic distal clavical resection Labral debridement  SURGEON:  Surgeon(s): Wyn Forster., MD  PHYSICIAN ASSISTANT:   ASSISTANTS: Annye Rusk, P.A.2-C  ANESTHESIA:   general  EBL:  Total I/O In: 1000 [I.V.:1000] Out: -   BLOOD ADMINISTERED:none  DRAINS: none   LOCAL MEDICATIONS USED:  NONE  SPECIMEN:  No Specimen  DISPOSITION OF SPECIMEN:  N/A  COUNTS:  YES  TOURNIQUET:  * No tourniquets in log *  DICTATION: .Other Dictation: Dictation Number [id number]  PLAN OF CARE: Admit for overnight observation  PATIENT DISPOSITION:  To [facility] for overnight observation

## 2011-08-31 NOTE — Anesthesia Procedure Notes (Addendum)
Anesthesia Regional Block:  Interscalene brachial plexus block  Pre-Anesthetic Checklist: ,, timeout performed, Correct Patient, Correct Site, Correct Laterality, Correct Procedure, Correct Position, site marked, Risks and benefits discussed,  Surgical consent,  Pre-op evaluation,  At surgeon's request and post-op pain management  Laterality: Left  Prep: chloraprep       Needles:  Injection technique: Single-shot  Needle Type: Echogenic Stimulator Needle     Needle Length: 10cm 10 cm Needle Gauge: 22 and 22 G    Additional Needles:  Procedures: ultrasound guided and nerve stimulator Interscalene brachial plexus block  Nerve Stimulator or Paresthesia:  Response: 0.5 mA,   Additional Responses:   Narrative:  Start time: 08/31/2011 12:34 PM End time: 08/31/2011 12:43 PM Injection made incrementally with aspirations every 5 mL.  Performed by: Personally    Performed by: Ronnette Hila

## 2011-08-31 NOTE — Op Note (Signed)
Op note dictated 08/31/11 MRN:  [id number] Dictation number:  [id number]

## 2011-09-01 LAB — GLUCOSE, CAPILLARY
Glucose-Capillary: 123 mg/dL — ABNORMAL HIGH (ref 70–99)
Glucose-Capillary: 113 mg/dL — ABNORMAL HIGH (ref 70–99)

## 2011-09-01 MED ORDER — ZOLPIDEM TARTRATE 10 MG PO TABS: 10.0000 mg | ORAL_TABLET | Freq: Every evening | ORAL | Status: DC | PRN

## 2011-09-01 MED ORDER — ZOLPIDEM TARTRATE 10 MG PO TABS
10.0000 mg | ORAL_TABLET | Freq: Every evening | ORAL | Status: AC | PRN
  Administered 2011-08-31: 10 mg via ORAL

## 2011-09-01 NOTE — Op Note (Signed)
NAME:  [name], [name]                   ACCOUNT NO.:  MEDICAL RECORD NO.:  [id number]  LOCATION:                                 FACILITY:  PHYSICIAN:  Katy Fitch. Koula Venier, M.D.      DATE OF BIRTH:  DATE OF PROCEDURE:  08/31/2011 DATE OF DISCHARGE:                              OPERATIVE REPORT   PREOPERATIVE DIAGNOSIS:  MRI documented articular surface rotator cuff tear left shoulder with acromioclavicular degenerative arthritis and rule out adhesive capsulitis.  POSTOPERATIVE DIAGNOSES: 1. Minimal adhesive capsulitis. 2. A 65% partial articular supraspinatus tendon avulsion lesion of     supraspinatus tendon, documented arthroscopically. 3. Acromioclavicular degenerative arthritis. 4. 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  OPERATION: 1. Diagnostic arthroscopy left glenohumeral joint. 2. Arthroscopic debridement of partial-thickness biceps tear,     extensive labral degenerative tear, and arthroscopic reconstruction     of the 65% supraspinatus partial articular supraspinatus tendon     avulsion lesion utilizing a swivel lock at the articular margin,     and a mattress suture of #2 FiberWire and a suture bridge of     FiberTape/#2 FiberWire. 3. Arthroscopic subacromial decompression with bursectomy,     coracoacromial ligament release and acromioplasty. 4. Arthroscopic distal clavicle resection. 5. Extensive debridement of synovitis, adhesive capsulitis tissues,     and labral tear.  OPERATING SURGEON:  Katy Fitch. Kylar Speelman, MD  ASSISTANT:  Marveen Reeks Dasnoit, PA-C  ANESTHESIA:  General endotracheal supplemented by a left interscalene block.  SUPERVISING ANESTHESIOLOGIST:  Bedelia Person, MD  INDICATIONS:  [name] is a 70 year old gentleman referred through the courtesy of Dr. Alroy Dust for evaluation and management of shoulder pain.  He is a retired [employer] who is very active physically.  He had chronic  pain in his left shoulder, weakness, limited abduction, external rotation, and night discomfort.  Clinical examination suggested impingement AC arthropathy and a probable rotator cuff tear.  There was some discussion of possible adhesive capsulitis due to his loss of external rotation and abduction.  He was sent for an MRI that documented a PASTA type articular surface rotator cuff tear.  We attempted to treat him nonoperatively for a period of time without success.  After a prolonged pain and sleep impairment, he requested that we proceed with surgical treatment at this time.  Preoperatively, he was reminded of the potential risks and benefits of surgery including  risks of infection, anesthetic complication, neurovascular injury, failure to relieve all his pain.  He understands that we have no spare parts and that with rotator cuff repair at times we are faced with challenges due to poor vascularity of the rotator cuff.  We had a detailed discussion whether or not we would repair the shoulder arthroscopically or with open technique.  With the PASTA lesion we can often perform a very satisfactory repair arthroscopically.  In the holding area, we have discussed with [name] and his wife that our choices will be made intraoperatively when examining the tendon directly.  Dr. Gypsy Balsam provided detailed anesthesia informed consent prior to placement of the interscalene block in the holding area.  This was accomplished without complication.  PROCEDURE:  [name] was brought to room 6 of the [facility] and placed in supine position upon the operating table.  Under Dr. Burnett Corrente direct supervision, general endotracheal anesthesia was induced.  He was carefully positioned in a beach chair position with the aid of a torso and head holder designed for shoulder arthroscopy. Examination of the left shoulder under anesthesia revealed the shoulder was stable in all planes of  stress testing.  He did not show signs of a significant contracture.  He had combined elevation of 175 degrees, external rotation at 90 degrees, abduction at 90 degrees, internal rotation of 80 degrees, and extension to the interscapular plane at 90 degrees abduction.  The left arm and forequarter were then prepped with DuraPrep and draped with impervious arthroscopy drapes.  After routine surgical time-out, a scope was placed with a switching stick anteriorly and posterior placement through the standard posterior viewing portal.  Diagnostic arthroscopy revealed abundant synovitis, early adhesive capsulitis, granulation tissue, a very degenerative labrum extending from 1 o'clock superiorly to 3 o'clock anteriorly, and a very inflamed degree of synovitis at the rotator interval and surrounding the subscapularis tendon.  An anterior portal was created under direct vision followed by use of a 4.2-mm suction shaver to debride all the synovitis and perform a capsulectomy, debridement of the unstable labrum and a nerve hook was used to test the biceps origin.  There was no evidence of a SLAP lesion or unstable biceps origin.  There was a 30% tear of the biceps that was debrided to a stable margin.  The rotator cuff tendons were then inspected.  The upper 10% of the subscapularis was degenerative and simply debrided.  The subscapularis was quite sounded at its primary insertion at the lesser tuberosity. The supraspinatus had a 65% PASTA tear that was quite degenerative.  The infraspinatus and teres minor were normal.  The inferior recess was normal.  The hyaline articular cartilage surfaces on the humeral head and glenoid were normal.  The scope was then removed from the glenohumeral joint and placed in subacromial space.  Extensive bursectomy allowed visualization of the cuff.  There was no bursal side tear.  There was a type 2 acromion and unfavorable acromial morphology at the [facility] joint.   A suction bur was used to level the acromion to a type 1 morphology followed by resection of the distal centimeter of the clavicle.  Hemostasis was achieved with bipolar cautery including the acromial branch within the deltoid muscle.  After further bursectomy and hemostasis, we proceeded to repair the PASTA lesion with trans-tendon technique sounding our repair site with a spinal needle followed by placement of a swivel lock with a FiberTape trans-tendinous through the supraspinatus tendon.  A very satisfactory footprint was created directly posterior to the biceps tendon.  A bird beak penetrator was then used to remove one limb of the #2 FiberWire to create a mattress suture at the articular margin.  The two tails of the FiberTape placed in the swivel lock were then used to create a suture bridge.  After very satisfactory placement of the anchor intra-articularly, this was documented with digital camera followed by placement of the scope in the subacromial space.  The #2 FiberWire was used with mattress technique to inset the margin of the repair followed by use of a lateral swivel lock to inset all 4 suture tails creating a suture bridge.  The scope was replaced the final time at the  glenohumeral joint confirming a very satisfactory anatomic repair of the tendon.  There were no sutures following the long head of the biceps.  After further irrigation, the arthroscopic clip was removed.  The portals were repaired with intradermal 3-0 Prolene and Steri-Strips. Mr. Gabay was placed in a sling, awakened from general anesthesia, and transferred to the recovery room with stable vital signs.  [facility] will be [facility] for observation of his vital signs, IV Ancef 1 g q.6 hours x3 doses and appropriate analgesics in the form of p.o. and IV Dilaudid.  We will discharge him home with prescriptions for Dilaudid, Keflex as a prophylactic antibiotic, and possible use of  milk of magnesia.     Katy Fitch Yaakov Saindon, M.D.     RVS/MEDQ  D:  08/31/2011  T:  08/31/2011  Job:  [id number]  cc:   Lonzo Cloud. Kriste Basque, MD

## 2011-09-21 ENCOUNTER — Other Ambulatory Visit: Payer: Self-pay | Admitting: Pulmonary Disease

## 2011-10-11 ENCOUNTER — Other Ambulatory Visit: Payer: Self-pay | Admitting: Pulmonary Disease

## 2011-10-20 DIAGNOSIS — M25519 Pain in unspecified shoulder: Secondary | ICD-10-CM | POA: Diagnosis not present

## 2011-10-20 DIAGNOSIS — S43429A Sprain of unspecified rotator cuff capsule, initial encounter: Secondary | ICD-10-CM | POA: Diagnosis not present

## 2011-10-25 DIAGNOSIS — S43429A Sprain of unspecified rotator cuff capsule, initial encounter: Secondary | ICD-10-CM | POA: Diagnosis not present

## 2011-10-25 DIAGNOSIS — M25519 Pain in unspecified shoulder: Secondary | ICD-10-CM | POA: Diagnosis not present

## 2011-11-01 DIAGNOSIS — S43429A Sprain of unspecified rotator cuff capsule, initial encounter: Secondary | ICD-10-CM | POA: Diagnosis not present

## 2011-11-01 DIAGNOSIS — M25519 Pain in unspecified shoulder: Secondary | ICD-10-CM | POA: Diagnosis not present

## 2011-11-04 DIAGNOSIS — S43429A Sprain of unspecified rotator cuff capsule, initial encounter: Secondary | ICD-10-CM | POA: Diagnosis not present

## 2011-11-04 DIAGNOSIS — M25519 Pain in unspecified shoulder: Secondary | ICD-10-CM | POA: Diagnosis not present

## 2011-11-08 DIAGNOSIS — S43429A Sprain of unspecified rotator cuff capsule, initial encounter: Secondary | ICD-10-CM | POA: Diagnosis not present

## 2011-11-08 DIAGNOSIS — M25519 Pain in unspecified shoulder: Secondary | ICD-10-CM | POA: Diagnosis not present

## 2011-11-11 DIAGNOSIS — M25519 Pain in unspecified shoulder: Secondary | ICD-10-CM | POA: Diagnosis not present

## 2011-11-11 DIAGNOSIS — S43429A Sprain of unspecified rotator cuff capsule, initial encounter: Secondary | ICD-10-CM | POA: Diagnosis not present

## 2011-11-15 DIAGNOSIS — S43429A Sprain of unspecified rotator cuff capsule, initial encounter: Secondary | ICD-10-CM | POA: Diagnosis not present

## 2011-11-15 DIAGNOSIS — M25519 Pain in unspecified shoulder: Secondary | ICD-10-CM | POA: Diagnosis not present

## 2011-11-18 DIAGNOSIS — M25519 Pain in unspecified shoulder: Secondary | ICD-10-CM | POA: Diagnosis not present

## 2011-11-18 DIAGNOSIS — S43429A Sprain of unspecified rotator cuff capsule, initial encounter: Secondary | ICD-10-CM | POA: Diagnosis not present

## 2011-11-22 DIAGNOSIS — M25519 Pain in unspecified shoulder: Secondary | ICD-10-CM | POA: Diagnosis not present

## 2011-11-22 DIAGNOSIS — S43429A Sprain of unspecified rotator cuff capsule, initial encounter: Secondary | ICD-10-CM | POA: Diagnosis not present

## 2011-11-25 DIAGNOSIS — M25519 Pain in unspecified shoulder: Secondary | ICD-10-CM | POA: Diagnosis not present

## 2011-11-25 DIAGNOSIS — S43429A Sprain of unspecified rotator cuff capsule, initial encounter: Secondary | ICD-10-CM | POA: Diagnosis not present

## 2011-12-01 DIAGNOSIS — M25519 Pain in unspecified shoulder: Secondary | ICD-10-CM | POA: Diagnosis not present

## 2011-12-01 DIAGNOSIS — S43429A Sprain of unspecified rotator cuff capsule, initial encounter: Secondary | ICD-10-CM | POA: Diagnosis not present

## 2011-12-08 DIAGNOSIS — M25519 Pain in unspecified shoulder: Secondary | ICD-10-CM | POA: Diagnosis not present

## 2011-12-08 DIAGNOSIS — S43429A Sprain of unspecified rotator cuff capsule, initial encounter: Secondary | ICD-10-CM | POA: Diagnosis not present

## 2011-12-13 ENCOUNTER — Other Ambulatory Visit (INDEPENDENT_AMBULATORY_CARE_PROVIDER_SITE_OTHER): Payer: Medicare Other

## 2011-12-13 ENCOUNTER — Encounter: Payer: Self-pay | Admitting: Pulmonary Disease

## 2011-12-13 ENCOUNTER — Ambulatory Visit (INDEPENDENT_AMBULATORY_CARE_PROVIDER_SITE_OTHER): Payer: Medicare Other | Admitting: Pulmonary Disease

## 2011-12-13 ENCOUNTER — Ambulatory Visit (INDEPENDENT_AMBULATORY_CARE_PROVIDER_SITE_OTHER)
Admission: RE | Admit: 2011-12-13 | Discharge: 2011-12-13 | Disposition: A | Discharge status: Home / Self Care | Payer: Medicare Other | Source: Ambulatory Visit | Attending: Pulmonary Disease | Admitting: Pulmonary Disease

## 2011-12-13 VITALS — BP 120/74 | HR 87 | Temp 97.3°F | Ht 71.0 in | Wt 175.0 lb

## 2011-12-13 DIAGNOSIS — M159 Polyosteoarthritis, unspecified: Secondary | ICD-10-CM

## 2011-12-13 DIAGNOSIS — Z87442 Personal history of urinary calculi: Secondary | ICD-10-CM

## 2011-12-13 DIAGNOSIS — Z0000 Encounter for general adult medical examination without abnormal findings: Secondary | ICD-10-CM | POA: Diagnosis not present

## 2011-12-13 DIAGNOSIS — J4489 Other specified chronic obstructive pulmonary disease: Secondary | ICD-10-CM

## 2011-12-13 DIAGNOSIS — Z87898 Personal history of other specified conditions: Secondary | ICD-10-CM

## 2011-12-13 DIAGNOSIS — E785 Hyperlipidemia, unspecified: Secondary | ICD-10-CM

## 2011-12-13 DIAGNOSIS — F419 Anxiety disorder, unspecified: Secondary | ICD-10-CM

## 2011-12-13 DIAGNOSIS — E119 Type 2 diabetes mellitus without complications: Secondary | ICD-10-CM

## 2011-12-13 DIAGNOSIS — Z860100 Personal history of colon polyps, unspecified: Secondary | ICD-10-CM

## 2011-12-13 DIAGNOSIS — F17200 Nicotine dependence, unspecified, uncomplicated: Secondary | ICD-10-CM | POA: Diagnosis not present

## 2011-12-13 DIAGNOSIS — K219 Gastro-esophageal reflux disease without esophagitis: Secondary | ICD-10-CM

## 2011-12-13 DIAGNOSIS — J449 Chronic obstructive pulmonary disease, unspecified: Secondary | ICD-10-CM

## 2011-12-13 DIAGNOSIS — I10 Essential (primary) hypertension: Secondary | ICD-10-CM

## 2011-12-13 DIAGNOSIS — F411 Generalized anxiety disorder: Secondary | ICD-10-CM | POA: Diagnosis not present

## 2011-12-13 DIAGNOSIS — K589 Irritable bowel syndrome without diarrhea: Secondary | ICD-10-CM

## 2011-12-13 LAB — HEPATIC FUNCTION PANEL
ALT: 17 U/L (ref 0–53)
AST: 18 U/L (ref 0–37)
Albumin: 4.4 g/dL (ref 3.5–5.2)

## 2011-12-13 LAB — HEMOGLOBIN A1C: Hgb A1c MFr Bld: 6.7 % — ABNORMAL HIGH (ref 4.6–6.5)

## 2011-12-13 LAB — LIPID PANEL
Cholesterol: 139 mg/dL (ref 0–200)
LDL Cholesterol: 78 mg/dL (ref 0–99)
Triglycerides: 114 mg/dL (ref 0.0–149.0)
VLDL: 22.8 mg/dL (ref 0.0–40.0)

## 2011-12-13 LAB — TSH: TSH: 2.06 u[IU]/mL (ref 0.35–5.50)

## 2011-12-13 LAB — CBC WITH DIFFERENTIAL/PLATELET
Basophils Relative: 0.3 % (ref 0.0–3.0)
Eosinophils Relative: 4 % (ref 0.0–5.0)
Lymphocytes Relative: 21 % (ref 12.0–46.0)
Monocytes Absolute: 0.4 10*3/uL (ref 0.1–1.0)
Monocytes Relative: 6.5 % (ref 3.0–12.0)
Neutrophils Relative %: 68.2 % (ref 43.0–77.0)
Platelets: 265 10*3/uL (ref 150.0–400.0)
RBC: 4.83 Mil/uL (ref 4.22–5.81)
WBC: 6.1 10*3/uL (ref 4.5–10.5)

## 2011-12-13 LAB — BASIC METABOLIC PANEL
BUN: 19 mg/dL (ref 6–23)
Chloride: 103 mEq/L (ref 96–112)
Creatinine, Ser: 0.9 mg/dL (ref 0.4–1.5)
Glucose, Bld: 115 mg/dL — ABNORMAL HIGH (ref 70–99)
Potassium: 4.7 mEq/L (ref 3.5–5.1)

## 2011-12-13 LAB — PSA: PSA: 1.94 ng/mL (ref 0.10–4.00)

## 2011-12-13 MED ORDER — ACYCLOVIR 400 MG PO TABS: 400.0000 mg | ORAL_TABLET | Freq: Two times a day (BID) | ORAL | Status: DC

## 2011-12-13 MED ORDER — SILDENAFIL CITRATE 100 MG PO TABS: 100.0000 mg | ORAL_TABLET | Freq: Every day | ORAL | Status: DC | PRN

## 2011-12-13 MED ORDER — SIMVASTATIN 20 MG PO TABS: 20.0000 mg | ORAL_TABLET | Freq: Every day | ORAL | Status: DC

## 2011-12-13 MED ORDER — HYDROCHLOROTHIAZIDE 50 MG PO TABS: ORAL_TABLET | ORAL | Status: DC

## 2011-12-13 MED ORDER — POTASSIUM CITRATE ER 10 MEQ (1080 MG) PO TBCR: 10.0000 meq | EXTENDED_RELEASE_TABLET | Freq: Every day | ORAL | Status: DC

## 2011-12-13 MED ORDER — METFORMIN HCL 500 MG PO TABS: ORAL_TABLET | ORAL | Status: DC

## 2011-12-13 MED ORDER — GEMFIBROZIL 600 MG PO TABS: 600.0000 mg | ORAL_TABLET | Freq: Every day | ORAL | Status: DC

## 2011-12-13 MED ORDER — OMEPRAZOLE 40 MG PO CPDR: 40.0000 mg | DELAYED_RELEASE_CAPSULE | Freq: Every day | ORAL | Status: DC

## 2011-12-13 NOTE — Patient Instructions (Signed)
Today we updated your med list in our EPIC system...    Continue your current medications the same...    We refilled your meds per request...  Today we did your follow up CXR & fasting blood work...    Please call the [facility] in a few days for your results...    Dial [id number] & when prompted enter your patient number followed by the # symbol...    Your patient number is:  [id number]#  Call for any questions...  Let's continue our 6 month f/u visits, sooner if needed for problems.Marland KitchenMarland Kitchen

## 2011-12-13 NOTE — Progress Notes (Addendum)
Subjective:    Patient ID: [name], male    DOB: April 04, 1941, 71 y.o.   MRN: [id number]  HPI 71 y/o WM here for a follow up visit... he has multiple medical problems including COPD & he still smokes a few cigs daily;  HBP;  Hyperlipidemia;  DM;  HH w/ decr LES;  Divertics & colon polyps;  hx kidney stones/ BPH w/ TURP followed by DrEvans & DrKimbrough;  DJD w/ neck & back pain- s/p lumbar diskectomy by DrPool...  ~  December 11, 2010:  He notes that his "stomach problems" are "improved but not fixed" but he is off meds & encouraged to restart Omep40mg /d & f/u w/ DrGessner...  he also notes sl cough, yellow sputum but denies f/c/s, incr SOB/ CP/ etc (we discussed checking CXR- clear, NAD) & rx w/ ZPak)... he had ?migraine HA yest that resolved spont... finally relates prob w/ left shoulder x 6-8wks & rather than rx w/ NSAID which he should avoid, we will refer to DrSypher for eval...    Still smoking 1-3 cig/d & encouraged to quit completely;  BP controlled on diet/ exercise/ & HCTZ perscribed for his kidney stones;  FLP looks good on Simva20 + Lopid600;  DM= fair control on diet alone (he stopped his Metformin on his own) w/ A1c up to 7.3 off the med;  KidStones followed by drKimbrough who agreed w/ weaning the diuretic/ K+ therapy(on HCTZ50- 1/2 daily + one UrorcitK/d...  ~  June 09, 2011:  640mo ROV & he has mult complaints> notes persistent vague GI symptoms like several soft BMs every AM, intermittent crampy pain, sounds like IBS- saw DrGessner last yr (stool studies neg), notes reviewed, in view of on-going symptoms rec f/u w/ him...  Had CTAbd 11/11> NAD; EGD showed gastitis, duodenitis, neg HPylori & treated w/ PPI (Prilosec) but no better he says so he stopped it...    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  Rx written for shingles vaccine to fill at pharmacy clinic...  ~  December 13, 2011:  640mo ROV & he continues to do well overall> no new complaints or concerns... 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  Notes some stress w/ son- seizures (doing about the same), and daugh (chr lyme w/ stem cell tx in [place] last yr)...    He saw DrGessner for GI f/u 10/12> IBS & GERD w/ rec to stay on Omep40mg /d & try Align; he took it for awhile, no real change & stopped it but rec to restart any time his BMs are loose or irreg...    He had left shoulder surg 11/12 by DrSypher> arthroscopy, debridement, & rotator cuff repair; went thru PT after that & now doing very well, no pain & good ROM.Marland KitchenMarland Kitchen CXR 2/13 showed normal heart size & clear lungs, DJD in spine, NAD... LABS 2/13:  FLP- at  goals on Simva20+Lopid600 x HDL=39;  Chems- ok x BS=115 A1c=6.7;  CBC- wnl;  TSH=2.06;  PSA=1.94 Note> prev PSA values were 0.63 & 0.49 (?incr PSA velocity & rec recheck 68mo)...   Problem List:   S/P ENT SURG by DrShoemaker 2000 for SNORING- ?UPPP w/ resolution of snoring problem...  COPD (ICD-496) - smoker w/ hx of intermittent bronchitic problems, no chronic dyspnea, cough, sputum, etc; not on regular meds and hasn't required antibiotics in quite some time... ~  PFT's 2000 showed FVC= 3.33 (74%), FEV1= 1.76 (45%), %1sec= 52, mid-flows= 34% pred. ~  baseline CXR w/ sl hyperinflation, clear, NAD.Marland Kitchen. ~  f/u CXR 2/11 shows no acute changes... ~  CXR 2/12 showed clear lungs, NAD.Marland Kitchen. ~  CXR 2/13 showed normal heart size & clear lungs, DJD in spine,  NAD...  CIGARETTE SMOKER (ICD-305.1) - still smokes on occas but not regularly per pt- eg[place] the [place]... he was able to decr smoking w/ Chantix Rx... he knows that he needs to discontinue all smoking.  HYPERTENSION (ICD-401.9) - diet controlled, but he was placed on HCTZ (50mg - now taking 1/2 tab daily) by DrEvans for kidney stone prevention in about 2003, and he wants to stop it... asked to monitor BP at home w/ digital cuff...   ~  8/12: BP today = 118/80> denies HA, fatigue, visual changes, CP, palipit, dizziness, syncope, dyspnea, edema, etc...  ~  2/13: BP= 120/74 & he remains mostly asymptomatic...  HYPERLIPIDEMIA (ICD-272.4) - on SIMVASTATIN 20mg /d & LOPID 600mg /d... his TG level was [phone] range in 1995-6 w/ Gemfibrizol started then... ~  FLP 1/07 on Lopid showed TChol 207, TG 77, HDL 41, LDL 147... rec> Simva20 added. ~  FLP 11/07 showed TChol 141, TG 82, HDL 37, LDL 87 ~  FLP 7/08 showed TChol 157, TG 99, HDL 44, LDL 94... Stable on Simva20 + Lopid600. ~  FLP 5/10 showed TChol 133, TG 97, HDL 43, LDL 70 ~  FLP 2/11 showed TChol 139, TG 105, HDL 47, LDL 71... Stable on Simva20 + Lopid600. ~  FLP 8/12 showed TChol 145, TG 99, HDL 49, LDL 76 ~  FLP 2/13 showed TChol 139, TG 114, HDL 39, LDL 78... Stable on Simva20 + Lopid600  DIABETES MELLITUS (ICD-250.00) - + FamHx w/ DM in father> pt diet controlled in past- but in 2007 he took Medrol Rx from [employer], Neurosurg for LBP and developed problems while on a mission trip to [place]... Metformin started then and currently taking METFORMIN 500mg  Qam... ~  labs 11/07 showed BS= 98, A1c= 7.1 ~  labs 7/08 showed BS= 135, A1c= 6.7.Marland KitchenMarland Kitchen Continue Metformin Rx. ~  labs 12/08 showed BS= 111, A1c= 6.9 ~  labs 5/10 showed BS= 107, A1c= 6.5.Marland KitchenMarland Kitchen Continue Metformin + diet. ~  labs 2/11 showed BS= 114 ~  Labs 8/12 showed BS= 131, A1c= 6.7.Marland KitchenMarland Kitchen Continue Metformin & diet. ~  Labs 2/13 showed BS= 115, A1c= 6.7  HIATAL HERNIA (ICD-553.3) - he had an EGD  in 1987 showing a patulous LES, GERD, antritis... ~  EGD 11/11 by DrGessner showed esophagitis, gastritis, duodenitis> neg HPylori, treated w/ Omeprazole 40mg /d...  DIVERTICULOSIS OF COLON (ICD-562.10) >> R/O IBS... COLONIC POLYPS (ICD-211.3)  HEMORRHOIDS (ICD-455.6) - there is a pos family hx of colon cancer in his mother... ~  colonoscopy 7/04 showed divertics, hems, no polyps... f/u planned 5 yrs. ~  f/u colon 7/10 DrGessner w/ 6 polyps removed- max 8mm- mult tubular adenomas removed & f/u planned 22yrs. ~  CTAbd  11/11 showed mild bibasilar atx, right hepatic cyst, NAD...  RENAL CALCULUS, HX OF (ICD-V13.01) - on HCTZ 50mg /d and Urocit-K Bid per DrEvans... he wants to stop these meds but prefers to have Urology appt 1st to discuss this & we will set this up for him. ~  s/p right ureteroscopy 2002 by DrEvans for stone...  BENIGN PROSTATIC HYPERTROPHY, HX OF (ICD-V13.8) - s/p TURP & removal of bladder stone in 2003 by DrEvans... 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 Hx of NECK PAIN (ICD-723.1) - s/p CSpine surg x2 in 1986 & 1995... Hx of BACK PAIN, LUMBAR (ICD-724.2) - s/p Lumbar microdiscectomy 12/07 by DrPool...  Health Maintenance: ~  GI= DrGessner ~  GU= prev DrEvans & we will refer to [facility] per his request... DRE neg, PSA pending. ~  Immunizations:  gets yearly Flu vaccine... Tetanus? probably before his [place] trip in 2006... PNEUMOVAX given 2/11.   Past Surgical History  Procedure Date  . Cervical spine surgery 1986, 1995  . Uvulopalatopharyngoplasty 2001    Dr Annalee Genta  . Cystoscopy 11/03    TURP and stone removal - Dr Logan Bores  . Dental implants 2007  . Lumbar microdiscectomy 12/07    L3-4 by Dr Jordan Likes  . Upper gastrointestinal endoscopy 09/07/2010    erosive gastritis, GERD  . Colonoscopy w/ biopsies and polypectomy 05/06/2009    adenomous polyp, diverticulosis, external  hemorrhoids    Outpatient Encounter Prescriptions as of 12/13/2011  Medication Sig Dispense Refill  . acyclovir (ZOVIRAX) 400 MG tablet Take 1 tablet (400 mg total) by mouth 2 (two) times daily.  180 tablet  3  . aspirin 81 MG tablet Take 81 mg by mouth daily.        Marland Kitchen azithromycin (ZITHROMAX) 250 MG tablet TAKE TWO TABLETS AT ONCE TODAY , THEN TAKE ONE TABLET - DAILY FOR FOUR DAYS  6 tablet  0  . bifidobacterium infantis (ALIGN) capsule Take 1 capsule by mouth daily.  28 capsule  0  . gemfibrozil (LOPID) 600 MG tablet Take 600 mg by mouth daily.       . hydrochlorothiazide (HYDRODIURIL) 50 MG tablet TAKE 1 TABLET BY MOUTH ONCE A DAY  90 tablet  PRN  . HYDROmorphone (DILAUDID) 2 MG tablet 1 or 2 tabs every 4 hours as needed for pain  30 tablet  0  . metFORMIN (GLUCOPHAGE) 500 MG tablet TAKE  1/2 TABLET BY MOUTH TWICE DAILY  90 tablet  3  . omeprazole (PRILOSEC) 40 MG capsule Take 40 mg by mouth daily.        . potassium citrate (UROCIT-K) 10 MEQ (1080 MG) SR tablet Take 1 tablet (10 mEq total) by mouth daily.  90 tablet  3  . sildenafil (VIAGRA) 100 MG tablet Take 100 mg by mouth daily as needed.        . simvastatin (ZOCOR) 20 MG tablet Take 20 mg by mouth at bedtime.          No Known Allergies   Current Medications, Allergies, Past Medical History, Past Surgical History, Family History, and Social History were reviewed in [employer] record.    Review of Systems         See HPI - all other systems neg except as noted...       The patient complains of abdominal discomfort.  The patient denies anorexia, fever, weight gain, vision loss, decreased hearing, hoarseness, chest  pain, syncope, dyspnea on exertion, peripheral edema, prolonged cough, headaches, hemoptysis, melena, hematochezia, severe indigestion/heartburn, hematuria, incontinence, muscle weakness, suspicious skin lesions, transient blindness, difficulty walking, depression, unusual weight change, abnormal  bleeding, enlarged lymph nodes, and angioedema.     Objective:   Physical Exam     WD, WN, 71 y/o WM in NAD... GENERAL:  Alert & oriented; pleasant & cooperative... HEENT:  [place]/AT, EOM-wnl, PERRLA, EACs-clear, TMs-wnl, NOSE-clear, THROAT-clear & wnl. NECK:  Supple w/ decrROM; no JVD; normal carotid impulses w/o bruits; no thyromegaly or nodules palpated; no lymphadenopathy. CHEST:  Clear to P & A; without wheezes/ rales/ or rhonchi heard... HEART:  Regular Rhythm; without murmurs/ rubs/ or gallops detected... ABDOMEN:  Soft & nontender; normal bowel sounds; no organomegaly or masses palpated... EXT: without deformities, mild arthritic changes; no varicose veins/ venous insuffic/ or edema. NEURO: CNs intact, no focal neuro deficits... DERM:  No lesions noted; no rash etc...  RADIOLOGY DATA:  Reviewed in the EPIC EMR & discussed w/ the patient...  LABORATORY DATA:  Reviewed in the EPIC EMR & discussed w/ the patient...    Assessment & Plan:   ?HSV1, recurrent>  We will check HSV viral titers and plan Rx w/ Acyclovir 400mg - 5x per day for outbreak & decr to Bid for suppression...  COPD, ?ex-smoker>  Reminded to quit completely; denies cough, phlegm, SOB, CP, etc; he is not inclined to take meds or do f/u breathing tests, etc...  HBP>  Controlled on diet + the HCT 25mg /d...  HYPERLIPID>  Labs look good on Simva20 + Lopid600, continue same...  DM>  Labs stable w/ A1c= 6.7 on the Metform500/d + diet etc...  GI> Hx of HH, GERD, Divertics, IBS, etc>  He saw DrGessner w/ rec to continue PPI & Align...  Kidney Stones>  Known nephrolithiasis in kidneys, no colick attacks or ureteral stones recently...  DJD w/ neck & back pain intermittently> he uses OTC meds as needed.Marland KitchenMarland Kitchen

## 2011-12-15 DIAGNOSIS — S43429A Sprain of unspecified rotator cuff capsule, initial encounter: Secondary | ICD-10-CM | POA: Diagnosis not present

## 2011-12-15 DIAGNOSIS — M25519 Pain in unspecified shoulder: Secondary | ICD-10-CM | POA: Diagnosis not present

## 2011-12-22 DIAGNOSIS — M25519 Pain in unspecified shoulder: Secondary | ICD-10-CM | POA: Diagnosis not present

## 2011-12-22 DIAGNOSIS — S43429A Sprain of unspecified rotator cuff capsule, initial encounter: Secondary | ICD-10-CM | POA: Diagnosis not present

## 2011-12-27 DIAGNOSIS — H01009 Unspecified blepharitis unspecified eye, unspecified eyelid: Secondary | ICD-10-CM | POA: Diagnosis not present

## 2011-12-27 DIAGNOSIS — H259 Unspecified age-related cataract: Secondary | ICD-10-CM | POA: Diagnosis not present

## 2011-12-27 DIAGNOSIS — H52209 Unspecified astigmatism, unspecified eye: Secondary | ICD-10-CM | POA: Diagnosis not present

## 2011-12-27 DIAGNOSIS — E119 Type 2 diabetes mellitus without complications: Secondary | ICD-10-CM | POA: Diagnosis not present

## 2011-12-29 DIAGNOSIS — M24119 Other articular cartilage disorders, unspecified shoulder: Secondary | ICD-10-CM | POA: Diagnosis not present

## 2011-12-29 DIAGNOSIS — M75120 Complete rotator cuff tear or rupture of unspecified shoulder, not specified as traumatic: Secondary | ICD-10-CM | POA: Diagnosis not present

## 2012-02-29 ENCOUNTER — Other Ambulatory Visit: Payer: Self-pay | Admitting: Pulmonary Disease

## 2012-02-29 ENCOUNTER — Other Ambulatory Visit: Payer: Self-pay | Admitting: Dermatology

## 2012-02-29 DIAGNOSIS — L821 Other seborrheic keratosis: Secondary | ICD-10-CM | POA: Diagnosis not present

## 2012-02-29 DIAGNOSIS — T148 Other injury of unspecified body region: Secondary | ICD-10-CM | POA: Diagnosis not present

## 2012-02-29 DIAGNOSIS — D239 Other benign neoplasm of skin, unspecified: Secondary | ICD-10-CM | POA: Diagnosis not present

## 2012-02-29 DIAGNOSIS — L570 Actinic keratosis: Secondary | ICD-10-CM | POA: Diagnosis not present

## 2012-02-29 DIAGNOSIS — Z808 Family history of malignant neoplasm of other organs or systems: Secondary | ICD-10-CM | POA: Diagnosis not present

## 2012-02-29 DIAGNOSIS — D485 Neoplasm of uncertain behavior of skin: Secondary | ICD-10-CM | POA: Diagnosis not present

## 2012-02-29 DIAGNOSIS — W57XXXA Bitten or stung by nonvenomous insect and other nonvenomous arthropods, initial encounter: Secondary | ICD-10-CM | POA: Diagnosis not present

## 2012-03-27 ENCOUNTER — Encounter: Payer: Self-pay | Admitting: Internal Medicine

## 2012-05-25 ENCOUNTER — Other Ambulatory Visit: Payer: Self-pay | Admitting: Neurosurgery

## 2012-05-25 DIAGNOSIS — M545 Low back pain, unspecified: Secondary | ICD-10-CM

## 2012-05-25 DIAGNOSIS — M48061 Spinal stenosis, lumbar region without neurogenic claudication: Secondary | ICD-10-CM | POA: Diagnosis not present

## 2012-06-06 ENCOUNTER — Ambulatory Visit
Admission: RE | Admit: 2012-06-06 | Discharge: 2012-06-06 | Disposition: A | Discharge status: Home / Self Care | Payer: Medicare Other | Source: Ambulatory Visit | Attending: Neurosurgery | Admitting: Neurosurgery

## 2012-06-06 ENCOUNTER — Other Ambulatory Visit: Payer: Self-pay | Admitting: Neurosurgery

## 2012-06-06 DIAGNOSIS — M545 Low back pain, unspecified: Secondary | ICD-10-CM

## 2012-06-06 DIAGNOSIS — M5137 Other intervertebral disc degeneration, lumbosacral region: Secondary | ICD-10-CM | POA: Diagnosis not present

## 2012-06-06 DIAGNOSIS — M25559 Pain in unspecified hip: Secondary | ICD-10-CM | POA: Diagnosis not present

## 2012-06-06 DIAGNOSIS — M47817 Spondylosis without myelopathy or radiculopathy, lumbosacral region: Secondary | ICD-10-CM | POA: Diagnosis not present

## 2012-06-06 DIAGNOSIS — M5126 Other intervertebral disc displacement, lumbar region: Secondary | ICD-10-CM | POA: Diagnosis not present

## 2012-06-06 MED ORDER — GADOBENATE DIMEGLUMINE 529 MG/ML IV SOLN
15.0000 mL | Freq: Once | INTRAVENOUS | Status: AC | PRN
  Administered 2012-06-06: 15 mL via INTRAVENOUS

## 2012-06-12 ENCOUNTER — Ambulatory Visit: Payer: Medicare Other | Admitting: Pulmonary Disease

## 2012-06-14 ENCOUNTER — Other Ambulatory Visit (INDEPENDENT_AMBULATORY_CARE_PROVIDER_SITE_OTHER): Payer: Medicare Other

## 2012-06-14 ENCOUNTER — Other Ambulatory Visit: Payer: Self-pay | Admitting: Pulmonary Disease

## 2012-06-14 ENCOUNTER — Ambulatory Visit: Payer: Medicare Other | Admitting: Pulmonary Disease

## 2012-06-14 DIAGNOSIS — E119 Type 2 diabetes mellitus without complications: Secondary | ICD-10-CM

## 2012-06-14 DIAGNOSIS — R972 Elevated prostate specific antigen [PSA]: Secondary | ICD-10-CM

## 2012-06-14 DIAGNOSIS — Z87898 Personal history of other specified conditions: Secondary | ICD-10-CM

## 2012-06-14 LAB — BASIC METABOLIC PANEL
Chloride: 100 mEq/L (ref 96–112)
Creatinine, Ser: 1 mg/dL (ref 0.4–1.5)
Potassium: 4.3 mEq/L (ref 3.5–5.1)
Sodium: 137 mEq/L (ref 135–145)

## 2012-06-14 LAB — PSA: PSA: 0.66 ng/mL (ref 0.10–4.00)

## 2012-06-14 LAB — HEMOGLOBIN A1C: Hgb A1c MFr Bld: 6.7 % — ABNORMAL HIGH (ref 4.6–6.5)

## 2012-06-22 ENCOUNTER — Other Ambulatory Visit: Payer: Self-pay | Admitting: Neurosurgery

## 2012-06-22 DIAGNOSIS — M48061 Spinal stenosis, lumbar region without neurogenic claudication: Secondary | ICD-10-CM | POA: Diagnosis not present

## 2012-07-06 ENCOUNTER — Encounter: Payer: Self-pay | Admitting: Pulmonary Disease

## 2012-07-06 ENCOUNTER — Ambulatory Visit (INDEPENDENT_AMBULATORY_CARE_PROVIDER_SITE_OTHER): Payer: Medicare Other | Admitting: Pulmonary Disease

## 2012-07-06 VITALS — BP 118/68 | HR 67 | Temp 98.1°F | Ht 71.0 in | Wt 176.6 lb

## 2012-07-06 DIAGNOSIS — K589 Irritable bowel syndrome without diarrhea: Secondary | ICD-10-CM

## 2012-07-06 DIAGNOSIS — Z23 Encounter for immunization: Secondary | ICD-10-CM

## 2012-07-06 DIAGNOSIS — M545 Low back pain, unspecified: Secondary | ICD-10-CM

## 2012-07-06 DIAGNOSIS — E785 Hyperlipidemia, unspecified: Secondary | ICD-10-CM

## 2012-07-06 DIAGNOSIS — Z86010 Personal history of colonic polyps: Secondary | ICD-10-CM

## 2012-07-06 DIAGNOSIS — M159 Polyosteoarthritis, unspecified: Secondary | ICD-10-CM

## 2012-07-06 DIAGNOSIS — F17200 Nicotine dependence, unspecified, uncomplicated: Secondary | ICD-10-CM | POA: Diagnosis not present

## 2012-07-06 DIAGNOSIS — J449 Chronic obstructive pulmonary disease, unspecified: Secondary | ICD-10-CM | POA: Diagnosis not present

## 2012-07-06 DIAGNOSIS — Z87442 Personal history of urinary calculi: Secondary | ICD-10-CM

## 2012-07-06 DIAGNOSIS — K219 Gastro-esophageal reflux disease without esophagitis: Secondary | ICD-10-CM

## 2012-07-06 DIAGNOSIS — E119 Type 2 diabetes mellitus without complications: Secondary | ICD-10-CM

## 2012-07-06 MED ORDER — ACYCLOVIR 400 MG PO TABS: 400.0000 mg | ORAL_TABLET | Freq: Two times a day (BID) | ORAL | Status: DC

## 2012-07-06 MED ORDER — POTASSIUM CITRATE ER 10 MEQ (1080 MG) PO TBCR: 10.0000 meq | EXTENDED_RELEASE_TABLET | Freq: Every day | ORAL | Status: DC

## 2012-07-06 MED ORDER — METFORMIN HCL 500 MG PO TABS: ORAL_TABLET | ORAL | Status: DC

## 2012-07-06 MED ORDER — HYDROCHLOROTHIAZIDE 50 MG PO TABS: ORAL_TABLET | ORAL | Status: DC

## 2012-07-06 MED ORDER — GEMFIBROZIL 600 MG PO TABS: 600.0000 mg | ORAL_TABLET | Freq: Every day | ORAL | Status: DC

## 2012-07-06 MED ORDER — OMEPRAZOLE 40 MG PO CPDR: 40.0000 mg | DELAYED_RELEASE_CAPSULE | Freq: Every day | ORAL | Status: DC

## 2012-07-06 MED ORDER — SIMVASTATIN 20 MG PO TABS: 20.0000 mg | ORAL_TABLET | Freq: Every day | ORAL | Status: DC

## 2012-07-06 NOTE — Patient Instructions (Addendum)
Today we updated your med list in our EPIC system...    Continue your current medications the same...  We gave you the 2013 Flu vaccine today...  Good luck w/ the up-coming back surg...  Call for any problems or if we can be of service in any way.Marland KitchenMarland Kitchen

## 2012-07-08 ENCOUNTER — Encounter: Payer: Self-pay | Admitting: Pulmonary Disease

## 2012-07-08 DIAGNOSIS — M545 Low back pain, unspecified: Secondary | ICD-10-CM | POA: Insufficient documentation

## 2012-07-08 NOTE — Progress Notes (Signed)
Subjective:    Patient ID: [name], male    DOB: April 04, 1941, 71 y.o.   MRN: [id number]  HPI 71 y/o WM here for a follow up visit... he has multiple medical problems including COPD & he still smokes a few cigs daily;  HBP;  Hyperlipidemia;  DM;  HH w/ decr LES;  Divertics & colon polyps;  hx kidney stones/ BPH w/ TURP followed by DrEvans & DrKimbrough;  DJD w/ neck & back pain- s/p lumbar diskectomy by DrPool...  ~  December 11, 2010:  He notes that his "stomach problems" are "improved but not fixed" but he is off meds & encouraged to restart Omep40mg /d & f/u w/ DrGessner...  he also notes sl cough, yellow sputum but denies f/c/s, incr SOB/ CP/ etc (we discussed checking CXR- clear, NAD) & rx w/ ZPak)... he had ?migraine HA yest that resolved spont... finally relates prob w/ left shoulder x 6-8wks & rather than rx w/ NSAID which he should avoid, we will refer to DrSypher for eval...    Still smoking 1-3 cig/d & encouraged to quit completely;  BP controlled on diet/ exercise/ & HCTZ perscribed for his kidney stones;  FLP looks good on Simva20 + Lopid600;  DM= fair control on diet alone (he stopped his Metformin on his own) w/ A1c up to 7.3 off the med;  KidStones followed by drKimbrough who agreed w/ weaning the diuretic/ K+ therapy(on HCTZ50- 1/2 daily + one UrorcitK/d...  ~  June 09, 2011:  640mo ROV & he has mult complaints> notes persistent vague GI symptoms like several soft BMs every AM, intermittent crampy pain, sounds like IBS- saw DrGessner last yr (stool studies neg), notes reviewed, in view of on-going symptoms rec f/u w/ him...  Had CTAbd 11/11> NAD; EGD showed gastitis, duodenitis, neg HPylori & treated w/ PPI (Prilosec) but no better he says so he stopped it...    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  Rx written for shingles vaccine to fill at pharmacy clinic...  ~  December 13, 2011:  640mo ROV & he continues to do well overall> no new complaints or concerns... 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  Notes some stress w/ son- seizures (doing about the same), and daugh (chr lyme w/ stem cell tx in [place] last yr)...    He saw DrGessner for GI f/u 10/12> IBS & GERD w/ rec to stay on Omep40mg /d & try Align; he took it for awhile, no real change & stopped it but rec to restart any time his BMs are loose or irreg...    He had left shoulder surg 11/12 by DrSypher> arthroscopy, debridement, & rotator cuff repair; went thru PT after that & now doing very well, no pain & good ROM.Marland KitchenMarland Kitchen CXR 2/13 showed normal heart size & clear lungs, DJD in spine, NAD... LABS 2/13:  FLP- at  goals on Simva20+Lopid600 x HDL=39;  Chems- ok x BS=115 A1c=6.7;  CBC- wnl;  TSH=2.06;  PSA=1.94 Note> prev PSA values were 0.63 & 0.49 (?incr PSA velocity & rec recheck 9mo)...  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    He is stable on his HCT, Urocit-K, Metformin, Simva20 & Lopid...    We reviewed prob list, meds, xrays and labs> see below for updates >> OK Flu vaccine today. LABS 8/13 showed:  Chems- ok w/ BS=134, normal renal funct, A1c=6.7, & PSA=0.66... Preop labs, XRays, EKG to be done at the hospital...    Problem  List:   S/P ENT SURG by DrShoemaker 2000 for SNORING- ?UPPP w/ resolution of snoring problem...  COPD (ICD-496) - smoker w/ hx of intermittent bronchitic problems, no chronic dyspnea, cough, sputum, etc; not on regular meds and hasn't required antibiotics in quite some time... ~  PFT's 2000 showed FVC= 3.33 (74%), FEV1= 1.76 (45%), %1sec= 52, mid-flows= 34% pred. ~  baseline CXR w/ sl hyperinflation, clear, NAD.Marland Kitchen. ~  f/u CXR 2/11 shows no acute changes... ~  CXR 2/12 showed clear lungs, NAD.Marland Kitchen. ~  CXR 2/13 showed normal heart size & clear lungs, DJD in spine, NAD...  CIGARETTE SMOKER (ICD-305.1) - still smokes on occas but not regularly per pt- eg[place]... he was able to decr smoking w/ Chantix Rx... he knows that he needs to discontinue all smoking.  HYPERTENSION (ICD-401.9) - diet controlled, but he was placed on HCTZ (50mg - now taking 1/2 tab daily) by DrEvans for kidney stone prevention in about 2003, and he wants to stop it... asked to monitor BP at home w/ digital cuff...   ~  8/12: BP today = 118/80> denies HA, fatigue, visual changes, CP, palipit, dizziness, syncope, dyspnea, edema, etc...  ~  2/13: BP= 120/74 & he remains mostly asymptomatic... ~  9/13: BP= 118/68 & he denies CP, palpit, SOB, edema, etc...  HYPERLIPIDEMIA (ICD-272.4) - on SIMVASTATIN 20mg /d & LOPID 600mg /d... his TG level was [id number] range in 1995-6 w/ Gemfibrizol started then... ~  FLP 1/07 on Lopid showed TChol 207, TG 77, HDL 41, LDL 147... rec> Simva20 added. ~  FLP 11/07 showed TChol 141, TG 82, HDL 37, LDL 87 ~  FLP 7/08 showed TChol 157, TG 99, HDL 44, LDL 94... Stable on Simva20 + Lopid600. ~  FLP 5/10 showed TChol 133, TG 97, HDL 43, LDL 70 ~  FLP 2/11 showed TChol 139, TG 105, HDL 47, LDL 71... Stable on Simva20 + Lopid600. ~  FLP 8/12 showed TChol 145, TG 99, HDL 49, LDL 76 ~  FLP 2/13 showed TChol 139, TG 114, HDL 39, LDL 78... Stable on Simva20 + Lopid600  DIABETES MELLITUS (ICD-250.00)  - + FamHx w/ DM in father> pt diet controlled in past- but in 2007 he took Medrol Rx from DrPool for LBP and developed problems while on a mission trip to [place]... Metformin started then and currently taking METFORMIN 500mg  Qam... ~  labs 11/07 showed BS= 98, A1c= 7.1 ~  labs 7/08 showed BS= 135, A1c= 6.7.Marland KitchenMarland Kitchen Continue Metformin Rx. ~  labs 12/08 showed BS= 111, A1c= 6.9 ~  labs 5/10 showed BS= 107, A1c= 6.5.Marland KitchenMarland Kitchen Continue Metformin + diet. ~  labs 2/11 showed BS= 114 ~  Labs 8/12 showed BS= 131, A1c= 6.7.Marland KitchenMarland Kitchen Continue Metformin & diet. ~  Labs 2/13 showed BS= 115, A1c= 6.7 ~  3/13:  Neg ophthalmology eval by DrStoneburner- no retinopathy, mild cats, good vision...  HIATAL HERNIA (ICD-553.3) - he had an EGD in 1987 showing a patulous LES, GERD, antritis... ~  EGD 11/11 by DrGessner showed esophagitis, gastritis, duodenitis> neg HPylori, treated w/ Omeprazole 40mg /d...  DIVERTICULOSIS OF COLON (ICD-562.10) >> R/O IBS... COLONIC POLYPS (ICD-211.3)  HEMORRHOIDS (ICD-455.6) - there is a pos family hx of colon cancer in his mother... ~  colonoscopy 7/04 showed divertics, hems, no polyps... f/u planned 5 yrs. ~  f/u colon 7/10 DrGessner w/ 6 polyps removed- max 8mm- mult tubular adenomas removed & f/u planned 82yrs. ~  CTAbd 11/11 showed mild bibasilar atx, right hepatic cyst, NAD... ~  9/13:  He is due for f/u colonoscopy by DrGessner but want to wait til his recovery from the upcoming back surg...  RENAL CALCULUS, HX OF (ICD-V13.01) - on HCTZ 50mg - 1/2 daily and Urocit-K Bid per DrEvans... he wants to stop these meds but prefers to have Urology appt 1st to discuss this & we will set this up for him. ~  s/p right ureteroscopy 2002 by DrEvans for stone... ~  Doing well w/o stone recurrence...  BENIGN PROSTATIC HYPERTROPHY, HX OF (ICD-V13.8) - s/p TURP & removal of bladder stone in 2003 by DrEvans... 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 ~  11/12:  He had left shoulder arthroscopy w/ rotator cuff repair, subacromial decompression, & distal clavical resection by drSypher. Hx of NECK PAIN (ICD-723.1) - s/p CSpine surg x2 in 1986 & 1995... Hx of BACK PAIN, LUMBAR (ICD-724.2) - s/p Lumbar microdiscectomy 12/07 by DrPool... ~  9/13:  He is sched for more back surg by DrPool for 10/13...   Health Maintenance: ~  GI= DrGessner & he is due for f/u colonoscopy soon... ~  GU= prev DrEvans & we will refer to [facility] per his request... DRE neg, PSA = wnl... ~  Immunizations:  gets yearly Flu vaccine... Tetanus? probably before his [place] trip in 2006... PNEUMOVAX given 2/11;  He had Shingles vaccine in 2012.   Past Surgical History  Procedure Date  . Cervical spine surgery 1986, 1995  . Uvulopalatopharyngoplasty 2001    Dr Annalee Genta  . Cystoscopy 11/03    TURP and stone removal - Dr Logan Bores  . Dental implants 2007  . Lumbar microdiscectomy 12/07    L3-4 by Dr Jordan Likes  . Upper gastrointestinal endoscopy 09/07/2010    erosive gastritis, GERD  . Colonoscopy w/ biopsies and polypectomy 05/06/2009    adenomous polyp, diverticulosis, external hemorrhoids    Outpatient Encounter Prescriptions as of 07/06/2012  Medication Sig Dispense Refill  . acyclovir (ZOVIRAX) 400 MG tablet Take 1 tablet (400 mg total) by mouth 2 (two) times daily.  180 tablet  3  . aspirin 81 MG tablet Take 81 mg by mouth daily.        Marland Kitchen gemfibrozil (LOPID) 600 MG tablet Take 1 tablet (600 mg total) by mouth daily.  90 tablet  3  . hydrochlorothiazide (HYDRODIURIL) 50 MG tablet Take as directed  90 tablet  3  . metFORMIN (GLUCOPHAGE) 500 MG tablet Take 1/2 tablet by mouth twice a day  90  tablet  3  . omeprazole (PRILOSEC) 40 MG capsule Take 1 capsule (40 mg total) by mouth daily.  90 capsule  3  . potassium citrate (UROCIT-K) 10 MEQ (1080 MG) SR tablet Take 1  tablet (10 mEq total) by mouth daily.  90 tablet  3  . sildenafil (VIAGRA) 100 MG tablet Take 1 tablet (100 mg total) by mouth daily as needed.  10 tablet  3  . simvastatin (ZOCOR) 20 MG tablet Take 1 tablet (20 mg total) by mouth at bedtime.  90 tablet  3  . DISCONTD: acyclovir (ZOVIRAX) 400 MG tablet Take 1 tablet (400 mg total) by mouth 2 (two) times daily.  180 tablet  3  . DISCONTD: gemfibrozil (LOPID) 600 MG tablet Take 1 tablet (600 mg total) by mouth daily.  90 tablet  3  . DISCONTD: hydrochlorothiazide (HYDRODIURIL) 50 MG tablet Take as directed  90 tablet  3  . DISCONTD: metFORMIN (GLUCOPHAGE) 500 MG tablet Take 1/2 tablet by mouth twice a day  90 tablet  3  . DISCONTD: omeprazole (PRILOSEC) 40 MG capsule Take 1 capsule (40 mg total) by mouth daily.  90 capsule  3  . DISCONTD: potassium citrate (UROCIT-K) 10 MEQ (1080 MG) SR tablet Take 1 tablet (10 mEq total) by mouth daily.  90 tablet  3  . DISCONTD: simvastatin (ZOCOR) 20 MG tablet TAKE ONE TABLET BY MOUTH EVERY NIGHT AT BEDTIME  90 tablet  0    No Known Allergies   Current Medications, Allergies, Past Medical History, Past Surgical History, Family History, and Social History were reviewed in [employer] record.    Review of Systems         See HPI - all other systems neg except as noted...       The patient complains of abdominal discomfort.  The patient denies anorexia, fever, weight gain, vision loss, decreased hearing, hoarseness, chest pain, syncope, dyspnea on exertion, peripheral edema, prolonged cough, headaches, hemoptysis, melena, hematochezia, severe indigestion/heartburn, hematuria, incontinence, muscle weakness, suspicious skin lesions, transient blindness, difficulty walking, depression, unusual weight change, abnormal bleeding, enlarged lymph nodes, and angioedema.    Objective:   Physical Exam     WD, WN, 71 y/o WM in NAD... GENERAL:  Alert & oriented; pleasant & cooperative... HEENT:   [place]/AT, EOM-wnl, PERRLA, EACs-clear, TMs-wnl, NOSE-clear, THROAT-clear & wnl. NECK:  Supple w/ decrROM; no JVD; normal carotid impulses w/o bruits; no thyromegaly or nodules palpated; no lymphadenopathy. CHEST:  Clear to P & A; without wheezes/ rales/ or rhonchi heard... HEART:  Regular Rhythm; without murmurs/ rubs/ or gallops detected... ABDOMEN:  Soft & nontender; normal bowel sounds; no organomegaly or masses palpated... EXT: without deformities, mild arthritic changes; no varicose veins/ venous insuffic/ or edema. NEURO: CNs intact, no focal neuro deficits... DERM:  No lesions noted; no rash etc...  RADIOLOGY DATA:  Reviewed in the EPIC EMR & discussed w/ the patient...  LABORATORY DATA:  Reviewed in the EPIC EMR & discussed w/ the patient...    Assessment & Plan:    ?HSV1, recurrent>  He treats w/ Acyclovir 400mg - 5x per day for outbreaks & decr to Bid for suppression...  COPD, ?ex-smoker>  Reminded to quit completely; denies cough, phlegm, SOB, CP, etc; he is not inclined to take meds or do f/u breathing tests, etc...  HBP>  Never much of a problem; he had kidney stones & controlled on diet + the HCT 25mg /d w/ Urocit-K...  HYPERLIPID>  Labs look good on  Simva20 + Lopid600, continue same...  DM>  Labs stable w/ A1c= 6.7 on the Metform500/d + diet etc...  GI> Hx of HH, GERD, Divertics, IBS, etc>  He saw DrGessner w/ rec to continue PPI & Align; due for f/u colonoscopy...  Kidney Stones>  Known nephrolithiasis in kidneys, no colick attacks or ureteral stones recently...  DJD w/ neck & back pain intermittently> he uses OTC meds as needed; sched for more back surg per DrPool...   Patient's Medications  New Prescriptions   No medications on file  Previous Medications   ASPIRIN 81 MG TABLET    Take 81 mg by mouth daily.     SILDENAFIL (VIAGRA) 100 MG TABLET    Take 1 tablet (100 mg total) by mouth daily as needed.  Modified Medications   Modified Medication Previous Medication    ACYCLOVIR (ZOVIRAX) 400 MG TABLET acyclovir (ZOVIRAX) 400 MG tablet      Take 1 tablet (400 mg total) by mouth 2 (two) times daily.    Take 1 tablet (400 mg total) by mouth 2 (two) times daily.   GEMFIBROZIL (LOPID) 600 MG TABLET gemfibrozil (LOPID) 600 MG tablet      Take 1 tablet (600 mg total) by mouth daily.    Take 1 tablet (600 mg total) by mouth daily.   HYDROCHLOROTHIAZIDE (HYDRODIURIL) 50 MG TABLET hydrochlorothiazide (HYDRODIURIL) 50 MG tablet      Take as directed    Take as directed   METFORMIN (GLUCOPHAGE) 500 MG TABLET metFORMIN (GLUCOPHAGE) 500 MG tablet      Take 1/2 tablet by mouth twice a day    Take 1/2 tablet by mouth twice a day   OMEPRAZOLE (PRILOSEC) 40 MG CAPSULE omeprazole (PRILOSEC) 40 MG capsule      Take 1 capsule (40 mg total) by mouth daily.    Take 1 capsule (40 mg total) by mouth daily.   POTASSIUM CITRATE (UROCIT-K) 10 MEQ (1080 MG) SR TABLET potassium citrate (UROCIT-K) 10 MEQ (1080 MG) SR tablet      Take 1 tablet (10 mEq total) by mouth daily.    Take 1 tablet (10 mEq total) by mouth daily.   SIMVASTATIN (ZOCOR) 20 MG TABLET simvastatin (ZOCOR) 20 MG tablet      Take 1 tablet (20 mg total) by mouth at bedtime.    TAKE ONE TABLET BY MOUTH EVERY NIGHT AT BEDTIME  Discontinued Medications   No medications on file

## 2012-08-01 ENCOUNTER — Encounter (HOSPITAL_COMMUNITY): Payer: Self-pay

## 2012-08-07 NOTE — Pre-Procedure Instructions (Signed)
20 MANISH RUGGIERO  08/07/2012   Your procedure is scheduled on:  Monday August 14, 2012.  Report to [facility] at 0530 AM.  Call this number if you have problems the morning of surgery: [phone]   Remember:   Do not eat food or drink:After Midnight.    Take these medicines the morning of surgery with A SIP OF WATER: Acyclovir (Zovirax), Omeprazole (Prilosec)   Do not wear jewelry  Do not wear lotions or colognes.   Men may shave face and neck.  Do not bring valuables to the hospital.  Contacts, dentures or bridgework may not be worn into surgery.  Leave suitcase in the car. After surgery it may be brought to your room.  For patients admitted to the hospital, checkout time is 11:00 AM the day of discharge.   Patients discharged the day of surgery will not be allowed to drive home.  Name and phone number of your driver:   Special Instructions: Shower using CHG 2 nights before surgery and the night before surgery.  If you shower the day of surgery use CHG.  Use special wash - you have one bottle of CHG for all showers.  You should use approximately 1/3 of the bottle for each shower.   Please read over the following fact sheets that you were given: Pain Booklet, Coughing and Deep Breathing, Blood Transfusion Information, MRSA Information and Surgical Site Infection Prevention

## 2012-08-08 ENCOUNTER — Encounter (HOSPITAL_COMMUNITY): Payer: Self-pay

## 2012-08-08 ENCOUNTER — Encounter (HOSPITAL_COMMUNITY)
Admission: RE | Admit: 2012-08-08 | Discharge: 2012-08-08 | Disposition: A | Discharge status: Home / Self Care | Payer: Medicare Other | Source: Ambulatory Visit | Attending: Neurosurgery | Admitting: Neurosurgery

## 2012-08-08 DIAGNOSIS — I10 Essential (primary) hypertension: Secondary | ICD-10-CM | POA: Diagnosis not present

## 2012-08-08 DIAGNOSIS — J449 Chronic obstructive pulmonary disease, unspecified: Secondary | ICD-10-CM | POA: Diagnosis not present

## 2012-08-08 DIAGNOSIS — M4120 Other idiopathic scoliosis, site unspecified: Secondary | ICD-10-CM | POA: Diagnosis not present

## 2012-08-08 DIAGNOSIS — M48062 Spinal stenosis, lumbar region with neurogenic claudication: Secondary | ICD-10-CM | POA: Diagnosis not present

## 2012-08-08 HISTORY — DX: Bronchitis, not specified as acute or chronic: J40

## 2012-08-08 LAB — BASIC METABOLIC PANEL
Calcium: 9.7 mg/dL (ref 8.4–10.5)
GFR calc non Af Amer: 65 mL/min — ABNORMAL LOW (ref >90)
Glucose, Bld: 158 mg/dL — ABNORMAL HIGH (ref 70–99)
Sodium: 135 mEq/L (ref 135–145)

## 2012-08-08 LAB — TYPE AND SCREEN: Antibody Screen: NEGATIVE

## 2012-08-08 NOTE — Progress Notes (Signed)
Patient informed Nurse that he had a stress test several years ago at [facility]. Patient denied having a cardiac cath or sleep study.

## 2012-08-13 MED ORDER — CEFAZOLIN SODIUM-DEXTROSE 2-3 GM-% IV SOLR
2.0000 g | INTRAVENOUS | Status: AC
  Administered 2012-08-14: 2 g via INTRAVENOUS
  Filled 2012-08-13: qty 50

## 2012-08-13 MED ORDER — DEXAMETHASONE SODIUM PHOSPHATE 10 MG/ML IJ SOLN
10.0000 mg | INTRAMUSCULAR | Status: AC
  Administered 2012-08-14: 10 mg via INTRAVENOUS
  Filled 2012-08-13: qty 1

## 2012-08-14 ENCOUNTER — Encounter (HOSPITAL_COMMUNITY): Payer: Self-pay | Admitting: *Deleted

## 2012-08-14 ENCOUNTER — Ambulatory Visit (HOSPITAL_COMMUNITY): Payer: Medicare Other

## 2012-08-14 ENCOUNTER — Encounter (HOSPITAL_COMMUNITY): Payer: Self-pay

## 2012-08-14 ENCOUNTER — Encounter (HOSPITAL_COMMUNITY): Payer: Self-pay | Admitting: Neurosurgery

## 2012-08-14 ENCOUNTER — Encounter (HOSPITAL_COMMUNITY)
Admission: RE | Disposition: A | Discharge status: Home / Self Care | Payer: Self-pay | Source: Ambulatory Visit | Attending: Neurosurgery

## 2012-08-14 ENCOUNTER — Inpatient Hospital Stay (HOSPITAL_COMMUNITY)
Admission: RE | Admit: 2012-08-14 | Discharge: 2012-08-15 | DRG: 458 | Disposition: A | Discharge status: Home / Self Care | Payer: Medicare Other | Source: Ambulatory Visit | Attending: Neurosurgery | Admitting: Neurosurgery

## 2012-08-14 DIAGNOSIS — M4120 Other idiopathic scoliosis, site unspecified: Secondary | ICD-10-CM | POA: Diagnosis not present

## 2012-08-14 DIAGNOSIS — J4489 Other specified chronic obstructive pulmonary disease: Secondary | ICD-10-CM | POA: Diagnosis not present

## 2012-08-14 DIAGNOSIS — K589 Irritable bowel syndrome without diarrhea: Secondary | ICD-10-CM | POA: Diagnosis present

## 2012-08-14 DIAGNOSIS — Z79899 Other long term (current) drug therapy: Secondary | ICD-10-CM | POA: Diagnosis not present

## 2012-08-14 DIAGNOSIS — M961 Postlaminectomy syndrome, not elsewhere classified: Secondary | ICD-10-CM | POA: Diagnosis not present

## 2012-08-14 DIAGNOSIS — Z7982 Long term (current) use of aspirin: Secondary | ICD-10-CM | POA: Diagnosis not present

## 2012-08-14 DIAGNOSIS — K219 Gastro-esophageal reflux disease without esophagitis: Secondary | ICD-10-CM | POA: Diagnosis not present

## 2012-08-14 DIAGNOSIS — E785 Hyperlipidemia, unspecified: Secondary | ICD-10-CM | POA: Diagnosis present

## 2012-08-14 DIAGNOSIS — E119 Type 2 diabetes mellitus without complications: Secondary | ICD-10-CM | POA: Diagnosis present

## 2012-08-14 DIAGNOSIS — M519 Unspecified thoracic, thoracolumbar and lumbosacral intervertebral disc disorder: Secondary | ICD-10-CM | POA: Diagnosis not present

## 2012-08-14 DIAGNOSIS — M48061 Spinal stenosis, lumbar region without neurogenic claudication: Secondary | ICD-10-CM | POA: Diagnosis not present

## 2012-08-14 DIAGNOSIS — I10 Essential (primary) hypertension: Secondary | ICD-10-CM | POA: Diagnosis present

## 2012-08-14 DIAGNOSIS — M48062 Spinal stenosis, lumbar region with neurogenic claudication: Secondary | ICD-10-CM | POA: Diagnosis present

## 2012-08-14 DIAGNOSIS — J449 Chronic obstructive pulmonary disease, unspecified: Secondary | ICD-10-CM | POA: Diagnosis present

## 2012-08-14 DIAGNOSIS — Z833 Family history of diabetes mellitus: Secondary | ICD-10-CM

## 2012-08-14 DIAGNOSIS — M545 Low back pain, unspecified: Secondary | ICD-10-CM | POA: Diagnosis not present

## 2012-08-14 HISTORY — PX: LUMBAR PERCUTANEOUS PEDICLE SCREW 1 LEVEL: SHX5560

## 2012-08-14 HISTORY — PX: ANTERIOR LAT LUMBAR FUSION: SHX1168

## 2012-08-14 LAB — GLUCOSE, CAPILLARY
Glucose-Capillary: 166 mg/dL — ABNORMAL HIGH (ref 70–99)
Glucose-Capillary: 257 mg/dL — ABNORMAL HIGH (ref 70–99)

## 2012-08-14 SURGERY — ANTERIOR LATERAL LUMBAR FUSION 1 LEVEL
Anesthesia: General | Site: Back | Laterality: Left | Wound class: Clean

## 2012-08-14 MED ORDER — OXYCODONE HCL 5 MG PO TABS
5.0000 mg | ORAL_TABLET | Freq: Once | ORAL | Status: AC | PRN
  Administered 2012-08-14: 5 mg via ORAL

## 2012-08-14 MED ORDER — SODIUM CHLORIDE 0.9 % IJ SOLN
3.0000 mL | Freq: Two times a day (BID) | INTRAMUSCULAR | Status: DC
  Administered 2012-08-14 – 2012-08-15 (×3): 3 mL via INTRAVENOUS

## 2012-08-14 MED ORDER — SODIUM CHLORIDE 0.9 % IR SOLN
Status: DC | PRN
  Administered 2012-08-14: 09:00:00

## 2012-08-14 MED ORDER — ALUM & MAG HYDROXIDE-SIMETH 200-200-20 MG/5ML PO SUSP: 30.0000 mL | Freq: Four times a day (QID) | ORAL | Status: DC | PRN

## 2012-08-14 MED ORDER — PROMETHAZINE HCL 25 MG/ML IJ SOLN: 6.2500 mg | INTRAMUSCULAR | Status: DC | PRN

## 2012-08-14 MED ORDER — OXYCODONE-ACETAMINOPHEN 5-325 MG PO TABS
1.0000 | ORAL_TABLET | ORAL | Status: DC | PRN
  Administered 2012-08-14 – 2012-08-15 (×5): 2 via ORAL
  Filled 2012-08-14 (×5): qty 2

## 2012-08-14 MED ORDER — HYDROCHLOROTHIAZIDE 25 MG PO TABS
25.0000 mg | ORAL_TABLET | Freq: Every day | ORAL | Status: DC
  Administered 2012-08-14: 25 mg via ORAL
  Filled 2012-08-14 (×2): qty 1

## 2012-08-14 MED ORDER — HYDROMORPHONE HCL PF 1 MG/ML IJ SOLN
INTRAMUSCULAR | Status: AC
  Filled 2012-08-14: qty 1

## 2012-08-14 MED ORDER — OXYCODONE HCL 5 MG/5ML PO SOLN: 5.0000 mg | Freq: Once | ORAL | Status: AC | PRN

## 2012-08-14 MED ORDER — BUPIVACAINE HCL (PF) 0.25 % IJ SOLN
INTRAMUSCULAR | Status: DC | PRN
  Administered 2012-08-14: 30 mL

## 2012-08-14 MED ORDER — ONDANSETRON HCL 4 MG/2ML IJ SOLN
INTRAMUSCULAR | Status: DC | PRN
  Administered 2012-08-14: 4 mg via INTRAVENOUS

## 2012-08-14 MED ORDER — POLYETHYLENE GLYCOL 3350 17 G PO PACK
17.0000 g | PACK | Freq: Every day | ORAL | Status: DC | PRN
  Filled 2012-08-14: qty 1

## 2012-08-14 MED ORDER — MENTHOL 3 MG MT LOZG: 1.0000 | LOZENGE | OROMUCOSAL | Status: DC | PRN

## 2012-08-14 MED ORDER — PROPOFOL 10 MG/ML IV BOLUS
INTRAVENOUS | Status: DC | PRN
  Administered 2012-08-14: 120 mg via INTRAVENOUS

## 2012-08-14 MED ORDER — ONDANSETRON HCL 4 MG/2ML IJ SOLN: 4.0000 mg | INTRAMUSCULAR | Status: DC | PRN

## 2012-08-14 MED ORDER — POTASSIUM CITRATE ER 10 MEQ (1080 MG) PO TBCR
10.0000 meq | EXTENDED_RELEASE_TABLET | Freq: Every day | ORAL | Status: DC
  Administered 2012-08-14: 10 meq via ORAL
  Filled 2012-08-14 (×2): qty 1

## 2012-08-14 MED ORDER — FLEET ENEMA 7-19 GM/118ML RE ENEM
1.0000 | ENEMA | Freq: Once | RECTAL | Status: AC | PRN
  Filled 2012-08-14: qty 1

## 2012-08-14 MED ORDER — HYDROMORPHONE HCL PF 1 MG/ML IJ SOLN
0.5000 mg | INTRAMUSCULAR | Status: DC | PRN
  Administered 2012-08-14 – 2012-08-15 (×3): 1 mg via INTRAVENOUS
  Filled 2012-08-14 (×3): qty 1

## 2012-08-14 MED ORDER — PANTOPRAZOLE SODIUM 40 MG PO TBEC
40.0000 mg | DELAYED_RELEASE_TABLET | Freq: Every day | ORAL | Status: DC
  Filled 2012-08-14: qty 1

## 2012-08-14 MED ORDER — ZOLPIDEM TARTRATE 5 MG PO TABS: 5.0000 mg | ORAL_TABLET | Freq: Every evening | ORAL | Status: DC | PRN

## 2012-08-14 MED ORDER — HYDROMORPHONE HCL PF 1 MG/ML IJ SOLN
0.5000 mg | INTRAMUSCULAR | Status: DC | PRN
  Administered 2012-08-14 (×2): 0.5 mg via INTRAVENOUS

## 2012-08-14 MED ORDER — SIMVASTATIN 20 MG PO TABS
20.0000 mg | ORAL_TABLET | Freq: Every day | ORAL | Status: DC
  Filled 2012-08-14: qty 1

## 2012-08-14 MED ORDER — EPHEDRINE SULFATE 50 MG/ML IJ SOLN
INTRAMUSCULAR | Status: DC | PRN
  Administered 2012-08-14 (×2): 10 mg via INTRAVENOUS
  Administered 2012-08-14: 5 mg via INTRAVENOUS

## 2012-08-14 MED ORDER — BACITRACIN 50000 UNITS IM SOLR
INTRAMUSCULAR | Status: AC
  Filled 2012-08-14: qty 1

## 2012-08-14 MED ORDER — ATORVASTATIN CALCIUM 10 MG PO TABS
10.0000 mg | ORAL_TABLET | Freq: Every day | ORAL | Status: DC
  Filled 2012-08-14: qty 1

## 2012-08-14 MED ORDER — SODIUM CHLORIDE 0.9 % IV SOLN
INTRAVENOUS | Status: AC
  Filled 2012-08-14: qty 500

## 2012-08-14 MED ORDER — ACETAMINOPHEN 325 MG PO TABS: 650.0000 mg | ORAL_TABLET | ORAL | Status: DC | PRN

## 2012-08-14 MED ORDER — ACETAMINOPHEN 650 MG RE SUPP: 650.0000 mg | RECTAL | Status: DC | PRN

## 2012-08-14 MED ORDER — ROSUVASTATIN CALCIUM 5 MG PO TABS
5.0000 mg | ORAL_TABLET | Freq: Every day | ORAL | Status: DC
  Administered 2012-08-14: 5 mg via ORAL
  Filled 2012-08-14 (×2): qty 1

## 2012-08-14 MED ORDER — MIDAZOLAM HCL 2 MG/2ML IJ SOLN
0.5000 mg | Freq: Once | INTRAMUSCULAR | Status: AC | PRN
  Administered 2012-08-14: 0.5 mg via INTRAVENOUS

## 2012-08-14 MED ORDER — MIDAZOLAM HCL 5 MG/5ML IJ SOLN
INTRAMUSCULAR | Status: DC | PRN
  Administered 2012-08-14 (×2): 1 mg via INTRAVENOUS

## 2012-08-14 MED ORDER — LIDOCAINE HCL 4 % MT SOLN
OROMUCOSAL | Status: DC | PRN
  Administered 2012-08-14: 4 mL via TOPICAL

## 2012-08-14 MED ORDER — CEFAZOLIN SODIUM 1-5 GM-% IV SOLN
1.0000 g | Freq: Three times a day (TID) | INTRAVENOUS | Status: AC
  Administered 2012-08-14 (×2): 1 g via INTRAVENOUS
  Filled 2012-08-14 (×2): qty 50

## 2012-08-14 MED ORDER — ARTIFICIAL TEARS OP OINT
TOPICAL_OINTMENT | OPHTHALMIC | Status: DC | PRN
  Administered 2012-08-14: 1 via OPHTHALMIC

## 2012-08-14 MED ORDER — ACYCLOVIR 400 MG PO TABS
400.0000 mg | ORAL_TABLET | Freq: Two times a day (BID) | ORAL | Status: DC
  Administered 2012-08-14: 400 mg via ORAL
  Filled 2012-08-14 (×4): qty 1

## 2012-08-14 MED ORDER — LACTATED RINGERS IV SOLN
INTRAVENOUS | Status: DC | PRN
  Administered 2012-08-14 (×2): via INTRAVENOUS

## 2012-08-14 MED ORDER — SODIUM CHLORIDE 0.9 % IJ SOLN: 3.0000 mL | INTRAMUSCULAR | Status: DC | PRN

## 2012-08-14 MED ORDER — SENNA 8.6 MG PO TABS
1.0000 | ORAL_TABLET | Freq: Two times a day (BID) | ORAL | Status: DC
  Administered 2012-08-14: 8.6 mg via ORAL
  Filled 2012-08-14 (×4): qty 1

## 2012-08-14 MED ORDER — 0.9 % SODIUM CHLORIDE (POUR BTL) OPTIME
TOPICAL | Status: DC | PRN
  Administered 2012-08-14: 1000 mL

## 2012-08-14 MED ORDER — FENTANYL CITRATE 0.05 MG/ML IJ SOLN
INTRAMUSCULAR | Status: DC | PRN
  Administered 2012-08-14: 250 ug via INTRAVENOUS

## 2012-08-14 MED ORDER — CYCLOBENZAPRINE HCL 10 MG PO TABS
ORAL_TABLET | ORAL | Status: AC
  Filled 2012-08-14: qty 1

## 2012-08-14 MED ORDER — GEMFIBROZIL 600 MG PO TABS
600.0000 mg | ORAL_TABLET | Freq: Every day | ORAL | Status: DC
  Administered 2012-08-14: 600 mg via ORAL
  Filled 2012-08-14 (×2): qty 1

## 2012-08-14 MED ORDER — PHENYLEPHRINE HCL 10 MG/ML IJ SOLN
INTRAMUSCULAR | Status: DC | PRN
  Administered 2012-08-14 (×3): 40 ug via INTRAVENOUS
  Administered 2012-08-14 (×2): 80 ug via INTRAVENOUS
  Administered 2012-08-14 (×3): 40 ug via INTRAVENOUS

## 2012-08-14 MED ORDER — MIDAZOLAM HCL 2 MG/2ML IJ SOLN
INTRAMUSCULAR | Status: AC
  Administered 2012-08-14: 0.5 mg
  Filled 2012-08-14: qty 2

## 2012-08-14 MED ORDER — OXYCODONE HCL 5 MG PO TABS
ORAL_TABLET | ORAL | Status: AC
  Filled 2012-08-14: qty 1

## 2012-08-14 MED ORDER — MEPERIDINE HCL 25 MG/ML IJ SOLN: 6.2500 mg | INTRAMUSCULAR | Status: DC | PRN

## 2012-08-14 MED ORDER — PHENOL 1.4 % MT LIQD: 1.0000 | OROMUCOSAL | Status: DC | PRN

## 2012-08-14 MED ORDER — CYCLOBENZAPRINE HCL 10 MG PO TABS
10.0000 mg | ORAL_TABLET | Freq: Three times a day (TID) | ORAL | Status: DC | PRN
  Administered 2012-08-14 (×2): 10 mg via ORAL
  Filled 2012-08-14: qty 1

## 2012-08-14 MED ORDER — ASPIRIN EC 81 MG PO TBEC
81.0000 mg | DELAYED_RELEASE_TABLET | Freq: Every day | ORAL | Status: DC
  Administered 2012-08-14: 81 mg via ORAL
  Filled 2012-08-14 (×2): qty 1

## 2012-08-14 MED ORDER — BISACODYL 10 MG RE SUPP: 10.0000 mg | Freq: Every day | RECTAL | Status: DC | PRN

## 2012-08-14 MED ORDER — HYDROCODONE-ACETAMINOPHEN 5-325 MG PO TABS: 1.0000 | ORAL_TABLET | ORAL | Status: DC | PRN

## 2012-08-14 MED ORDER — LIDOCAINE HCL (CARDIAC) 20 MG/ML IV SOLN
INTRAVENOUS | Status: DC | PRN
  Administered 2012-08-14: 25 mg via INTRAVENOUS

## 2012-08-14 MED ORDER — HYDROMORPHONE HCL PF 1 MG/ML IJ SOLN
0.2500 mg | INTRAMUSCULAR | Status: DC | PRN
  Administered 2012-08-14 (×4): 0.5 mg via INTRAVENOUS

## 2012-08-14 MED ORDER — SODIUM CHLORIDE 0.9 % IV SOLN: 250.0000 mL | INTRAVENOUS | Status: DC

## 2012-08-14 SURGICAL SUPPLY — 73 items
ADH SKN CLS APL DERMABOND .7 (GAUZE/BANDAGES/DRESSINGS) ×2
ADH SKN CLS LQ APL DERMABOND (GAUZE/BANDAGES/DRESSINGS) ×1
APL SKNCLS STERI-STRIP NONHPOA (GAUZE/BANDAGES/DRESSINGS) ×1
BAG DECANTER FOR FLEXI CONT (MISCELLANEOUS) ×4 IMPLANT
BENZOIN TINCTURE PRP APPL 2/3 (GAUZE/BANDAGES/DRESSINGS) ×3 IMPLANT
BLADE SURG ROTATE 9660 (MISCELLANEOUS) ×1 IMPLANT
BONE MATRIX OSTEOCEL PLUS 5CC (Bone Implant) ×1 IMPLANT
BRUSH SCRUB EZ PLAIN DRY (MISCELLANEOUS) ×2 IMPLANT
CAGE COROENT 8X22X60 (Cage) ×1 IMPLANT
CLOTH BEACON ORANGE TIMEOUT ST (SAFETY) ×4 IMPLANT
CONT SPEC 4OZ CLIKSEAL STRL BL (MISCELLANEOUS) IMPLANT
COVER BACK TABLE 24X17X13 BIG (DRAPES) ×1 IMPLANT
COVER TABLE BACK 60X90 (DRAPES) ×2 IMPLANT
DERMABOND ADHESIVE PROPEN (GAUZE/BANDAGES/DRESSINGS) ×1
DERMABOND ADVANCED (GAUZE/BANDAGES/DRESSINGS) ×2
DERMABOND ADVANCED .7 DNX12 (GAUZE/BANDAGES/DRESSINGS) ×2 IMPLANT
DERMABOND ADVANCED .7 DNX6 (GAUZE/BANDAGES/DRESSINGS) IMPLANT
DRAPE C-ARM 42X72 X-RAY (DRAPES) ×4 IMPLANT
DRAPE C-ARMOR (DRAPES) ×4 IMPLANT
DRAPE LAPAROTOMY 100X72X124 (DRAPES) ×4 IMPLANT
DRAPE POUCH INSTRU U-SHP 10X18 (DRAPES) ×2 IMPLANT
DRAPE SURG 17X23 STRL (DRAPES) ×10 IMPLANT
ELECT BLADE 4.0 EZ CLEAN MEGAD (MISCELLANEOUS)
ELECT REM PT RETURN 9FT ADLT (ELECTROSURGICAL) ×4
ELECTRODE BLDE 4.0 EZ CLN MEGD (MISCELLANEOUS) IMPLANT
ELECTRODE REM PT RTRN 9FT ADLT (ELECTROSURGICAL) ×2 IMPLANT
EVACUATOR 1/8 PVC DRAIN (DRAIN) IMPLANT
GAUZE SPONGE 4X4 16PLY XRAY LF (GAUZE/BANDAGES/DRESSINGS) IMPLANT
GLOVE BIO SURGEON STRL SZ8 (GLOVE) ×1 IMPLANT
GLOVE BIOGEL PI IND STRL 7.0 (GLOVE) IMPLANT
GLOVE BIOGEL PI INDICATOR 7.0 (GLOVE) ×3
GLOVE ECLIPSE 8.5 STRL (GLOVE) ×5 IMPLANT
GLOVE EXAM NITRILE LRG STRL (GLOVE) IMPLANT
GLOVE EXAM NITRILE MD LF STRL (GLOVE) IMPLANT
GLOVE EXAM NITRILE XL STR (GLOVE) IMPLANT
GLOVE EXAM NITRILE XS STR PU (GLOVE) IMPLANT
GLOVE INDICATOR 8.5 STRL (GLOVE) ×1 IMPLANT
GLOVE SS BIOGEL STRL SZ 6.5 (GLOVE) IMPLANT
GLOVE SUPERSENSE BIOGEL SZ 6.5 (GLOVE) ×4
GOWN BRE IMP SLV AUR LG STRL (GOWN DISPOSABLE) ×1 IMPLANT
GOWN BRE IMP SLV AUR XL STRL (GOWN DISPOSABLE) ×6 IMPLANT
GOWN STRL REIN 2XL LVL4 (GOWN DISPOSABLE) IMPLANT
GUIDEWIRE NITINOL BEVEL TIP (WIRE) ×2 IMPLANT
KIT BASIN OR (CUSTOM PROCEDURE TRAY) ×4 IMPLANT
KIT DILATOR XLIF 5 (KITS) IMPLANT
KIT MAXCESS (KITS) ×1 IMPLANT
KIT NDL NVM5 EMG ELECT (KITS) IMPLANT
KIT NEEDLE NVM5 EMG ELECT (KITS) ×1 IMPLANT
KIT NEEDLE NVM5 EMG ELECTRODE (KITS) ×1
KIT ROOM TURNOVER OR (KITS) ×4 IMPLANT
KIT XLIF (KITS) ×1
NDL I-PASS III (NEEDLE) IMPLANT
NEEDLE HYPO 22GX1.5 SAFETY (NEEDLE) ×4 IMPLANT
NEEDLE I-PASS III (NEEDLE) ×2 IMPLANT
NS IRRIG 1000ML POUR BTL (IV SOLUTION) ×4 IMPLANT
PACK LAMINECTOMY NEURO (CUSTOM PROCEDURE TRAY) ×4 IMPLANT
PUTTY BONE DBX 5CC MIX (Putty) ×1 IMPLANT
SCREW PRECEPT 6.5X45 (Screw) ×2 IMPLANT
SCREW PRECEPT SET (Screw) ×2 IMPLANT
SPONGE GAUZE 4X4 12PLY (GAUZE/BANDAGES/DRESSINGS) ×2 IMPLANT
SPONGE LAP 4X18 X RAY DECT (DISPOSABLE) IMPLANT
SPONGE SURGIFOAM ABS GEL SZ50 (HEMOSTASIS) IMPLANT
STRIP CLOSURE SKIN 1/2X4 (GAUZE/BANDAGES/DRESSINGS) ×2 IMPLANT
SUT VIC AB 2-0 CT1 18 (SUTURE) ×6 IMPLANT
SUT VIC AB 3-0 SH 8-18 (SUTURE) ×6 IMPLANT
SYR 20ML ECCENTRIC (SYRINGE) ×4 IMPLANT
TAPE CLOTH 3X10 TAN LF (GAUZE/BANDAGES/DRESSINGS) ×5 IMPLANT
TAPE CLOTH SURG 4X10 WHT LF (GAUZE/BANDAGES/DRESSINGS) ×1 IMPLANT
TOWEL OR 17X24 6PK STRL BLUE (TOWEL DISPOSABLE) ×4 IMPLANT
TOWEL OR 17X26 10 PK STRL BLUE (TOWEL DISPOSABLE) ×4 IMPLANT
TRAY FOLEY CATH 14FRSI W/METER (CATHETERS) ×2 IMPLANT
WATER STERILE IRR 1000ML POUR (IV SOLUTION) ×4 IMPLANT
precept prebent rod 40 mm ×2 IMPLANT

## 2012-08-14 NOTE — Preoperative (Signed)
Beta Blockers   Reason not to administer Beta Blockers:Not Applicable, No BB therapy 

## 2012-08-14 NOTE — Addendum Note (Signed)
Addendum  created 08/14/12 1320 by Bishop Limbo, RN   Modules edited:Anesthesia Medication Administration

## 2012-08-14 NOTE — H&P (Signed)
[name] is an 71 y.o. male.   Chief Complaint: Back and left leg pain HPI: 71 year old male with remote history of laminotomy and microdiscectomy at L3-4 presents with worsening back and left lower extremity pain. Patient has failed all efforts at conservative management. Workup demonstrates evidence of marked disc space collapse with a Modic changes within the vertebral endplates at L3 and L4 with severe foraminal stenosis. Patient presents now for anterior lateral decompression and fusion.  Past Medical History  Diagnosis Date  . COPD (chronic obstructive pulmonary disease)   . Cigarette smoker   . Hyperlipidemia   . DM (diabetes mellitus)   . Hiatal hernia   . Diverticulosis of colon   . Colon polyp   . Hemorrhoids   . History of renal calculi   . History of BPH   . DJD (degenerative joint disease)   . Neck pain   . Lumbar back pain   . Gastritis   . GERD (gastroesophageal reflux disease)   . IBS (irritable bowel syndrome)   . Shortness of breath   . Chronic kidney disease     stone  . Bronchitis     hx of  . HTN (hypertension)     pt states med is for kidney stones and not high bp.     Past Surgical History  Procedure Date  . Cervical spine surgery 1986, 1995  . Uvulopalatopharyngoplasty 2001    Dr Annalee Genta  . Cystoscopy 11/03    TURP and stone removal - Dr Logan Bores  . Dental implants 2007  . Lumbar microdiscectomy 12/07    L3-4 by Dr Jordan Likes  . Upper gastrointestinal endoscopy 09/07/2010    erosive gastritis, GERD  . Colonoscopy w/ biopsies and polypectomy 05/06/2009    adenomous polyp, diverticulosis, external hemorrhoids  . Back surgery     x 3  . Rotator cuff repair     left shoulder  . Lithotripsy     x 3    Family History  Problem Relation Age of Onset  . Diabetes Father   . Colon cancer Mother     ? she had met dis and chemoRx   Social History:  reports that he has been smoking.  He has never used smokeless tobacco. He reports that he drinks  about 1.5 ounces of alcohol per week. He reports that he does not use illicit drugs.  Allergies: No Known Allergies  Medications Prior to Admission  Medication Sig Dispense Refill  . acyclovir (ZOVIRAX) 400 MG tablet Take 1 tablet (400 mg total) by mouth 2 (two) times daily.  180 tablet  3  . aspirin 81 MG tablet Take 81 mg by mouth daily.        Marland Kitchen gemfibrozil (LOPID) 600 MG tablet Take 1 tablet (600 mg total) by mouth daily.  90 tablet  3  . hydrochlorothiazide (HYDRODIURIL) 50 MG tablet Take 25 mg by mouth daily.      . metFORMIN (GLUCOPHAGE) 500 MG tablet Take 1/2 tablet by mouth twice a day  90 tablet  3  . omeprazole (PRILOSEC) 40 MG capsule Take 1 capsule (40 mg total) by mouth daily.  90 capsule  3  . potassium citrate (UROCIT-K) 10 MEQ (1080 MG) SR tablet Take 1 tablet (10 mEq total) by mouth daily.  90 tablet  3  . simvastatin (ZOCOR) 20 MG tablet Take 1 tablet (20 mg total) by mouth at bedtime.  90 tablet  3  . sildenafil (VIAGRA) 100 MG tablet Take  1 tablet (100 mg total) by mouth daily as needed.  10 tablet  3    Results for orders placed during the hospital encounter of 08/14/12 (from the past 48 hour(s))  GLUCOSE, CAPILLARY     Status: Abnormal   Collection Time   08/14/12  6:40 AM      Component Value Range Comment   Glucose-Capillary 127 (*) 70 - 99 mg/dL    No results found.  Review of Systems  Constitutional: Negative.   HENT: Negative.   Eyes: Negative.   Respiratory: Negative.   Cardiovascular: Negative.   Gastrointestinal: Negative.   Genitourinary: Negative.   Musculoskeletal: Negative.   Skin: Negative.   Neurological: Negative.   Endo/Heme/Allergies: Negative.   Psychiatric/Behavioral: Negative.     Blood pressure 123/78, pulse 60, temperature 98 F (36.7 C), temperature source Oral, resp. rate 18, SpO2 95.00%. Physical Exam  Constitutional: He is oriented to person, place, and time. He appears well-developed and well-nourished.  HENT:  Head:  Normocephalic and atraumatic.  Right Ear: External ear normal.  Left Ear: External ear normal.  Nose: Nose normal.  Mouth/Throat: Oropharynx is clear and moist.  Eyes: Conjunctivae normal and EOM are normal. Pupils are equal, round, and reactive to light.  Neck: Normal range of motion. Neck supple.  Cardiovascular: Normal rate, regular rhythm, normal heart sounds and intact distal pulses.   Respiratory: Effort normal and breath sounds normal.  GI: Soft. Bowel sounds are normal.  Musculoskeletal: He exhibits no edema and no tenderness.  Neurological: He is alert and oriented to person, place, and time. He has normal reflexes. He displays normal reflexes. No cranial nerve deficit. He exhibits normal muscle tone. Coordination normal.  Skin: Skin is warm and dry. No rash noted. No erythema. No pallor.  Psychiatric: He has a normal mood and affect. His behavior is normal. Judgment and thought content normal.     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 Plan left L3-4 anterior lateral interbody decompression and fusion with interbody cage and local allograft. Augmented with left L3 and L4 percutaneous pedicle screw fixation. Risks and benefits been explained. Patient wishes to proceed.  Deepak Bless A 08/14/2012, 7:45 AM

## 2012-08-14 NOTE — Brief Op Note (Signed)
08/14/2012  10:02 AM  PATIENT:  [name]  71 y.o. male  PRE-OPERATIVE DIAGNOSIS:  stenosis  POST-OPERATIVE DIAGNOSIS:  stenosis   PROCEDURE:  Procedure(s) (LRB) with comments: ANTERIOR LATERAL LUMBAR FUSION 1 LEVEL (Left) - Left lumbar three-four extreme lumbar interbody fusion with percutaneous pedicle screws  LUMBAR PERCUTANEOUS PEDICLE SCREW 1 LEVEL (Left) - Left lumbar three-four extreme lumbar interbody fusion with percutaneous pedicle screws   SURGEON:  Surgeon(s) and Role:    * Temple Pacini, MD - Primary    * Mariam Dollar, MD - Assisting  PHYSICIAN ASSISTANT:   ASSISTANTS:    ANESTHESIA:   general  EBL:  Total I/O In: 1000 [I.V.:1000] Out: 200 [Urine:100; Blood:100]  BLOOD ADMINISTERED:none  DRAINS: none   LOCAL MEDICATIONS USED:  MARCAINE     SPECIMEN:  No Specimen  DISPOSITION OF SPECIMEN:  N/A  COUNTS:  YES  TOURNIQUET:  * No tourniquets in log *  DICTATION: .Dragon Dictation  PLAN OF CARE: Admit to inpatient   PATIENT DISPOSITION:  PACU - hemodynamically stable.   Delay start of Pharmacological VTE agent (>24hrs) due to surgical blood loss or risk of bleeding: yes

## 2012-08-14 NOTE — Progress Notes (Signed)
Orthopedic Tech Progress Note Patient Details:  [name] 10-12-1941 [id number]  Patient ID: [name], male   DOB: 1941-07-12, 71 y.o.   MRN: [id number]   [name] 08/14/2012, 9:02 AM L-S SUPPORT COMPETED BY [employer]

## 2012-08-14 NOTE — Addendum Note (Signed)
Addendum  created 08/14/12 1320 by Haydn Cush S Anntionette Madkins, RN   Modules edited:Anesthesia Medication Administration    

## 2012-08-14 NOTE — Transfer of Care (Signed)
Immediate Anesthesia Transfer of Care Note  Patient: [name]  Procedure(s) Performed: Procedure(s) (LRB) with comments: ANTERIOR LATERAL LUMBAR FUSION 1 LEVEL (Left) - Left lumbar three-four extreme lumbar interbody fusion with percutaneous pedicle screws  LUMBAR PERCUTANEOUS PEDICLE SCREW 1 LEVEL (Left) - Left lumbar three-four extreme lumbar interbody fusion with percutaneous pedicle screws   Patient Location: PACU  Anesthesia Type:General  Level of Consciousness: awake, alert  and oriented  Airway & Oxygen Therapy: Patient Spontanous Breathing and Patient connected to face mask oxygen  Post-op Assessment: Report given to PACU RN and Post -op Vital signs reviewed and stable  Post vital signs: Reviewed and stable  Complications: No apparent anesthesia complications

## 2012-08-14 NOTE — Anesthesia Postprocedure Evaluation (Signed)
  Anesthesia Post-op Note  Patient: [name]  Procedure(s) Performed: Procedure(s) (LRB) with comments: ANTERIOR LATERAL LUMBAR FUSION 1 LEVEL (Left) - Left lumbar three-four extreme lumbar interbody fusion with percutaneous pedicle screws  LUMBAR PERCUTANEOUS PEDICLE SCREW 1 LEVEL (Left) - Left lumbar three-four extreme lumbar interbody fusion with percutaneous pedicle screws   Patient Location: PACU  Anesthesia Type: General   Level of Consciousness: sedated, patient cooperative and responds to stimulation and voice  Airway and Oxygen Therapy: Patient Spontanous Breathing and Patient connected to nasal cannula oxygen  Post-op Pain: mild  Post-op Assessment: Post-op Vital signs reviewed, Patient's Cardiovascular Status Stable, Respiratory Function Stable, Patent Airway, No signs of Nausea or vomiting and Pain level controlled  Post-op Vital Signs: Reviewed and stable  Complications: No apparent anesthesia complications and back pain

## 2012-08-14 NOTE — Anesthesia Preprocedure Evaluation (Signed)
Anesthesia Evaluation  Patient identified by MRN, date of birth, ID band Patient awake    Reviewed: Allergy & Precautions, H&P , NPO status , Patient's Chart, lab work & pertinent test results  History of Anesthesia Complications Negative for: history of anesthetic complications  Airway Mallampati: II TM Distance: >3 FB Neck ROM: Full    Dental  (+) Edentulous Upper, Poor Dentition, Dental Advisory Given, Chipped and Missing   Pulmonary COPDCurrent Smoker,  breath sounds clear to auscultation  Pulmonary exam normal       Cardiovascular hypertension, Pt. on medications Rhythm:Regular Rate:Normal     Neuro/Psych PSYCHIATRIC DISORDERS Anxiety Chronic back pain    GI/Hepatic Neg liver ROS, hiatal hernia, GERD-  Medicated and Controlled,  Endo/Other  diabetes (glu 118), Well Controlled, Type 2, Oral Hypoglycemic Agents  Renal/GU Renal diseasenegative Renal ROS     Musculoskeletal   Abdominal   Peds  Hematology negative hematology ROS (+)   Anesthesia Other Findings   Reproductive/Obstetrics                           Anesthesia Physical Anesthesia Plan  ASA: II  Anesthesia Plan: General   Post-op Pain Management:    Induction: Intravenous  Airway Management Planned: Oral ETT  Additional Equipment:   Intra-op Plan:   Post-operative Plan: Extubation in OR  Informed Consent: I have reviewed the patients History and Physical, chart, labs and discussed the procedure including the risks, benefits and alternatives for the proposed anesthesia with the patient or authorized representative who has indicated his/her understanding and acceptance.   Dental advisory given  Plan Discussed with: CRNA and Surgeon  Anesthesia Plan Comments: (Plan routine monitors, GETA)        Anesthesia Quick Evaluation

## 2012-08-14 NOTE — Plan of Care (Signed)
Problem: Consults Goal: Diagnosis - Spinal Surgery Outcome: Completed/Met Date Met:  08/14/12 Thoraco/Lumbar Spine Fusion

## 2012-08-14 NOTE — Op Note (Signed)
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  Asst. Surgeon: Wynetta Emery  Anesthesia: General  Indication: 71 year old male status post previous L3-4 laminotomy discectomy remotely in the past presents with worsening back and lower chamois pain failing all conservative measure workup demonstrates evidence of marked disc space collapse and a resultant degenerative scoliosis at the L3-4 level with marked foraminal stenosis and symptoms of a claudication. Patient's failed all efforts at conservative management and presents now for L3-4 anterior lateral fusion.  Operative note: After induction anesthesia patient positioned in the right lateral decubitus position and appropriately padded. The bed was flexed. Fluoroscopy was used and the planned incisions were marked. Neuro monitoring was used throughout the case. 2 incisions were made in the left flank. Using the more posterior incision access was made into the retroperitoneal space. Using the more anterior incision a dilator was then placed and docked into the lateral disc space in a transversalis retroperitoneal approach on the left side. Guidewire was placed into a large osteophyte. Plan incision was progressively dilated. Self-retaining retractor was inserted. The field and inspected and the L3 nerve root on the left side was identified. This was mobilized and pushed slightly inferiorly and the retractor was moved superiorly and anteriorly. A docking position was then made. It neuro monitoring was used with direct stimulation to assure the nerve  root was out of the way and a shim was placed into the lateral osteophyte of L3-4. Site was then removed using chisels. The space was ascertained. The spaces and entered using Cobb elevators. The space was progressively widened and distracted. And eventually a 8 x 22 mm implant trial was then impacted in place with good positioning on both AP and lateral fluoroscopy. The trial was removed. In place  Fusion. An 8 x 22 x 60 mm  anterior cage was packed with morselized allograft and osteo- cell plus. This is impacted in place under fluoroscopic guidance. Paddles were removed. After confirming placement on fluoroscopy the retractor system was removed. Hemostasis was found to be excellent. Preparation was then made for posterior percutaneous fixation. The bed was brought back to neutral position. I 2 separate stab incisions were made overlying the pedicles of L3 and L4 on the left side. Using a Jamshidi introducer the pedicles were then probed utilizing intraoperative fluoroscopy and neural monitoring. Guidewires were placed. Each pedicle was then tapped with a 5.5 mm screw tap hole once again using fluoroscopy and neural monitoring. 6.5 x 45 mm screws were placed at L3 and L4 on the left side. A 40 mm rod was then placed from the superior screw head to the inferior screw head. Locking caps were then engaged. They were given a final tightening under compression. Final images revealed good position the bone graft and hardware at the proper upper level with normal lamina spine. Wounds were irrigated with and bike solution and closed in layers with Vicryl sutures. Steri-Strips and sterile dressing were applied. There were no apparent complications. Patient tolerated the procedure well and returned to the recovery room postop.

## 2012-08-14 NOTE — Progress Notes (Signed)
UR COMPLETED  

## 2012-08-15 MED ORDER — OXYCODONE-ACETAMINOPHEN 5-325 MG PO TABS: 1.0000 | ORAL_TABLET | ORAL | Status: DC | PRN

## 2012-08-15 MED ORDER — DIAZEPAM 5 MG PO TABS: 5.0000 mg | ORAL_TABLET | Freq: Four times a day (QID) | ORAL | Status: DC | PRN

## 2012-08-15 MED ORDER — ROSUVASTATIN CALCIUM 5 MG PO TABS: 5.0000 mg | ORAL_TABLET | Freq: Every day | ORAL | Status: DC

## 2012-08-15 NOTE — Discharge Summary (Signed)
Physician Discharge Summary  Patient ID: [name] MRN: [id number] DOB/AGE: 69-Jun-1942 69 y.o.  Admit date: 08/14/2012 Discharge date: 08/15/2012  Admission Diagnoses:  Discharge Diagnoses:  Principal Problem:  *Lumbar stenosis with neurogenic claudication   Discharged Condition: good  Hospital Course: Patient admitted the hospital where he underwent uncomplicated lumbar decompression and fusion. Postoperatively is done well. Back and lower chamois symptoms improve. Strength station stable. Wound healing well. Rate for discharge home.  Consults:   Significant Diagnostic Studies:   Treatments:   Discharge Exam: Blood pressure 152/86, pulse 93, temperature 99.4 F (37.4 C), temperature source Oral, resp. rate 20, SpO2 93.00%. Awake and alert oriented and appropriate. Cranial nerve function is intact. Motor and sensory function extremities normal. Wound clean dry and intact. Chest and abdomen benign.  Disposition: 01-Home or Self Care  Discharge Orders    Future Appointments: Provider: Department: Dept Phone: Center:   12/19/2012 9:30 AM Michele Mcalpine, MD [facility] [phone] None       Medication List     As of 08/15/2012 10:50 AM    STOP taking these medications         simvastatin 20 MG tablet   Commonly known as: ZOCOR      TAKE these medications         acyclovir 400 MG tablet   Commonly known as: ZOVIRAX   Take 1 tablet (400 mg total) by mouth 2 (two) times daily.      aspirin 81 MG tablet   Take 81 mg by mouth daily.      diazepam 5 MG tablet   Commonly known as: VALIUM   Take 1 tablet (5 mg total) by mouth every 6 (six) hours as needed for anxiety.      gemfibrozil 600 MG tablet   Commonly known as: LOPID   Take 1 tablet (600 mg total) by mouth daily.      hydrochlorothiazide 50 MG tablet   Commonly known as: HYDRODIURIL   Take 25 mg by mouth daily.      metFORMIN 500 MG tablet   Commonly known as: GLUCOPHAGE   Take 1/2  tablet by mouth twice a day      omeprazole 40 MG capsule   Commonly known as: PRILOSEC   Take 1 capsule (40 mg total) by mouth daily.      oxyCODONE-acetaminophen 5-325 MG per tablet   Commonly known as: PERCOCET/ROXICET   Take 1-2 tablets by mouth every 4 (four) hours as needed.      potassium citrate 10 MEQ (1080 MG) SR tablet   Commonly known as: UROCIT-K   Take 1 tablet (10 mEq total) by mouth daily.      rosuvastatin 5 MG tablet   Commonly known as: CRESTOR   Take 1 tablet (5 mg total) by mouth daily at 6 PM.      sildenafil 100 MG tablet   Commonly known as: VIAGRA   Take 1 tablet (100 mg total) by mouth daily as needed.           Follow-up Information    Follow up with Kripa Foskey A, MD. Call in 1 week. (Ask for Lurena Joiner)    Contact information:   [address] [place] [address] [phone]          Signed: Emilyrose Darrah A 08/15/2012, 10:50 AM

## 2012-08-15 NOTE — Evaluation (Addendum)
Physical Therapy Evaluation Patient Details Name: [name] MRN: [id number] DOB: 1941/07/23 Today's Date: 08/15/2012 Time: 3086-5784 PT Time Calculation (min): 28 min  PT Assessment / Plan / Recommendation Clinical Impression  Admitted for anterior lateral fusion of L3-4; Pt. is not appropriate for PT at this time due to pt. functioning at baseline level. Pt. is able to safely perform bed mobility, transfers, and ambulation to return home with wife. Instructed on back percautions of bending, twisting and arching and pt. was able to state these percautions. Pt. also instructed on donning and doffing lumbar corset.    PT Assessment  Patent does not need any further PT services    Follow Up Recommendations  No PT follow up       Barriers to Discharge None  Equipment Recommendations  None recommended by PT    Recommendations for Other Services     Frequency      Precautions / Restrictions Precautions Precautions: Back Precaution Booklet Issued: Yes (comment) (Pt. given handout for back precautions) Precaution Comments: Pt. was able to state percautions of no bending, arching, and twisting. Required Braces or Orthoses: Spinal Brace Spinal Brace: Lumbar corset (Apply in sitting; Pt. able to properly don brace)   Pertinent Vitals/Pain 2/10 in L LE and 1/10 in back      Mobility  Bed Mobility Bed Mobility: Rolling Right;Right Sidelying to Sit;Sit to Sidelying Right Rolling Right: 6: Modified independent (Device/Increase time) Right Sidelying to Sit: 5: Supervision with cueing not to twist Sit to Sidelying Right: 6: Modified independent (Device/Increase time) Transfers Transfers: Sit to Stand;Stand to Sit Sit to Stand: 6: Modified independent (Device/Increase time) Stand to Sit: 6: Modified independent (Device/Increase time) Ambulation/Gait Ambulation/Gait Assistance: 6: Modified independent (Device/Increase time) Ambulation Distance (Feet): 200 Feet Gait Pattern:  Step-through pattern Gait velocity: decreased Stairs: Yes Stairs Assistance: 6: Modified independent (Device/Increase time) Stair Management Technique: One rail Right;Step to pattern;Forwards Number of Stairs: 3     Shoulder Instructions     Exercises     PT Diagnosis:    PT Problem List:   PT Treatment Interventions:     PT Goals    Visit Information  Last PT Received On: 08/15/12 Assistance Needed: +1    Subjective Data  Subjective: I'm ready for therapy Patient Stated Goal: Return home to wife and reading in bed   Prior Functioning  Home Living Lives With: Spouse;Family Available Help at Discharge: Family Type of Home: House Home Access: Stairs to enter Secretary/administrator of Steps: 2 Entrance Stairs-Rails: Right Home Layout: Two level Alternate Level Stairs-Number of Steps: Pt. will be living on first floor only Bathroom Shower/Tub: Walk-in shower;Door [employer] Toilet: Handicapped height Home Adaptive Equipment: Built-in shower seat;Grab bars around toilet;Grab bars in shower;Hand-held shower hose;Straight cane;Walker - rolling Prior Function Level of Independence: Independent Able to Take Stairs?: Yes Driving: Yes Communication Communication: No difficulties    Cognition  Overall Cognitive Status: Appears within functional limits for tasks assessed/performed Arousal/Alertness: Awake/alert Orientation Level: Appears intact for tasks assessed Behavior During Session: [facility] for tasks performed    Extremity/Trunk Assessment     Balance    End of Session PT - End of Session Equipment Utilized During Treatment: Back brace Activity Tolerance: Patient tolerated treatment well Patient left: in chair;with call bell/phone within reach Nurse Communication: Mobility status    Army Chaco SPT 08/15/2012, 8:20 AM  Seen and agree with SPT note Delaney Meigs, PT [phone]

## 2012-08-17 ENCOUNTER — Encounter (HOSPITAL_COMMUNITY): Payer: Self-pay | Admitting: Neurosurgery

## 2012-09-20 DIAGNOSIS — M4120 Other idiopathic scoliosis, site unspecified: Secondary | ICD-10-CM | POA: Diagnosis not present

## 2012-10-25 DIAGNOSIS — M4120 Other idiopathic scoliosis, site unspecified: Secondary | ICD-10-CM | POA: Diagnosis not present

## 2012-11-29 DIAGNOSIS — M4120 Other idiopathic scoliosis, site unspecified: Secondary | ICD-10-CM | POA: Diagnosis not present

## 2012-12-19 ENCOUNTER — Ambulatory Visit: Payer: Medicare Other | Admitting: Pulmonary Disease

## 2012-12-28 DIAGNOSIS — H35379 Puckering of macula, unspecified eye: Secondary | ICD-10-CM | POA: Diagnosis not present

## 2012-12-28 DIAGNOSIS — H01009 Unspecified blepharitis unspecified eye, unspecified eyelid: Secondary | ICD-10-CM | POA: Diagnosis not present

## 2012-12-28 DIAGNOSIS — E119 Type 2 diabetes mellitus without complications: Secondary | ICD-10-CM | POA: Diagnosis not present

## 2012-12-28 DIAGNOSIS — H259 Unspecified age-related cataract: Secondary | ICD-10-CM | POA: Diagnosis not present

## 2013-01-03 ENCOUNTER — Other Ambulatory Visit: Payer: Self-pay | Admitting: Pulmonary Disease

## 2013-01-10 ENCOUNTER — Other Ambulatory Visit: Payer: Self-pay | Admitting: Pulmonary Disease

## 2013-01-10 MED ORDER — GEMFIBROZIL 600 MG PO TABS: 600.0000 mg | ORAL_TABLET | Freq: Every day | ORAL | Status: DC

## 2013-01-19 ENCOUNTER — Other Ambulatory Visit: Payer: Self-pay | Admitting: Pulmonary Disease

## 2013-01-23 ENCOUNTER — Encounter: Payer: Self-pay | Admitting: Internal Medicine

## 2013-01-24 DIAGNOSIS — M4120 Other idiopathic scoliosis, site unspecified: Secondary | ICD-10-CM | POA: Diagnosis not present

## 2013-01-30 ENCOUNTER — Ambulatory Visit (INDEPENDENT_AMBULATORY_CARE_PROVIDER_SITE_OTHER): Payer: Medicare Other | Admitting: Pulmonary Disease

## 2013-01-30 ENCOUNTER — Other Ambulatory Visit (INDEPENDENT_AMBULATORY_CARE_PROVIDER_SITE_OTHER): Payer: Medicare Other

## 2013-01-30 ENCOUNTER — Encounter: Payer: Self-pay | Admitting: Pulmonary Disease

## 2013-01-30 VITALS — BP 116/68 | HR 74 | Temp 98.0°F | Ht 71.0 in | Wt 180.4 lb

## 2013-01-30 DIAGNOSIS — J449 Chronic obstructive pulmonary disease, unspecified: Secondary | ICD-10-CM | POA: Diagnosis not present

## 2013-01-30 DIAGNOSIS — E119 Type 2 diabetes mellitus without complications: Secondary | ICD-10-CM | POA: Diagnosis not present

## 2013-01-30 DIAGNOSIS — M545 Low back pain, unspecified: Secondary | ICD-10-CM

## 2013-01-30 DIAGNOSIS — Z87442 Personal history of urinary calculi: Secondary | ICD-10-CM

## 2013-01-30 DIAGNOSIS — E785 Hyperlipidemia, unspecified: Secondary | ICD-10-CM

## 2013-01-30 DIAGNOSIS — N320 Bladder-neck obstruction: Secondary | ICD-10-CM | POA: Diagnosis not present

## 2013-01-30 DIAGNOSIS — I10 Essential (primary) hypertension: Secondary | ICD-10-CM

## 2013-01-30 DIAGNOSIS — K589 Irritable bowel syndrome without diarrhea: Secondary | ICD-10-CM

## 2013-01-30 DIAGNOSIS — Z86010 Personal history of colonic polyps: Secondary | ICD-10-CM

## 2013-01-30 DIAGNOSIS — K219 Gastro-esophageal reflux disease without esophagitis: Secondary | ICD-10-CM

## 2013-01-30 DIAGNOSIS — M159 Polyosteoarthritis, unspecified: Secondary | ICD-10-CM

## 2013-01-30 DIAGNOSIS — F411 Generalized anxiety disorder: Secondary | ICD-10-CM | POA: Diagnosis not present

## 2013-01-30 LAB — CBC WITH DIFFERENTIAL/PLATELET
Basophils Absolute: 0 10*3/uL (ref 0.0–0.1)
Basophils Relative: 0.4 % (ref 0.0–3.0)
Eosinophils Relative: 3.1 % (ref 0.0–5.0)
HCT: 44.7 % (ref 39.0–52.0)
Hemoglobin: 15.2 g/dL (ref 13.0–17.0)
Lymphs Abs: 1.8 10*3/uL (ref 0.7–4.0)
Monocytes Relative: 6.8 % (ref 3.0–12.0)
Neutro Abs: 4.7 10*3/uL (ref 1.4–7.7)
RBC: 4.86 Mil/uL (ref 4.22–5.81)
RDW: 12.6 % (ref 11.5–14.6)

## 2013-01-30 LAB — LIPID PANEL
HDL: 33.6 mg/dL — ABNORMAL LOW (ref >39.00)
Total CHOL/HDL Ratio: 4
VLDL: 20 mg/dL (ref 0.0–40.0)

## 2013-01-30 LAB — BASIC METABOLIC PANEL
Calcium: 9.3 mg/dL (ref 8.4–10.5)
Creatinine, Ser: 0.9 mg/dL (ref 0.4–1.5)
GFR: 89.98 mL/min (ref >60.00)
Glucose, Bld: 115 mg/dL — ABNORMAL HIGH (ref 70–99)
Sodium: 136 mEq/L (ref 135–145)

## 2013-01-30 LAB — HEPATIC FUNCTION PANEL
Albumin: 4.4 g/dL (ref 3.5–5.2)
Alkaline Phosphatase: 88 U/L (ref 39–117)

## 2013-01-30 LAB — PSA: PSA: 0.59 ng/mL (ref 0.10–4.00)

## 2013-01-30 MED ORDER — SILDENAFIL CITRATE 100 MG PO TABS: 100.0000 mg | ORAL_TABLET | Freq: Every day | ORAL | Status: DC | PRN

## 2013-01-30 MED ORDER — HYDROCHLOROTHIAZIDE 50 MG PO TABS: 25.0000 mg | ORAL_TABLET | Freq: Every day | ORAL | Status: DC

## 2013-01-30 MED ORDER — GEMFIBROZIL 600 MG PO TABS: 600.0000 mg | ORAL_TABLET | Freq: Every day | ORAL | Status: DC

## 2013-01-30 MED ORDER — METFORMIN HCL 500 MG PO TABS: ORAL_TABLET | ORAL | Status: DC

## 2013-01-30 MED ORDER — OMEPRAZOLE 40 MG PO CPDR: 40.0000 mg | DELAYED_RELEASE_CAPSULE | Freq: Every day | ORAL | Status: DC

## 2013-01-30 MED ORDER — ACYCLOVIR 400 MG PO TABS: 400.0000 mg | ORAL_TABLET | Freq: Two times a day (BID) | ORAL | Status: DC

## 2013-01-30 MED ORDER — POTASSIUM CITRATE ER 10 MEQ (1080 MG) PO TBCR: 10.0000 meq | EXTENDED_RELEASE_TABLET | Freq: Every day | ORAL | Status: DC

## 2013-01-30 NOTE — Progress Notes (Signed)
Subjective:    Patient ID: [name], male    DOB: 22-Oct-1940, 72 y.o.   MRN: [id number]  HPI 72 y/o WM here for a follow up visit... he has multiple medical problems including COPD & he still smokes a few cigs daily;  HBP;  Hyperlipidemia;  DM;  HH w/ decr LES;  Divertics & colon polyps;  hx kidney stones/ BPH w/ TURP followed by DrEvans & DrKimbrough;  DJD w/ neck & back pain- s/p lumbar diskectomy by DrPool...  ~  June 09, 2011:  8mo ROV & he has mult complaints> notes persistent vague GI symptoms like several soft BMs every AM, intermittent crampy pain, sounds like IBS- saw DrGessner last yr (stool studies neg), notes reviewed, in view of on-going symptoms rec f/u w/ him...  Had CTAbd 11/11> NAD; EGD showed gastitis, duodenitis, neg HPylori & treated w/ PPI (Prilosec) but no better he says so he stopped it...    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  Rx written for shingles vaccine to fill at pharmacy clinic...  ~  December 13, 2011:  8mo ROV & he continues to do well overall> no new complaints or concerns... 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  Notes some stress w/ son- seizures (doing about the same), and daugh (chr lyme w/ stem cell tx in [place] last yr)...    He saw DrGessner for GI f/u 10/12> IBS & GERD w/ rec to stay on Omep40mg /d & try Align; he took it for awhile, no real change & stopped it but rec to restart any  time his BMs are loose or irreg...    He had left shoulder surg 11/12 by DrSypher> arthroscopy, debridement, & rotator cuff repair; went thru PT after that & now doing very well, no pain & good ROM.Marland KitchenMarland Kitchen CXR 2/13 showed normal heart size & clear lungs, DJD in spine, NAD... LABS 2/13:  FLP- at goals on Simva20+Lopid600 x HDL=39;  Chems- ok x BS=115 A1c=6.7;  CBC- wnl;  TSH=2.06;  PSA=1.94 Note> prev PSA values were 0.63 & 0.49 (?incr PSA velocity & rec recheck 8mo)...  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    He is stable on his HCT, Urocit-K, Metformin, Simva20 & Lopid...    We reviewed prob list, meds, xrays and labs> see below for updates >> OK Flu vaccine today. LABS 8/13 showed:  Chems- ok w/ BS=134, normal renal funct, A1c=6.7, & PSA=0.66... Preop labs, XRays, EKG to be done at the hospital...  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    COPD, smoker> still smoking but not regularly per pt; intermittent bronchitic exac but none recently; remote PFTs w/ mod obstruction- he has declined regular inhaler meds; CXRs have been clear...    HBP> on ASA81, HCT50-1/2, Urocit-K; BP= 116/68 & he denies CP, palpit, SOB, edema, etc...    Hyperlipid> on Simva20, Lopid600; FLP 4/14 shows TChol 127, TG 100, HDL 34, LDL 73; rec to incr exercise, same med + diet...    DM> on Metform500-1/2 Bid; labs 4/14 showed BS=115, A1c=7.2 & we reviewed diet/ exercise/ med rx- continue same for now...    GI- HH, Divertics, Polyps, Hems> on Prilosec20 prn & Align; he denies abd pain, dyspagia, n/v, c/d,  blood seen; notes some bloating/ gas- followed by [employer] & due for f/u colonoscopy (mother had colon ca)...    Hx kidney stones, BPH> stable on the Hct & Urocit-K w/o recurrent nephrolithiasis; mild LTOS but emptying satis...    DJD, Neck pain, LBP> s/p lumbar disc surg 2007 by DrPool and Lumbar fusion w/ pedicle screww 10/13- now improved by his report & off the Percocet...    Anxiety> he was prev on Valium prn but stopped this in the interval, doing satis... We reviewed prob list, meds, xrays and labs> see below for updates >>  Meds refilled today... LABS 4/14:  FLP- at goals on Simva20+Lopid600;  Chems- ok x BS=115 A1c=7.2 on Metform250Bid;  CBC- wnl;  TSH=1.71;  PSA=0.59...     Problem List:   S/P ENT SURG by DrShoemaker 2000 for SNORING- ?UPPP w/ resolution of snoring problem...  COPD (ICD-496) - smoker w/ hx of intermittent bronchitic problems, no chronic dyspnea, cough, sputum, etc; not on regular meds and hasn't required antibiotics in quite some time... ~  PFT's 2000 showed FVC= 3.33 (74%), FEV1= 1.76 (45%), %1sec= 52, mid-flows= 34% pred. ~  baseline CXR w/ sl hyperinflation, clear, NAD.Marland Kitchen. ~  f/u CXR 2/11 shows no acute changes... ~  CXR 2/12 showed clear lungs, NAD.Marland Kitchen. ~  CXR 2/13 showed normal heart size & clear lungs, DJD in spine, NAD...  CIGARETTE SMOKER (ICD-305.1) - still smokes on occas but not regularly per pt- eg[place] the [place]... he was able to decr smoking w/ Chantix Rx... he knows that he needs to discontinue all smoking.  HYPERTENSION (ICD-401.9) - diet controlled, but he was placed on HCTZ (50mg - now taking 1/2 tab daily) by DrEvans for kidney stone prevention in about 2003, and he wants to stop it... asked to monitor BP at home w/ digital cuff...   ~  8/12: BP today = 118/80> denies HA, fatigue, visual changes, CP, palipit, dizziness, syncope, dyspnea, edema, etc...  ~  2/13: BP= 120/74 & he remains mostly asymptomatic... ~  9/13: BP= 118/68 & he denies CP,  palpit, SOB, edema, etc... ~  4/14: on ASA81, HCT50-1/2, Urocit-K; BP= 116/68 & he denies CP, palpit, SOB, edema, etc.  HYPERLIPIDEMIA (ICD-272.4) - on SIMVASTATIN 20mg /d & LOPID 600mg /d... his TG level was [phone] range in 1995-6 w/ Gemfibrizol started then... ~  FLP 1/07 on Lopid showed TChol 207, TG 77, HDL 41, LDL 147... rec> Simva20 added. ~  FLP 11/07 showed TChol 141, TG 82, HDL 37, LDL 87 ~  FLP 7/08 showed TChol 157, TG 99, HDL 44, LDL 94... Stable on Simva20 + Lopid600. ~  FLP 5/10  showed TChol 133, TG 97, HDL 43, LDL 70 ~  FLP 2/11 showed TChol 139, TG 105, HDL 47, LDL 71... Stable on Simva20 + Lopid600. ~  FLP 8/12 showed TChol 145, TG 99, HDL 49, LDL 76 ~  FLP 2/13 showed TChol 139, TG 114, HDL 39, LDL 78... Stable on Simva20 + Lopid600 ~  4/14:  on Simva20, Lopid600; FLP 4/14 shows TChol 127, TG 100, HDL 34, LDL 73; rec to incr exercise, same med + diet.  DIABETES MELLITUS (ICD-250.00) - + FamHx w/ DM in father> pt diet controlled in past- but in 2007 he took Medrol Rx from [employer] for LBP and developed problems while on a mission trip to [place]... Metformin started then and adjusted>> ~  labs 11/07 showed BS= 98, A1c= 7.1 ~  labs 7/08 showed BS= 135, A1c= 6.7.Marland KitchenMarland Kitchen Continue Metformin Rx. ~  labs 12/08 showed BS= 111, A1c= 6.9 ~  labs 5/10 showed BS= 107, A1c= 6.5.Marland KitchenMarland Kitchen Continue Metformin + diet. ~  labs 2/11 showed BS= 114 ~  Labs 8/12 showed BS= 131, A1c= 6.7.Marland KitchenMarland Kitchen Continue Metformin & diet. ~  Labs 2/13 showed BS= 115, A1c= 6.7 ~  3/13:  Neg ophthalmology eval by DrStoneburner- no retinopathy, mild cats, good vision... ~  4/14:  on Metform500-1/2 Bid; labs 4/14 showed BS=115, A1c=7.2 & we reviewed diet/ exercise/ med rx- continue same for now.  HIATAL HERNIA (ICD-553.3) - he had an EGD in 1987 showing a patulous LES, GERD, antritis... ~  EGD 11/11 by DrGessner showed esophagitis, gastritis, duodenitis> neg HPylori, treated w/ Omeprazole 40mg /d...  DIVERTICULOSIS OF COLON  (ICD-562.10) >> R/O IBS... COLONIC POLYPS (ICD-211.3)  HEMORRHOIDS (ICD-455.6) - there is a pos family hx of colon cancer in his mother... ~  colonoscopy 7/04 showed divertics, hems, no polyps... f/u planned 5 yrs. ~  f/u colon 7/10 DrGessner w/ 6 polyps removed- max 8mm- mult tubular adenomas removed & f/u planned 54yrs. ~  CTAbd 11/11 showed mild bibasilar atx, right hepatic cyst, NAD... ~  9/13:  He is due for f/u colonoscopy by DrGessner but want to wait til his recovery from the upcoming back surg... ~  6/14:  sched for f/u colonoscopy by DrGessner...  RENAL CALCULUS, HX OF (ICD-V13.01) - on HCTZ 50mg - 1/2 daily and Urocit-K Bid per DrEvans... he wants to stop these meds but prefers to have Urology appt 1st to discuss this & we will set this up for him. ~  s/p right ureteroscopy 2002 by DrEvans for stone... ~  Doing well w/o stone recurrence...  BENIGN PROSTATIC HYPERTROPHY, HX OF (ICD-V13.8) - s/p TURP & removal of bladder stone in 2003 by DrEvans... 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 ~  11/12:  He had left shoulder arthroscopy w/ rotator cuff repair, subacromial decompression, & distal clavical resection by drSypher. Hx of NECK PAIN (ICD-723.1) - s/p CSpine surg x2 in 1986 & 1995... Hx of BACK PAIN, LUMBAR (ICD-724.2) - s/p Lumbar microdiscectomy 12/07 by DrPool... ~  9/13:  He is sched for more back surg by DrPool for 10/13 => Lumbar fusion & pedicle screw...  Health Maintenance: ~  GI= DrGessner & he is due for f/u colonoscopy soon... ~  GU= prev DrEvans & we will refer to [facility] per his request... DRE neg, PSA = wnl... ~  Immunizations:  gets yearly Flu vaccine... Tetanus? probably before his [place] trip in 2006... PNEUMOVAX given 2/11;  He had Shingles vaccine in 2012.   Past Surgical History   Procedure Laterality Date  . Cervical spine surgery  1986, 1995  . Uvulopalatopharyngoplasty  2001    Dr Annalee Genta  . Cystoscopy  11/03    TURP and stone removal - Dr Logan Bores  . Dental implants  2007  . Lumbar microdiscectomy  12/07    L3-4 by Dr Jordan Likes  . Upper gastrointestinal endoscopy  09/07/2010    erosive gastritis, GERD  . Colonoscopy w/ biopsies and polypectomy  05/06/2009    adenomous polyp, diverticulosis, external hemorrhoids  . Back surgery      x 3  . Rotator cuff repair      left shoulder  . Lithotripsy      x 3  . Anterior lat lumbar fusion  08/14/2012    Procedure: ANTERIOR LATERAL LUMBAR FUSION 1 LEVEL;  Surgeon: Temple Pacini, MD;  Location: [facility];  Service: Neurosurgery;  Laterality: Left;  Left lumbar three-four extreme lumbar interbody fusion with percutaneous pedicle screws   . Lumbar percutaneous pedicle screw 1 level  08/14/2012    Procedure: LUMBAR PERCUTANEOUS PEDICLE SCREW 1 LEVEL;  Surgeon: Temple Pacini, MD;  Location: [facility];  Service: Neurosurgery;  Laterality: Left;  Left lumbar three-four extreme lumbar interbody fusion with percutaneous pedicle screws     Outpatient Encounter Prescriptions as of 01/30/2013  Medication Sig Dispense Refill  . acyclovir (ZOVIRAX) 400 MG tablet Take 1 tablet (400 mg total) by mouth 2 (two) times daily.  180 tablet  3  . aspirin 81 MG tablet Take 81 mg by mouth daily.        Marland Kitchen gemfibrozil (LOPID) 600 MG tablet Take 1 tablet (600 mg total) by mouth daily.  90 tablet  1  . hydrochlorothiazide (HYDRODIURIL) 50 MG tablet Take 25 mg by mouth daily.      . metFORMIN (GLUCOPHAGE) 500 MG tablet Take 1/2 tablet by mouth twice a day  90 tablet  3  . omeprazole (PRILOSEC) 40 MG capsule TAKE ONE CAPSULE BY MOUTH DAILY  90 capsule  0  . potassium citrate (UROCIT-K) 10 MEQ (1080 MG) SR tablet TAKE 1 TABLET BY MOUTH EVERY DAY  90 tablet  0  . sildenafil (VIAGRA) 100 MG tablet Take 1 tablet (100 mg total) by mouth daily as  needed.  10 tablet  3  . [DISCONTINUED] diazepam (VALIUM) 5 MG tablet Take 1 tablet (5 mg total) by mouth every 6 (six) hours as needed for anxiety.  30 tablet  0  . [DISCONTINUED] oxyCODONE-acetaminophen (PERCOCET/ROXICET) 5-325 MG per tablet Take 1-2 tablets by mouth every 4 (four) hours as needed.  80 tablet  0  . [DISCONTINUED] rosuvastatin (CRESTOR) 5 MG tablet Take 1 tablet (5 mg total) by mouth daily at 6 PM.  30 tablet  1        He stopped the Crestor5 but is back on the SIMVASTATIN 20...    No Known Allergies   Current Medications, Allergies, Past Medical History, Past Surgical History, Family History, and Social History were reviewed in [employer] record.    Review of Systems         See HPI - all other systems neg except as noted...       The patient complains of abdominal discomfort.  The patient denies anorexia, fever, weight gain, vision loss, decreased  hearing, hoarseness, chest pain, syncope, dyspnea on exertion, peripheral edema, prolonged cough, headaches, hemoptysis, melena, hematochezia, severe indigestion/heartburn, hematuria, incontinence, muscle weakness, suspicious skin lesions, transient blindness, difficulty walking, depression, unusual weight change, abnormal bleeding, enlarged lymph nodes, and angioedema.    Objective:   Physical Exam     WD, WN, 72 y/o WM in NAD... GENERAL:  Alert & oriented; pleasant & cooperative... HEENT:  [place]/AT, EOM-wnl, PERRLA, EACs-clear, TMs-wnl, NOSE-clear, THROAT-clear & wnl. NECK:  Supple w/ decrROM; no JVD; normal carotid impulses w/o bruits; no thyromegaly or nodules palpated; no lymphadenopathy. CHEST:  Clear to P & A; without wheezes/ rales/ or rhonchi heard... HEART:  Regular Rhythm; without murmurs/ rubs/ or gallops detected... ABDOMEN:  Soft & nontender; normal bowel sounds; no organomegaly or masses palpated... EXT: without deformities, mild arthritic changes; no varicose veins/ venous insuffic/ or  edema. NEURO: CNs intact, no focal neuro deficits... DERM:  No lesions noted; no rash etc...  RADIOLOGY DATA:  Reviewed in the EPIC EMR & discussed w/ the patient...  LABORATORY DATA:  Reviewed in the EPIC EMR & discussed w/ the patient...    Assessment & Plan:    ?HSV1, recurrent>  He treats w/ Acyclovir 400mg - 5x per day for outbreaks & decr to Bid for suppression...  COPD, ?ex-smoker>  Reminded to quit completely; denies cough, phlegm, SOB, CP, etc; he is not inclined to take meds or do f/u breathing tests, etc...  HBP>  Never much of a problem; he had kidney stones & controlled on diet + the HCT 25mg /d w/ Urocit-K...  HYPERLIPID>  Labs look good on Simva20 + Lopid600, continue same...  DM>  Labs stable w/ A1c= 7.2 on the Metform1/2Bid + diet etc...  GI> Hx of HH, GERD, Divertics, IBS, etc>  He saw DrGessner w/ rec to continue PPI & Align; due for f/u colonoscopy...  Kidney Stones>  Known nephrolithiasis in kidneys, no colick attacks or ureteral stones recently...  DJD w/ neck & back pain intermittently> he uses OTC meds as needed; s/p back surg per DrPool...   Patient's Medications  New Prescriptions             SIMVASTATIN 20mg             Take one tab Qhs...   NA SULFATE-K SULFATE-MG SULF SOLN    Take 1 kit by mouth once. suprep as directed. No substitutions.  Previous Medications   ASPIRIN 81 MG TABLET    Take 81 mg by mouth daily.     HYDROCODONE-ACETAMINOPHEN (NORCO/VICODIN) 5-325 MG PER TABLET       SIMVASTATIN (ZOCOR) 20 MG TABLET    Take 20 mg by mouth every evening.  Modified Medications   Modified Medication Previous Medication   ACYCLOVIR (ZOVIRAX) 400 MG TABLET acyclovir (ZOVIRAX) 400 MG tablet      Take 1 tablet (400 mg total) by mouth 2 (two) times daily.    Take 1 tablet (400 mg total) by mouth 2 (two) times daily.   GEMFIBROZIL (LOPID) 600 MG TABLET gemfibrozil (LOPID) 600 MG tablet      Take 1 tablet (600 mg total) by mouth daily.    Take 1 tablet (600 mg  total) by mouth daily.   HYDROCHLOROTHIAZIDE (HYDRODIURIL) 50 MG TABLET hydrochlorothiazide (HYDRODIURIL) 50 MG tablet      Take 0.5 tablets (25 mg total) by mouth daily.    Take 25 mg by mouth daily.   METFORMIN (GLUCOPHAGE) 500 MG TABLET metFORMIN (GLUCOPHAGE) 500 MG tablet  Take 1/2 tablet by mouth twice a day    Take 1/2 tablet by mouth twice a day   OMEPRAZOLE (PRILOSEC) 40 MG CAPSULE omeprazole (PRILOSEC) 40 MG capsule      Take 1 capsule (40 mg total) by mouth daily.    TAKE ONE CAPSULE BY MOUTH DAILY   POTASSIUM CITRATE (UROCIT-K) 10 MEQ (1080 MG) SR TABLET potassium citrate (UROCIT-K) 10 MEQ (1080 MG) SR tablet      Take 1 tablet (10 mEq total) by mouth daily.    TAKE 1 TABLET BY MOUTH EVERY DAY   SILDENAFIL (VIAGRA) 100 MG TABLET sildenafil (VIAGRA) 100 MG tablet      Take 1 tablet (100 mg total) by mouth daily as needed.    Take 1 tablet (100 mg total) by mouth daily as needed.  Discontinued Medications   DIAZEPAM (VALIUM) 5 MG TABLET    Take 1 tablet (5 mg total) by mouth every 6 (six) hours as needed for anxiety.   OXYCODONE-ACETAMINOPHEN (PERCOCET/ROXICET) 5-325 MG PER TABLET    Take 1-2 tablets by mouth every 4 (four) hours as needed.   ROSUVASTATIN (CRESTOR) 5 MG TABLET    Take 1 tablet (5 mg total) by mouth daily at 6 PM.

## 2013-01-30 NOTE — Patient Instructions (Addendum)
Today we updated your med list in our EPIC system...    Continue your current medications the same...  Today we did your follow up FASTING blood work...    We will contact you w/ the results when available...   We will call drGessner's office regarding your follow up colonoscopy...  Call for any questions...  Let's plan a follow up visit in 25mo, sooner if needed for problems.Marland KitchenMarland Kitchen

## 2013-02-16 DIAGNOSIS — M545 Low back pain, unspecified: Secondary | ICD-10-CM | POA: Diagnosis not present

## 2013-02-16 DIAGNOSIS — M47817 Spondylosis without myelopathy or radiculopathy, lumbosacral region: Secondary | ICD-10-CM | POA: Diagnosis not present

## 2013-03-05 ENCOUNTER — Ambulatory Visit (AMBULATORY_SURGERY_CENTER): Payer: Medicare Other | Admitting: *Deleted

## 2013-03-05 VITALS — Ht 71.75 in | Wt 178.0 lb

## 2013-03-05 DIAGNOSIS — Z1211 Encounter for screening for malignant neoplasm of colon: Secondary | ICD-10-CM

## 2013-03-05 DIAGNOSIS — Z800 Family history of malignant neoplasm of digestive organs: Secondary | ICD-10-CM

## 2013-03-05 DIAGNOSIS — Z86010 Personal history of colonic polyps: Secondary | ICD-10-CM

## 2013-03-05 MED ORDER — NA SULFATE-K SULFATE-MG SULF 17.5-3.13-1.6 GM/177ML PO SOLN: 1.0000 | Freq: Once | ORAL | Status: DC

## 2013-03-05 NOTE — Progress Notes (Signed)
Denies complications with anesthesia or sedation. Denies allergies to eggs or soy products. 

## 2013-03-21 ENCOUNTER — Encounter: Payer: Self-pay | Admitting: Internal Medicine

## 2013-03-21 ENCOUNTER — Ambulatory Visit (AMBULATORY_SURGERY_CENTER): Payer: Medicare Other | Admitting: Internal Medicine

## 2013-03-21 ENCOUNTER — Other Ambulatory Visit: Payer: Self-pay | Admitting: Internal Medicine

## 2013-03-21 VITALS — BP 116/63 | HR 58 | Temp 97.6°F | Resp 11 | Ht 71.0 in | Wt 178.0 lb

## 2013-03-21 DIAGNOSIS — Z800 Family history of malignant neoplasm of digestive organs: Secondary | ICD-10-CM

## 2013-03-21 DIAGNOSIS — Z86010 Personal history of colonic polyps: Secondary | ICD-10-CM

## 2013-03-21 DIAGNOSIS — Z1211 Encounter for screening for malignant neoplasm of colon: Secondary | ICD-10-CM

## 2013-03-21 DIAGNOSIS — K648 Other hemorrhoids: Secondary | ICD-10-CM

## 2013-03-21 DIAGNOSIS — D126 Benign neoplasm of colon, unspecified: Secondary | ICD-10-CM | POA: Diagnosis not present

## 2013-03-21 DIAGNOSIS — K5730 Diverticulosis of large intestine without perforation or abscess without bleeding: Secondary | ICD-10-CM

## 2013-03-21 LAB — GLUCOSE, CAPILLARY
Glucose-Capillary: 123 mg/dL — ABNORMAL HIGH (ref 70–99)
Glucose-Capillary: 114 mg/dL — ABNORMAL HIGH (ref 70–99)

## 2013-03-21 MED ORDER — SODIUM CHLORIDE 0.9 % IV SOLN: 500.0000 mL | INTRAVENOUS | Status: DC

## 2013-03-21 NOTE — Op Note (Signed)
[facility] [address] [place], [address]   COLONOSCOPY PROCEDURE REPORT  PATIENT: [name], [name]  MR#: [id number] BIRTHDATE: 11-21-1940 , 71  yrs. old GENDER: Male ENDOSCOPIST: Iva Boop, MD, [facility] PROCEDURE DATE:  03/21/2013 PROCEDURE:   Colonoscopy with snare polypectomy ASA CLASS:   Class III INDICATIONS:Screening and surveillance,personal history of colonic polyps and Patient's immediate family history of colon cancer. MEDICATIONS: propofol (Diprivan) 200mg  IV, MAC sedation, administered by CRNA, and These medications were titrated to patient response per physician's verbal order  DESCRIPTION OF PROCEDURE:   After the risks benefits and alternatives of the procedure were thoroughly explained, informed consent was obtained.  A digital rectal exam revealed several skin tags, A digital rectal exam revealed no rectal mass, and A digital rectal exam revealed the prostate was not enlarged.   The [id number]  endoscope was introduced through the anus and advanced to the cecum, which was identified by both the appendix and ileocecal valve. No adverse events experienced.   The quality of the prep was excellent using Suprep  The instrument was then slowly withdrawn as the colon was fully examined.     COLON FINDINGS: A sessile polyp measuring 6 mm in size was found in the transverse colon.  A polypectomy was performed with a cold snare.  The resection was complete and the polyp tissue was completely retrieved.   Moderate diverticulosis was noted in the sigmoid colon.   Moderate sized internal hemorrhoids were found. The colon mucosa was otherwise normal.   A right colon retroflexion was performed.  Retroflexed views revealed internal hemorrhoids. The time to cecum=1 minutes 52 seconds.  Withdrawal time=10 minutes 35 seconds.  The scope was withdrawn and the procedure completed. COMPLICATIONS: There were no complications.  ENDOSCOPIC  IMPRESSION: 1.   Sessile polyp measuring 6 mm in size was found in the transverse colon; polypectomy was performed with a cold snare 2.   Moderate diverticulosis was noted in the sigmoid colon 3.   Moderate sized internal hemorrhoids 4.   The colon mucosa was otherwise normal - excellent prep  RECOMMENDATIONS: 1.  Timing of repeat colonoscopy will be determined by pathology findings in patient w/ prior adenomas (last w multiple max 8 mm in 2010) and mother w/ probably colon cancer. 2.   Likely 5 years   eSigned:  Iva Boop, MD, [facility] 03/21/2013 9:02 AM   cc: The Patient

## 2013-03-21 NOTE — Progress Notes (Signed)
Called to room to assist during endoscopic procedure.  Patient ID and intended procedure confirmed with present staff. Received instructions for my participation in the procedure from the performing physician.  

## 2013-03-21 NOTE — Progress Notes (Signed)
Patient did not experience any of the following events: a burn prior to discharge; a fall within the facility; wrong site/side/patient/procedure/implant event; or a hospital transfer or hospital admission upon discharge from the facility. ([id number]) Patient did not have preoperative order for IV antibiotic SSI prophylaxis. ([id number])  

## 2013-03-21 NOTE — Patient Instructions (Addendum)
I found and removed one small polyp. I will let you know pathology results and when to have another routine colonoscopy by mail. Suspect next colonoscopy will be in 5 years.  You also have hemorrhoids and diverticulosis. If the hemorrhoids are bothering you I am now doing hemorrhoid banding in the office that can help with these. Just let me know by calling and we can arrange a visit.  I appreciate the opportunity to care for you.  Iva Boop, MD, FACG  YOU HAD AN ENDOSCOPIC PROCEDURE TODAY AT THE [facility]: Refer to the procedure report that was given to you for any specific questions about what was found during the examination.  If the procedure report does not answer your questions, please call your gastroenterologist to clarify.  If you requested that your care partner not be given the details of your procedure findings, then the procedure report has been included in a sealed envelope for you to review at your convenience later.  YOU SHOULD EXPECT: Some feelings of bloating in the abdomen. Passage of more gas than usual.  Walking can help get rid of the air that was put into your GI tract during the procedure and reduce the bloating. If you had a lower endoscopy (such as a colonoscopy or flexible sigmoidoscopy) you may notice spotting of blood in your stool or on the toilet paper. If you underwent a bowel prep for your procedure, then you may not have a normal bowel movement for a few days.  DIET: Your first meal following the procedure should be a light meal and then it is ok to progress to your normal diet.  A half-sandwich or bowl of soup is an example of a good first meal.  Heavy or fried foods are harder to digest and may make you feel nauseous or bloated.  Likewise meals heavy in dairy and vegetables can cause extra gas to form and this can also increase the bloating.  Drink plenty of fluids but you should avoid alcoholic beverages for 24 hours.  ACTIVITY: Your care  partner should take you home directly after the procedure.  You should plan to take it easy, moving slowly for the rest of the day.  You can resume normal activity the day after the procedure however you should NOT DRIVE or use heavy machinery for 24 hours (because of the sedation medicines used during the test).    SYMPTOMS TO REPORT IMMEDIATELY: A gastroenterologist can be reached at any hour.  During normal business hours, 8:30 AM to 5:00 PM Monday through Friday, call [phone].  After hours and on weekends, please call the GI answering service at [phone] who will take a message and have the physician on call contact you.   Following lower endoscopy (colonoscopy or flexible sigmoidoscopy):  Excessive amounts of blood in the stool  Significant tenderness or worsening of abdominal pains  Swelling of the abdomen that is new, acute  Fever of 100F or higher    FOLLOW UP: If any biopsies were taken you will be contacted by phone or by letter within the next 1-3 weeks.  Call your gastroenterologist if you have not heard about the biopsies in 3 weeks.  Our staff will call the home number listed on your records the next business day following your procedure to check on you and address any questions or concerns that you may have at that time regarding the information given to you following your procedure. This is a courtesy call  and so if there is no answer at the home number and we have not heard from you through the emergency physician on call, we will assume that you have returned to your regular daily activities without incident.  SIGNATURES/CONFIDENTIALITY: You and/or your care partner have signed paperwork which will be entered into your electronic medical record.  These signatures attest to the fact that that the information above on your After Visit Summary has been reviewed and is understood.  Full responsibility of the confidentiality of this discharge information lies with you  and/or your care-partner.   Information on polyps,diverticulosis, hemorrhoids, and high fiber diet given to you today

## 2013-03-22 ENCOUNTER — Telehealth: Payer: Self-pay | Admitting: *Deleted

## 2013-03-22 NOTE — Telephone Encounter (Signed)
  Follow up Call-  Call back number 03/21/2013  Post procedure Call Back phone  # [phone]  Permission to leave phone message Yes     Patient questions:  Do you have a fever, pain , or abdominal swelling? no Pain Score  0 *  Have you tolerated food without any problems? yes  Have you been able to return to your normal activities? yes  Do you have any questions about your discharge instructions: Diet   no Medications  no Follow up visit  no  Do you have questions or concerns about your Care? no  Actions: * If pain score is 4 or above: No action needed, pain <4.

## 2013-03-26 ENCOUNTER — Encounter: Payer: Self-pay | Admitting: Internal Medicine

## 2013-03-26 NOTE — Progress Notes (Signed)
Quick Note:  6 mm adenoma Repeat colon about 03/2018 ______

## 2013-04-09 DIAGNOSIS — L821 Other seborrheic keratosis: Secondary | ICD-10-CM | POA: Diagnosis not present

## 2013-04-09 DIAGNOSIS — L570 Actinic keratosis: Secondary | ICD-10-CM | POA: Diagnosis not present

## 2013-04-09 DIAGNOSIS — D239 Other benign neoplasm of skin, unspecified: Secondary | ICD-10-CM | POA: Diagnosis not present

## 2013-05-27 ENCOUNTER — Emergency Department (HOSPITAL_COMMUNITY)
Admission: EM | Admit: 2013-05-27 | Discharge: 2013-05-27 | Disposition: A | Discharge status: Home / Self Care | Payer: Medicare Other | Attending: Emergency Medicine | Admitting: Emergency Medicine

## 2013-05-27 ENCOUNTER — Emergency Department (HOSPITAL_COMMUNITY): Payer: Medicare Other

## 2013-05-27 ENCOUNTER — Encounter (HOSPITAL_COMMUNITY): Payer: Self-pay | Admitting: Emergency Medicine

## 2013-05-27 DIAGNOSIS — Z7982 Long term (current) use of aspirin: Secondary | ICD-10-CM | POA: Diagnosis not present

## 2013-05-27 DIAGNOSIS — Z79899 Other long term (current) drug therapy: Secondary | ICD-10-CM | POA: Insufficient documentation

## 2013-05-27 DIAGNOSIS — I129 Hypertensive chronic kidney disease with stage 1 through stage 4 chronic kidney disease, or unspecified chronic kidney disease: Secondary | ICD-10-CM | POA: Insufficient documentation

## 2013-05-27 DIAGNOSIS — Z860100 Personal history of colon polyps, unspecified: Secondary | ICD-10-CM | POA: Insufficient documentation

## 2013-05-27 DIAGNOSIS — N189 Chronic kidney disease, unspecified: Secondary | ICD-10-CM | POA: Insufficient documentation

## 2013-05-27 DIAGNOSIS — F17200 Nicotine dependence, unspecified, uncomplicated: Secondary | ICD-10-CM | POA: Diagnosis not present

## 2013-05-27 DIAGNOSIS — E785 Hyperlipidemia, unspecified: Secondary | ICD-10-CM | POA: Diagnosis not present

## 2013-05-27 DIAGNOSIS — Z87442 Personal history of urinary calculi: Secondary | ICD-10-CM | POA: Insufficient documentation

## 2013-05-27 DIAGNOSIS — Z87898 Personal history of other specified conditions: Secondary | ICD-10-CM | POA: Insufficient documentation

## 2013-05-27 DIAGNOSIS — R110 Nausea: Secondary | ICD-10-CM | POA: Insufficient documentation

## 2013-05-27 DIAGNOSIS — Z9889 Other specified postprocedural states: Secondary | ICD-10-CM | POA: Diagnosis not present

## 2013-05-27 DIAGNOSIS — Z8719 Personal history of other diseases of the digestive system: Secondary | ICD-10-CM | POA: Diagnosis not present

## 2013-05-27 DIAGNOSIS — Z8739 Personal history of other diseases of the musculoskeletal system and connective tissue: Secondary | ICD-10-CM | POA: Diagnosis not present

## 2013-05-27 DIAGNOSIS — N23 Unspecified renal colic: Secondary | ICD-10-CM | POA: Diagnosis not present

## 2013-05-27 DIAGNOSIS — Z8709 Personal history of other diseases of the respiratory system: Secondary | ICD-10-CM | POA: Insufficient documentation

## 2013-05-27 DIAGNOSIS — N201 Calculus of ureter: Secondary | ICD-10-CM | POA: Diagnosis not present

## 2013-05-27 DIAGNOSIS — E119 Type 2 diabetes mellitus without complications: Secondary | ICD-10-CM | POA: Diagnosis not present

## 2013-05-27 DIAGNOSIS — N200 Calculus of kidney: Secondary | ICD-10-CM | POA: Diagnosis not present

## 2013-05-27 LAB — URINE MICROSCOPIC-ADD ON

## 2013-05-27 LAB — URINALYSIS, ROUTINE W REFLEX MICROSCOPIC
Bilirubin Urine: NEGATIVE
Glucose, UA: NEGATIVE mg/dL
Protein, ur: NEGATIVE mg/dL
Urobilinogen, UA: 0.2 mg/dL (ref 0.0–1.0)

## 2013-05-27 MED ORDER — OXYCODONE-ACETAMINOPHEN 5-325 MG PO TABS
1.0000 | ORAL_TABLET | Freq: Once | ORAL | Status: AC
  Administered 2013-05-27: 1 via ORAL
  Filled 2013-05-27: qty 1

## 2013-05-27 MED ORDER — OXYCODONE-ACETAMINOPHEN 5-325 MG PO TABS: 1.0000 | ORAL_TABLET | ORAL | Status: DC | PRN

## 2013-05-27 MED ORDER — TAMSULOSIN HCL 0.4 MG PO CAPS: ORAL_CAPSULE | ORAL | Status: DC

## 2013-05-27 NOTE — ED Notes (Signed)
Pt c/o left flank pain with nausea onset Friday morning. Pt drank a lot of liquids and pain went away. Pt had same pain again last night. Pt took Vicodin at 0630 today with a little relief.

## 2013-05-28 ENCOUNTER — Telehealth: Payer: Self-pay | Admitting: Pulmonary Disease

## 2013-05-28 DIAGNOSIS — Z87442 Personal history of urinary calculi: Secondary | ICD-10-CM

## 2013-05-28 NOTE — Telephone Encounter (Signed)
Spoke with patient--- Patient reports recent visit to ED 05/27/13 for kidney stone States he has a 5mm stone in between bladder and kidneys Pt requesting a referral to an urologist-- says it has been a long time since he has seen one  and feels he should get back to seeing one regularly Pt states he used to see Dr. Marcelyn Bruins-- whom moved out of town but pt thinks he may have now returned in the area If not patient would take Dr. Jodelle Green recs Dr. Kriste Basque please advise, thank you!  Last OV: 01/30/13 Next OV:08/08/13

## 2013-05-28 NOTE — ED Provider Notes (Signed)
CSN: [id number]     Arrival date & time 05/27/13  0807 History     First MD Initiated Contact with Patient 05/27/13 0830     Chief Complaint  Patient presents with  . Flank Pain  . Nausea   (Consider location/radiation/quality/duration/timing/severity/associated sxs/prior Treatment) HPI Comments: [name] is a 72 y.o. Male who has had intermittent, and worsening, campy left flank pain for 2 days. It radiates to the LLQ. No urinary sx. Similar pain in past with ureter colic from stones. No fever, chills, weakness or dizziness. No better with OTC meds, and usual meds. No other known modifying factors.  Patient is a 72 y.o. male presenting with flank pain. The history is provided by the patient and the spouse.  Flank Pain    Past Medical History  Diagnosis Date  . Cigarette smoker   . Hyperlipidemia   . DM (diabetes mellitus)   . Hiatal hernia   . Diverticulosis of colon   . Colon polyp   . Hemorrhoids   . History of renal calculi   . History of BPH   . Neck pain   . Lumbar back pain   . Gastritis   . GERD (gastroesophageal reflux disease)   . IBS (irritable bowel syndrome)   . Shortness of breath   . Chronic kidney disease     stone  . Bronchitis     hx of  . COPD (chronic obstructive pulmonary disease)     Pt reports does not have history of COPD  . HTN (hypertension)     pt states med is for kidney stones and not high bp.    Past Surgical History  Procedure Laterality Date  . Cervical spine surgery  1986, 1995  . Uvulopalatopharyngoplasty  2001    Dr Annalee Genta  . Cystoscopy  11/03    TURP and stone removal - Dr Logan Bores  . Dental implants  2007  . Lumbar microdiscectomy  12/07    L3-4 by Dr Jordan Likes  . Upper gastrointestinal endoscopy  09/07/2010    erosive gastritis, GERD  . Colonoscopy w/ biopsies and polypectomy  05/06/2009    adenomous polyp, diverticulosis, external hemorrhoids  . Back surgery      x 3  . Rotator cuff repair      left shoulder  .  Lithotripsy      x 3  . Anterior lat lumbar fusion  08/14/2012    Procedure: ANTERIOR LATERAL LUMBAR FUSION 1 LEVEL;  Surgeon: Temple Pacini, MD;  Location: [facility];  Service: Neurosurgery;  Laterality: Left;  Left lumbar three-four extreme lumbar interbody fusion with percutaneous pedicle screws   . Lumbar percutaneous pedicle screw 1 level  08/14/2012    Procedure: LUMBAR PERCUTANEOUS PEDICLE SCREW 1 LEVEL;  Surgeon: Temple Pacini, MD;  Location: [facility];  Service: Neurosurgery;  Laterality: Left;  Left lumbar three-four extreme lumbar interbody fusion with percutaneous pedicle screws    Family History  Problem Relation Age of Onset  . Diabetes Father   . Colon cancer Mother     ? she had met dis and chemoRx  . Esophageal cancer Neg Hx   . Rectal cancer Neg Hx   . Stomach cancer Neg Hx    History  Substance Use Topics  . Smoking status: Current Some Day Smoker -- 0.25 packs/day for 35 years  . Smokeless tobacco: Never Used     Comment: 1-3 cigarettes daily when golfing----Counseling given in exam room to quit  smoking   . Alcohol Use: 1.5 oz/week    3 drink(s) per week     Comment: social    Review of Systems  Genitourinary: Positive for flank pain.  All other systems reviewed and are negative.    Allergies  Review of patient's allergies indicates no known allergies.  Home Medications   Current Outpatient Rx  Name  Route  Sig  Dispense  Refill  . acyclovir (ZOVIRAX) 400 MG tablet   Oral   Take 200 mg by mouth 2 (two) times daily.         Marland Kitchen aspirin 81 MG tablet   Oral   Take 81 mg by mouth daily.           Marland Kitchen FIBER PO   Oral   Take 1 tablet by mouth daily.         Marland Kitchen gemfibrozil (LOPID) 600 MG tablet   Oral   Take 300 mg by mouth 2 (two) times daily.         Marland Kitchen HYDROcodone-acetaminophen (NORCO/VICODIN) 5-325 MG per tablet   Oral   Take 1 tablet by mouth every 6 (six) hours as needed for pain.          . metFORMIN (GLUCOPHAGE) 500 MG tablet    Oral   Take 250 mg by mouth 2 (two) times daily with a meal.         . sildenafil (VIAGRA) 100 MG tablet   Oral   Take 100 mg by mouth daily as needed for erectile dysfunction.         . simvastatin (ZOCOR) 20 MG tablet   Oral   Take 20 mg by mouth every evening.         Marland Kitchen oxyCODONE-acetaminophen (PERCOCET/ROXICET) 5-325 MG per tablet   Oral   Take 1 tablet by mouth every 4 (four) hours as needed for pain.   15 tablet   0   . tamsulosin (FLOMAX) 0.4 MG CAPS capsule      1 q HS to aid stone passage   7 capsule   0    BP 131/77  Pulse 65  Temp(Src) 97.6 F (36.4 C) (Oral)  Resp 18  Ht 5' 11.5" (1.816 m)  Wt 177 lb (80.287 kg)  BMI 24.35 kg/m2  SpO2 96% Physical Exam  Nursing note and vitals reviewed. Constitutional: He is oriented to person, place, and time. He appears well-developed and well-nourished.  HENT:  Head: Normocephalic and atraumatic.  Right Ear: External ear normal.  Left Ear: External ear normal.  Eyes: Conjunctivae and EOM are normal. Pupils are equal, round, and reactive to light.  Neck: Normal range of motion and phonation normal. Neck supple.  Cardiovascular: Normal rate, regular rhythm, normal heart sounds and intact distal pulses.   Pulmonary/Chest: Effort normal and breath sounds normal. He exhibits no bony tenderness.  Abdominal: Soft. Normal appearance. He exhibits no mass. There is no tenderness. There is no guarding.  Genitourinary:  No CVAT.  Musculoskeletal: Normal range of motion.  Neurological: He is alert and oriented to person, place, and time. He has normal strength. No cranial nerve deficit or sensory deficit. He exhibits normal muscle tone. Coordination normal.  Skin: Skin is warm, dry and intact.  Psychiatric: He has a normal mood and affect. His behavior is normal. Judgment and thought content normal.    ED Course   Procedures (including critical care time)  Medications  oxyCODONE-acetaminophen (PERCOCET/ROXICET) 5-325  MG per tablet 1 tablet (1 tablet  Oral Given 05/27/13 0900)    Reeval. At D/C- He is comfortable.  Labs Reviewed  URINALYSIS, ROUTINE W REFLEX MICROSCOPIC - Abnormal; Notable for the following:    Specific Gravity, Urine 1.037 (*)    Hgb urine dipstick TRACE (*)    All other components within normal limits  URINE MICROSCOPIC-ADD ON - Abnormal; Notable for the following:    Crystals CA OXALATE CRYSTALS (*)    All other components within normal limits   Ct Abdomen Pelvis Wo Contrast  05/27/2013   *RADIOLOGY REPORT*  Clinical Data: Left flank pain  CT ABDOMEN AND PELVIS WITHOUT CONTRAST  Technique:  Multidetector CT imaging of the abdomen and pelvis was performed following the standard protocol without intravenous contrast.  Comparison: 08/18/2010  Findings: Partial obstruction of the left kidney.  There is a 5 mm stone in the left ureter at the level of the iliac crossing causing obstruction.  No obstruction of the right kidney.  2 mm nonobstructing right lower pole calculus.  3 cm right lower pole simple cyst.  2 mm nonobstructing stone in the left lower pole.  Lung bases are clear.  The liver gallbladder and bile ducts are normal.  Pancreas and spleen are normal.  Negative for bowel obstruction.  No bowel thickening.  Appendix is normal.  Sigmoid diverticulosis.  Prostate enlargement with small calcifications.  No mass or adenopathy.  IMPRESSION: Partial obstruction of the left kidney by 5 mm stone in the left ureter at the level of the iliac crossing.  Small nonobstructing renal calculi bilaterally.   Original Report Authenticated By: Janeece Riggers, M.D.   1. Ureteral colic   2. Ureteral stone     MDM  Mid-size distal ureteral stone, has good chance to pass. His pain improved with Percocet. Doubt metabolic instability, serious bacterial infection or impending vascular collapse; the patient is stable for discharge.  Nursing Notes Reviewed/ Care Coordinated Applicable Imaging  Reviewed Interpretation of Laboratory Data incorporated into ED treatment  Plan: Home Medications- Percocet, Flomax; Home Treatments- strain urine; return here if the recommended treatment, does not improve the symptoms; Recommended follow up- Urology 1 week  Flint Melter, MD 05/28/13 [id number]

## 2013-05-29 DIAGNOSIS — N200 Calculus of kidney: Secondary | ICD-10-CM | POA: Diagnosis not present

## 2013-05-29 DIAGNOSIS — N139 Obstructive and reflux uropathy, unspecified: Secondary | ICD-10-CM | POA: Diagnosis not present

## 2013-05-29 DIAGNOSIS — N201 Calculus of ureter: Secondary | ICD-10-CM | POA: Diagnosis not present

## 2013-06-07 DIAGNOSIS — N201 Calculus of ureter: Secondary | ICD-10-CM | POA: Diagnosis not present

## 2013-06-07 DIAGNOSIS — N200 Calculus of kidney: Secondary | ICD-10-CM | POA: Diagnosis not present

## 2013-07-03 DIAGNOSIS — Z23 Encounter for immunization: Secondary | ICD-10-CM | POA: Diagnosis not present

## 2013-08-08 ENCOUNTER — Other Ambulatory Visit (INDEPENDENT_AMBULATORY_CARE_PROVIDER_SITE_OTHER): Payer: Medicare Other

## 2013-08-08 ENCOUNTER — Encounter: Payer: Self-pay | Admitting: Pulmonary Disease

## 2013-08-08 ENCOUNTER — Ambulatory Visit (INDEPENDENT_AMBULATORY_CARE_PROVIDER_SITE_OTHER): Payer: Medicare Other | Admitting: Pulmonary Disease

## 2013-08-08 VITALS — BP 120/72 | HR 66 | Temp 98.1°F | Ht 71.0 in | Wt 179.6 lb

## 2013-08-08 DIAGNOSIS — E119 Type 2 diabetes mellitus without complications: Secondary | ICD-10-CM | POA: Diagnosis not present

## 2013-08-08 DIAGNOSIS — K589 Irritable bowel syndrome without diarrhea: Secondary | ICD-10-CM

## 2013-08-08 DIAGNOSIS — M545 Low back pain, unspecified: Secondary | ICD-10-CM

## 2013-08-08 DIAGNOSIS — Z86010 Personal history of colonic polyps: Secondary | ICD-10-CM

## 2013-08-08 DIAGNOSIS — Z87442 Personal history of urinary calculi: Secondary | ICD-10-CM

## 2013-08-08 DIAGNOSIS — E785 Hyperlipidemia, unspecified: Secondary | ICD-10-CM | POA: Diagnosis not present

## 2013-08-08 DIAGNOSIS — J449 Chronic obstructive pulmonary disease, unspecified: Secondary | ICD-10-CM | POA: Diagnosis not present

## 2013-08-08 DIAGNOSIS — M159 Polyosteoarthritis, unspecified: Secondary | ICD-10-CM

## 2013-08-08 DIAGNOSIS — I10 Essential (primary) hypertension: Secondary | ICD-10-CM

## 2013-08-08 DIAGNOSIS — K219 Gastro-esophageal reflux disease without esophagitis: Secondary | ICD-10-CM

## 2013-08-08 LAB — LIPID PANEL
Cholesterol: 141 mg/dL (ref 0–200)
HDL: 44.4 mg/dL (ref >39.00)
VLDL: 10.2 mg/dL (ref 0.0–40.0)

## 2013-08-08 LAB — BASIC METABOLIC PANEL
BUN: 19 mg/dL (ref 6–23)
CO2: 27 mEq/L (ref 19–32)
Calcium: 9.5 mg/dL (ref 8.4–10.5)
GFR: 81.35 mL/min (ref >60.00)
Glucose, Bld: 142 mg/dL — ABNORMAL HIGH (ref 70–99)
Potassium: 4.6 mEq/L (ref 3.5–5.1)

## 2013-08-08 MED ORDER — GEMFIBROZIL 600 MG PO TABS: 300.0000 mg | ORAL_TABLET | Freq: Two times a day (BID) | ORAL | Status: DC

## 2013-08-08 MED ORDER — SIMVASTATIN 20 MG PO TABS: 20.0000 mg | ORAL_TABLET | Freq: Every evening | ORAL | Status: DC

## 2013-08-08 MED ORDER — METFORMIN HCL 500 MG PO TABS: 250.0000 mg | ORAL_TABLET | Freq: Two times a day (BID) | ORAL | Status: DC

## 2013-08-08 MED ORDER — ACYCLOVIR 400 MG PO TABS: 200.0000 mg | ORAL_TABLET | Freq: Two times a day (BID) | ORAL | Status: DC

## 2013-08-08 MED ORDER — SILDENAFIL CITRATE 100 MG PO TABS: 100.0000 mg | ORAL_TABLET | Freq: Every day | ORAL | Status: DC | PRN

## 2013-08-08 NOTE — Progress Notes (Signed)
Subjective:    Patient ID: [name], male    DOB: 11/05/1940, 72 y.o.   MRN: [id number]  HPI 72 y/o WM here for a follow up visit... he has multiple medical problems including COPD & he still smokes a few cigs daily;  HBP;  Hyperlipidemia;  DM;  HH w/ decr LES;  Divertics & colon polyps;  hx kidney stones/ BPH w/ TURP followed by DrEvans & DrKimbrough;  DJD w/ neck & back pain- s/p lumbar diskectomy by DrPool...  ~  December 13, 2011:  58mo ROV & he continues to do well overall> no new complaints or concerns... 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  Notes some stress w/ son- seizures (doing about the same), and daugh (chr lyme w/ stem cell tx in [place] last yr)...    He saw DrGessner for GI f/u 10/12> IBS & GERD w/ rec to stay on Omep40mg /d & try Align; he took it for awhile, no real change & stopped it but rec to restart any time his BMs are loose or irreg...    He had left shoulder surg 11/12 by DrSypher> arthroscopy, debridement, & rotator cuff repair; went thru PT after that & now doing very well, no pain & good ROM.Marland KitchenMarland Kitchen CXR 2/13 showed normal heart size & clear lungs, DJD in spine, NAD... LABS 2/13:  FLP- at goals on Simva20+Lopid600 x HDL=39;  Chems- ok x BS=115 A1c=6.7;  CBC- wnl;  TSH=2.06;  PSA=1.94 Note> prev PSA values were 0.63 & 0.49 (?incr PSA velocity & rec recheck 58mo)...  ~  July 06, 2012:  50mo ROV & [name] CC is his LBP & DrPool plans surg in October> MRI Lumbar spine 8/13 showed postop changes at L3-4 w/ epid scar & endplate spur impacting L3 nerve root & foraminal narrowing, bilat L5 pars defect w/ anterolisthesis of L5 on S1...    He is stable on his HCT, Urocit-K, Metformin, Simva20 & Lopid...    We reviewed prob list, meds, xrays and labs> see  below for updates >> OK Flu vaccine today. LABS 8/13 showed:  Chems- ok w/ BS=134, normal renal funct, A1c=6.7, & PSA=0.66... Preop labs, XRays, EKG to be done at the hospital...  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    COPD, smoker> still smoking but not regularly per pt; intermittent bronchitic exac but none recently; remote PFTs w/ mod obstruction- he has declined regular inhaler meds; CXRs have been clear...    HBP> on ASA81, HCT50-1/2, Urocit-K; BP= 116/68 & he denies CP, palpit, SOB, edema, etc...    Hyperlipid> on Simva20, Lopid600; FLP 4/14 shows TChol 127, TG 100, HDL 34, LDL 73; rec to incr exercise, same med + diet...    DM> on Metform500-1/2 Bid; labs 4/14 showed BS=115, A1c=7.2 & we reviewed diet/ exercise/ med rx- continue same for now...    GI- HH, Divertics, Polyps, Hems> on Prilosec20 prn & Align; he denies abd pain, dyspagia, n/v, c/d, blood seen; notes some bloating/ gas- followed by [employer] & due for f/u  colonoscopy (mother had colon ca)...    Hx kidney stones, BPH> stable on the Hct & Urocit-K w/o recurrent nephrolithiasis; mild LTOS but emptying satis...    DJD, Neck pain, LBP> s/p lumbar disc surg 2007 by DrPool and Lumbar fusion w/ pedicle screww 10/13- now improved by his report & off the Percocet...    Anxiety> he was prev on Valium prn but stopped this in the interval, doing satis... We reviewed prob list, meds, xrays and labs> see below for updates >>  Meds refilled today... LABS 4/14:  FLP- at goals on Simva20+Lopid600;  Chems- ok x BS=115 A1c=7.2 on Metform250Bid;  CBC- wnl;  TSH=1.71;  PSA=0.59...   ~  August 08, 2013:  40mo ROV & [name] reports that he is still smoking a few- but just on the [place]; his back continues to improve after the back surg 30yr ago, he  continues to walk his rescue dog etc...    Lipids are regulated w/ Simva20 & Lopid600; FLP 10/14 looks good & within parameters...    His DM is controlled on Metform500-1/2Bid and Labs 10/14 show BS=142, A1c=7.2; Creat=1.0 & he is asked to increase back to 500mg Bid + we reviewed diet & exercise program...     He had a f/u w/ DrGessner including Colonoscopy 6/14 showing 6mm polyp in the transverse colon (tub adenoma), mod divertics in sigmoid, mod int hems; f/u planned for 29yrs...    He had an ER visit 8/14> left flank pain & kidney stone found; Urine had calc oxalate crystals and trace blood, CT Abd showed part obstruction w/ 5mm stone in left ureter + sm nonobstructing kid stones bilat; he was given Percocet & has followed up w/ DrGrapey=> on Urocit-K daily along w/ HCT50mg -1/2tabQam... We reviewed prob list, meds, xrays and labs> see below for updates >> he had the 2014 Flu vaccine in Sept... Meds refilled for 90d supplies per request... LABS 10/14:  FLP- ok on Simva20+Lopid600;  Chems- ok x BS=142, A1c=7.2.Marland KitchenMarland Kitchen           Problem List:   S/P ENT SURG by DrShoemaker 2000 for SNORING- ?UPPP w/ resolution of snoring problem...  COPD (ICD-496) - smoker w/ hx of intermittent bronchitic problems, no chronic dyspnea, cough, sputum, etc; not on regular meds and hasn't required antibiotics in quite some time... ~  PFT's 2000 showed FVC= 3.33 (74%), FEV1= 1.76 (45%), %1sec= 52, mid-flows= 34% pred. ~  baseline CXR w/ sl hyperinflation, clear, NAD.Marland Kitchen. ~  f/u CXR 2/11 shows no acute changes... ~  CXR 2/12 showed clear lungs, NAD.Marland Kitchen. ~  CXR 2/13 showed normal heart size & clear lungs, DJD in spine, NAD... ~  10/14: he continues to smoke, but only on the golf [place]; he denies chr resp symptoms...  CIGARETTE SMOKER (ICD-305.1) - still smokes on occas but not regularly per pt- eg. on the golf course... he was able to decr smoking w/ Chantix Rx... he knows that he needs to discontinue all  smoking.  HYPERTENSION (ICD-401.9) - diet controlled, but he was placed on HCTZ (50mg - now taking 1/2 tab daily) by DrEvans for kidney stone prevention in about 2003, and he wants to stop it... asked to monitor BP at home w/ digital cuff...   ~  8/12: BP today = 118/80> denies HA, fatigue, visual changes, CP, palipit, dizziness, syncope, dyspnea, edema, etc...  ~  2/13: BP= 120/74 & he remains mostly asymptomatic... ~  9/13: BP= 118/68 & he denies CP, palpit, SOB, edema, etc... ~  EKG  10/13 showed NSR, rate70, wnl, NAD... ~  4/14: on ASA81, HCT50-1/2, Urocit-K; BP= 116/68 & he denies CP, palpit, SOB, edema, etc. ~  10/14: on Hct50-1/2 & Urocit-K; BP= 120/72 & he remains largely asymptomatic...  HYPERLIPIDEMIA (ICD-272.4) - on SIMVASTATIN 20mg /d & LOPID 600mg /d... his TG level was [phone] range in 1995-6 w/ Gemfibrizol started then... ~  FLP 1/07 on Lopid showed TChol 207, TG 77, HDL 41, LDL 147... rec> Simva20 added. ~  FLP 11/07 showed TChol 141, TG 82, HDL 37, LDL 87 ~  FLP 7/08 showed TChol 157, TG 99, HDL 44, LDL 94... Stable on Simva20 + Lopid600. ~  FLP 5/10 showed TChol 133, TG 97, HDL 43, LDL 70 ~  FLP 2/11 showed TChol 139, TG 105, HDL 47, LDL 71... Stable on Simva20 + Lopid600. ~  FLP 8/12 showed TChol 145, TG 99, HDL 49, LDL 76 ~  FLP 2/13 showed TChol 139, TG 114, HDL 39, LDL 78... Stable on Simva20 + Lopid600 ~  4/14:  on Simva20, Lopid600; FLP 4/14 shows TChol 127, TG 100, HDL 34, LDL 73; rec to incr exercise, same med + diet. ~  FLP 10/14 on Simva20+Lopid600 showed TChol 141, TG 51, HDL 44, LDL 86  DIABETES MELLITUS (ICD-250.00) - + FamHx w/ DM in father> pt diet controlled in past- but in 2007 he took Medrol Rx from DrPool for LBP and developed problems while on a mission trip to [place]... Metformin started then and adjusted>> ~  labs 11/07 showed BS= 98, A1c= 7.1 ~  labs 7/08 showed BS= 135, A1c= 6.7.Marland KitchenMarland Kitchen Continue Metformin Rx. ~  labs 12/08 showed BS= 111, A1c= 6.9 ~  labs  5/10 showed BS= 107, A1c= 6.5.Marland KitchenMarland Kitchen Continue Metformin + diet. ~  labs 2/11 showed BS= 114 ~  Labs 8/12 showed BS= 131, A1c= 6.7.Marland KitchenMarland Kitchen Continue Metformin & diet. ~  Labs 2/13 showed BS= 115, A1c= 6.7 ~  3/13:  Neg ophthalmology eval by DrStoneburner- no retinopathy, mild cats, good vision... ~  4/14:  on Metform500-1/2 Bid; labs 4/14 showed BS=115, A1c=7.2 & we reviewed diet/ exercise/ med rx- continue same for now. ~  10/14: on Metform500-1/2Bid; Labs showed BS=142, A1c=7.2 and pt asked to incr back to 500mg Bid as his Creat is normal at 1.0...  HIATAL HERNIA (ICD-553.3) - he had an EGD in 1987 showing a patulous LES, GERD, antritis... ~  EGD 11/11 by DrGessner showed esophagitis, gastritis, duodenitis> neg HPylori, treated w/ Omeprazole 40mg /d...  DIVERTICULOSIS OF COLON (ICD-562.10) >> R/O IBS... COLONIC POLYPS (ICD-211.3)  HEMORRHOIDS (ICD-455.6) - there is a pos family hx of colon cancer in his mother... ~  colonoscopy 7/04 showed divertics, hems, no polyps... f/u planned 5 yrs. ~  f/u colon 7/10 DrGessner w/ 6 polyps removed- max 8mm- mult tubular adenomas removed & f/u planned 10yrs. ~  CTAbd 11/11 showed mild bibasilar atx, right hepatic cyst, NAD... ~  9/13:  He is due for f/u colonoscopy by DrGessner but want to wait til his recovery from the upcoming back surg... ~  6/14: he had f/u colonoscopy by DrGessner> 6mm polyp in the transverse colon (tub adenoma), mod divertics in sigmoid, mod int hems; f/u planned for 16yrs.   RENAL CALCULUS, HX OF (ICD-V13.01) - on HCTZ 50mg - 1/2 daily and Urocit-K Bid per DrEvans... he wants to stop these meds but prefers to have Urology appt 1st to discuss this & we will set this up for him. ~  s/p right ureteroscopy 2002 by DrEvans for stone... ~  He  had an ER eval 8/14>  left flank pain & kidney stones found; Urine had calc oxalate crystals and trace blood, CT Abd showed part obstruction w/ 5mm stone in left ureter + sm nonobstructing kid stones bilat; he was  given Percocet & has followed up w/ DrGrapey=> on Urocit-K daily along w/ HCT50mg -1/2tabQam...  BENIGN PROSTATIC HYPERTROPHY, HX OF (ICD-V13.8) - s/p TURP & removal of bladder stone in 2003 by DrEvans... 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 ~  11/12:  He had left shoulder arthroscopy w/ rotator cuff repair, subacromial decompression, & distal clavical resection by drSypher. Hx of NECK PAIN (ICD-723.1) - s/p CSpine surg x2 in 1986 & 1995... Hx of BACK PAIN, LUMBAR (ICD-724.2) - s/p Lumbar microdiscectomy 12/07 by DrPool... ~  9/13:  He is sched for more back surg by DrPool for 10/13 => Lumbar fusion & pedicle screw... ~  10/14: he reports continued improvement w/ his exercise program since surg in 2013...  Health Maintenance: ~  GI= DrGessner & he is due for f/u colonoscopy soon... ~  GU= prev DrEvans & we will refer to [facility] per his request... DRE neg, PSA = wnl... ~  Immunizations:  gets yearly Flu vaccine... Tetanus? probably before his [place] trip in 2006... PNEUMOVAX given 2/11;  He had Shingles vaccine in 2012.   Past Surgical History  Procedure Laterality Date  . Cervical spine surgery  1986, 1995  . Uvulopalatopharyngoplasty  2001    Dr Annalee Genta  . Cystoscopy  11/03    TURP and stone removal - Dr Logan Bores  . Dental implants  2007  . Lumbar microdiscectomy  12/07    L3-4 by Dr Jordan Likes  . Upper gastrointestinal endoscopy  09/07/2010    erosive gastritis, GERD  . Colonoscopy w/ biopsies and polypectomy  05/06/2009    adenomous polyp, diverticulosis, external hemorrhoids  . Back surgery      x 3  . Rotator cuff repair      left shoulder  . Lithotripsy      x 3  . Anterior lat lumbar fusion  08/14/2012    Procedure: ANTERIOR LATERAL LUMBAR FUSION 1 LEVEL;  Surgeon: Temple Pacini, MD;  Location: [facility]  NEURO ORS;  Service: Neurosurgery;  Laterality: Left;  Left lumbar three-four extreme lumbar interbody fusion with percutaneous pedicle screws   . Lumbar percutaneous pedicle screw 1 level  08/14/2012    Procedure: LUMBAR PERCUTANEOUS PEDICLE SCREW 1 LEVEL;  Surgeon: Temple Pacini, MD;  Location: [facility] NEUR[facility] OR[facility];  Service: Neurosurgery;  Laterality: Left;  Left lumbar three-four extreme lumbar interbody fusion with percutaneous pedicle screws     Outpatient Encounter Prescriptions as of 08/08/2013  Medication Sig Dispense Refill  . acyclovir (ZOVIRAX) 400 MG tablet Take 200 mg by mouth 2 (two) times daily.      Marland Kitchen aspirin 81 MG tablet Take 81 mg by mouth daily.        Marland Kitchen FIBER PO Take 1 tablet by mouth daily.      Marland Kitchen gemfibrozil (LOPID) 600 MG tablet Take 300 mg by mouth 2 (two) times daily.      . metFORMIN (GLUCOPHAGE) 500 MG tablet Take 250 mg by mouth 2 (two) times daily with a meal.      . sildenafil (VIAGRA) 100 MG tablet Take  100 mg by mouth daily as needed for erectile dysfunction.      . simvastatin (ZOCOR) 20 MG tablet Take 20 mg by mouth every evening.      . [DISCONTINUED] HYDROcodone-acetaminophen (NORCO/VICODIN) 5-325 MG per tablet Take 1 tablet by mouth every 6 (six) hours as needed for pain.       . [DISCONTINUED] oxyCODONE-acetaminophen (PERCOCET/ROXICET) 5-325 MG per tablet Take 1 tablet by mouth every 4 (four) hours as needed for pain.  15 tablet  0  . [DISCONTINUED] tamsulosin (FLOMAX) 0.4 MG CAPS capsule 1 q HS to aid stone passage  7 capsule  0   No facility-administered encounter medications on file as of 08/08/2013.    No Known Allergies   Current Medications, Allergies, Past Medical History, Past Surgical History, Family History, and Social History were reviewed in [employer] record.    Review of Systems         See HPI - all other systems neg except as noted...       The patient complains of abdominal discomfort.  The patient denies anorexia,  fever, weight gain, vision loss, decreased hearing, hoarseness, chest pain, syncope, dyspnea on exertion, peripheral edema, prolonged cough, headaches, hemoptysis, melena, hematochezia, severe indigestion/heartburn, hematuria, incontinence, muscle weakness, suspicious skin lesions, transient blindness, difficulty walking, depression, unusual weight change, abnormal bleeding, enlarged lymph nodes, and angioedema.    Objective:   Physical Exam     WD, WN, 72 y/o WM in NAD... GENERAL:  Alert & oriented; pleasant & cooperative... HEENT:  [place]/AT, EOM-wnl, PERRLA, EACs-clear, TMs-wnl, NOSE-clear, THROAT-clear & wnl. NECK:  Supple w/ decrROM; no JVD; normal carotid impulses w/o bruits; no thyromegaly or nodules palpated; no lymphadenopathy. CHEST:  Clear to P & A; without wheezes/ rales/ or rhonchi heard... HEART:  Regular Rhythm; without murmurs/ rubs/ or gallops detected... ABDOMEN:  Soft & nontender; normal bowel sounds; no organomegaly or masses palpated... EXT: without deformities, mild arthritic changes; no varicose veins/ venous insuffic/ or edema. NEURO: CNs intact, no focal neuro deficits... DERM:  No lesions noted; no rash etc...  RADIOLOGY DATA:  Reviewed in the EPIC EMR & discussed w/ the patient...  LABORATORY DATA:  Reviewed in the EPIC EMR & discussed w/ the patient...    Assessment & Plan:    ?HSV1, recurrent>  He treats w/ Acyclovir 400mg - 5x per day for outbreaks & decr to Bid for suppression...  COPD, irreg smoker>  Reminded to quit completely; denies cough, phlegm, SOB, CP, etc; he is not inclined to take meds or do f/u breathing tests, etc...  HBP>  Never much of a problem; he had kidney stones & controlled on diet + the HCT 25mg /d w/ Urocit-K...  HYPERLIPID>  Labs look good on Simva20 + Lopid600, continue same...  DM>  Labs stable w/ A1c= 7.2 on the Metform1/2Bid + we discussed incr back to 1 tab Bid...  GI> Hx of HH, GERD, Divertics, IBS, polyps, etc>  He saw  DrGessner w/ rec to continue PPI & Align; f/u colon done 6/14 w/ another polyp removed...  Kidney Stones>  Known nephrolithiasis in kidneys, & another left ureteral stone 8/14 treated by DrGrapey...  DJD w/ neck & back pain intermittently> he uses OTC meds as needed; s/p back surg per DrPool...   Patient's Medications  New Prescriptions   No medications on file  Previous Medications   ASPIRIN 81 MG TABLET    Take 81 mg by mouth daily.     FIBER PO  Take 1 tablet by mouth daily.  Modified Medications   Modified Medication Previous Medication   ACYCLOVIR (ZOVIRAX) 400 MG TABLET acyclovir (ZOVIRAX) 400 MG tablet      Take 0.5 tablets (200 mg total) by mouth 2 (two) times daily.    Take 200 mg by mouth 2 (two) times daily.   GEMFIBROZIL (LOPID) 600 MG TABLET gemfibrozil (LOPID) 600 MG tablet      Take 0.5 tablets (300 mg total) by mouth 2 (two) times daily.    Take 300 mg by mouth 2 (two) times daily.   METFORMIN (GLUCOPHAGE) 500 MG TABLET metFORMIN (GLUCOPHAGE) 500 MG tablet      Take 1 tablet (500 mg total) by mouth 2 (two) times daily with a meal.    Take 250 mg by mouth 2 (two) times daily with a meal.   SILDENAFIL (VIAGRA) 100 MG TABLET sildenafil (VIAGRA) 100 MG tablet      Take 1 tablet (100 mg total) by mouth daily as needed for erectile dysfunction.    Take 100 mg by mouth daily as needed for erectile dysfunction.   SIMVASTATIN (ZOCOR) 20 MG TABLET simvastatin (ZOCOR) 20 MG tablet      TAKE 1 TABLET BY MOUTH AT BEDTIME    Take 20 mg by mouth every evening.  Discontinued Medications   HYDROCODONE-ACETAMINOPHEN (NORCO/VICODIN) 5-325 MG PER TABLET    Take 1 tablet by mouth every 6 (six) hours as needed for pain.    METFORMIN (GLUCOPHAGE) 500 MG TABLET    Take 500 mg by mouth 2 (two) times daily with a meal.   OXYCODONE-ACETAMINOPHEN (PERCOCET/ROXICET) 5-325 MG PER TABLET    Take 1 tablet by mouth every 4 (four) hours as needed for pain.   TAMSULOSIN (FLOMAX) 0.4 MG CAPS CAPSULE    1  q HS to aid stone passage

## 2013-08-08 NOTE — Patient Instructions (Signed)
Today we updated your med list in our EPIC system...    Continue your current medications the same...  We refilled your meds per request today...  Today we did your follow up Lipid & DM labs...    We will contact you w/ the results when available...   Call for any questions...  Let's plan a follow up visit in 76mo, sooner if needed for problems.Marland KitchenMarland Kitchen

## 2013-08-28 ENCOUNTER — Other Ambulatory Visit: Payer: Self-pay | Admitting: Pulmonary Disease

## 2013-08-28 MED ORDER — METFORMIN HCL 500 MG PO TABS: 500.0000 mg | ORAL_TABLET | Freq: Two times a day (BID) | ORAL | Status: DC

## 2013-10-08 DIAGNOSIS — L570 Actinic keratosis: Secondary | ICD-10-CM | POA: Diagnosis not present

## 2013-10-15 ENCOUNTER — Telehealth: Payer: Self-pay | Admitting: Pulmonary Disease

## 2013-10-15 NOTE — Telephone Encounter (Signed)
Looking in pt chart I show we sent updated RX 08/28/13 to [facility]. Per pt they never received this. I advised pt will call [facility]. I was advised by [facility] they will have to call pt insurance so they can get new RX overrided since pt last picked up RX in November for 90 day supply of 1/2 tab BID. They will call pt once ready. Nothing further needed

## 2013-11-14 ENCOUNTER — Other Ambulatory Visit: Payer: Self-pay | Admitting: Pulmonary Disease

## 2013-12-26 ENCOUNTER — Other Ambulatory Visit: Payer: Self-pay | Admitting: Pulmonary Disease

## 2013-12-26 DIAGNOSIS — I10 Essential (primary) hypertension: Secondary | ICD-10-CM

## 2013-12-26 DIAGNOSIS — F17200 Nicotine dependence, unspecified, uncomplicated: Secondary | ICD-10-CM

## 2013-12-26 DIAGNOSIS — E119 Type 2 diabetes mellitus without complications: Secondary | ICD-10-CM

## 2013-12-31 DIAGNOSIS — H259 Unspecified age-related cataract: Secondary | ICD-10-CM | POA: Diagnosis not present

## 2013-12-31 DIAGNOSIS — H35379 Puckering of macula, unspecified eye: Secondary | ICD-10-CM | POA: Diagnosis not present

## 2013-12-31 DIAGNOSIS — E119 Type 2 diabetes mellitus without complications: Secondary | ICD-10-CM | POA: Diagnosis not present

## 2013-12-31 DIAGNOSIS — H52209 Unspecified astigmatism, unspecified eye: Secondary | ICD-10-CM | POA: Diagnosis not present

## 2013-12-31 LAB — HM DIABETES EYE EXAM

## 2014-02-04 ENCOUNTER — Ambulatory Visit: Payer: Medicare Other | Admitting: Internal Medicine

## 2014-02-11 ENCOUNTER — Other Ambulatory Visit: Payer: Self-pay | Admitting: Pulmonary Disease

## 2014-02-13 ENCOUNTER — Ambulatory Visit: Payer: Medicare Other | Admitting: Pulmonary Disease

## 2014-02-20 ENCOUNTER — Other Ambulatory Visit: Payer: Self-pay | Admitting: Pulmonary Disease

## 2014-03-12 ENCOUNTER — Ambulatory Visit (INDEPENDENT_AMBULATORY_CARE_PROVIDER_SITE_OTHER): Payer: Medicare Other | Admitting: Internal Medicine

## 2014-03-12 ENCOUNTER — Encounter: Payer: Self-pay | Admitting: Internal Medicine

## 2014-03-12 VITALS — BP 112/72 | HR 70 | Temp 98.4°F | Ht 71.0 in | Wt 175.0 lb

## 2014-03-12 DIAGNOSIS — E785 Hyperlipidemia, unspecified: Secondary | ICD-10-CM

## 2014-03-12 DIAGNOSIS — M545 Low back pain, unspecified: Secondary | ICD-10-CM

## 2014-03-12 DIAGNOSIS — F17200 Nicotine dependence, unspecified, uncomplicated: Secondary | ICD-10-CM | POA: Diagnosis not present

## 2014-03-12 DIAGNOSIS — E119 Type 2 diabetes mellitus without complications: Secondary | ICD-10-CM

## 2014-03-12 DIAGNOSIS — J449 Chronic obstructive pulmonary disease, unspecified: Secondary | ICD-10-CM

## 2014-03-12 DIAGNOSIS — K589 Irritable bowel syndrome without diarrhea: Secondary | ICD-10-CM

## 2014-03-12 DIAGNOSIS — J4489 Other specified chronic obstructive pulmonary disease: Secondary | ICD-10-CM

## 2014-03-12 DIAGNOSIS — Z87442 Personal history of urinary calculi: Secondary | ICD-10-CM

## 2014-03-12 LAB — HEMOGLOBIN A1C: HEMOGLOBIN A1C: 6.7 % — AB (ref 4.6–6.5)

## 2014-03-12 MED ORDER — SIMVASTATIN 20 MG PO TABS: ORAL_TABLET | ORAL | Status: DC

## 2014-03-12 MED ORDER — ACYCLOVIR 400 MG PO TABS: ORAL_TABLET | ORAL | Status: DC

## 2014-03-12 MED ORDER — SILDENAFIL CITRATE 100 MG PO TABS: 100.0000 mg | ORAL_TABLET | Freq: Every day | ORAL | Status: DC | PRN

## 2014-03-12 NOTE — Patient Instructions (Addendum)
It is important that you exercise regularly, at least 20 minutes 3 to 4 times per week.  If you develop chest pain or shortness of breath seek  medical attention.  Smoking tobacco is very bad for your health. You should stop smoking immediately.  Return in 6 months for follow-up for CPX  Decrease acyclovir  To  400 mg daily

## 2014-03-12 NOTE — Progress Notes (Signed)
Subjective:    Patient ID: [name], male    DOB: [birth year], 73 y.o.   MRN: [id number]  HPI  73 year old patient, formerly followed by Dr. Lenna Gilford, who is seen today to establish with our practice.  He has history of type 2 diabetes, which has been controlled with metformin therapy only.  He is followed by urology due to nephrolithiasis.  He is on diuretic therapy for prevention of recurrent stone disease.  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  He has been on combination therapy for dyslipidemia. Additionally, he has a history of mild COPD, and rare tobacco use.  He has history also of gastroesophageal reflux disease and IBS which has been fairly stable. He had multiple back operations and has been followed by Dr. Trenton Gammon.  Past Medical History  Diagnosis Date  . Cigarette smoker   . Hyperlipidemia   . DM (diabetes mellitus)   . Hiatal hernia   . Diverticulosis of colon   . Colon polyp   . Hemorrhoids   . History of renal calculi   . History of BPH   . Neck pain   . Lumbar back pain   . Gastritis   . GERD (gastroesophageal reflux disease)   . IBS (irritable bowel syndrome)   . Shortness of breath   . Chronic kidney disease     stone  . Bronchitis     hx of  . COPD (chronic obstructive pulmonary disease)     Pt reports does not have history of COPD  . HTN (hypertension)     pt states med is for kidney stones and not high bp.     History   Social History  . Marital Status: Married    Spouse Name: N/A    Number of Children: 2  . Years of Education: N/A   Occupational History  . retired from [employer]  . Smoking status: Current Some Day Smoker -- 0.25 packs/day for 35 years  . Smokeless tobacco: Never Used     Comment: 1-3 cigarettes daily when golfing----Counseling given in exam room to quit smoking   . Alcohol Use: 1.5 oz/week    3 drink(s) per week     Comment: social  . Drug Use: No    . Sexual Activity: Not on file   Other Topics Concern  . Not on file   Social History Narrative  . No narrative on file    Past Surgical History  Procedure Laterality Date  . Cervical spine surgery  1986, 1995  . Uvulopalatopharyngoplasty  2001    Dr Wilburn Cornelia  . Cystoscopy  11/03    TURP and stone removal - Dr Amalia Hailey  . Dental implants  2007  . Lumbar microdiscectomy  12/07    L3-4 by Dr Annette Stable  . Upper gastrointestinal endoscopy  09/07/2010    erosive gastritis, GERD  . Colonoscopy w/ biopsies and polypectomy  05/06/2009    adenomous polyp, diverticulosis, external hemorrhoids  . Back surgery      x 3  . Rotator cuff repair      left shoulder  . Lithotripsy      x 3  . Anterior lat lumbar fusion  08/14/2012    Procedure: ANTERIOR LATERAL LUMBAR FUSION 1 LEVEL;  Surgeon: Charlie Pitter, MD;  Location: [facility];  Service: Neurosurgery;  Laterality: Left;  Left lumbar three-four extreme lumbar interbody fusion with  percutaneous pedicle screws   . Lumbar percutaneous pedicle screw 1 level  08/14/2012    Procedure: LUMBAR PERCUTANEOUS PEDICLE SCREW 1 LEVEL;  Surgeon: Charlie Pitter, MD;  Location: [facility];  Service: Neurosurgery;  Laterality: Left;  Left lumbar three-four extreme lumbar interbody fusion with percutaneous pedicle screws     Family History  Problem Relation Age of Onset  . Diabetes Father   . Colon cancer Mother     ? she had met dis and chemoRx  . Esophageal cancer Neg Hx   . Rectal cancer Neg Hx   . Stomach cancer Neg Hx     No Known Allergies  Current Outpatient Prescriptions on File Prior to Visit  Medication Sig Dispense Refill  . acyclovir (ZOVIRAX) 400 MG tablet TAKE 1 TABLET BY MOUTH TWICE DAILY  180 tablet  0  . aspirin 81 MG tablet Take 81 mg by mouth daily.        . metFORMIN (GLUCOPHAGE) 500 MG tablet Take 1 tablet (500 mg total) by mouth 2 (two) times daily with a meal.  180 tablet  3  . sildenafil (VIAGRA) 100 MG tablet Take 1 tablet  (100 mg total) by mouth daily as needed for erectile dysfunction.  10 tablet  5  . simvastatin (ZOCOR) 20 MG tablet TAKE 1 TABLET BY MOUTH AT BEDTIME.  90 tablet  0   No current facility-administered medications on file prior to visit.    BP 112/72  Pulse 70  Temp(Src) 98.4 F (36.9 C) (Oral)  Ht $R'5\' 11"'In$  (1.803 m)  Wt 175 lb (79.379 kg)  BMI 24.42 kg/m2  SpO2 96%       Review of Systems  Constitutional: Negative for fever, chills, appetite change and fatigue.  HENT: Negative for congestion, dental problem, ear pain, hearing loss, sore throat, tinnitus, trouble swallowing and voice change.   Eyes: Negative for pain, discharge and visual disturbance.  Respiratory: Negative for cough, chest tightness, wheezing and stridor.   Cardiovascular: Negative for chest pain, palpitations and leg swelling.  Gastrointestinal: Negative for nausea, vomiting, abdominal pain, diarrhea, constipation, blood in stool and abdominal distention.  Genitourinary: Negative for urgency, hematuria, flank pain, discharge, difficulty urinating and genital sores.  Musculoskeletal: Positive for arthralgias and back pain. Negative for gait problem, joint swelling, myalgias and neck stiffness.  Skin: Negative for rash.  Neurological: Negative for dizziness, syncope, speech difficulty, weakness, numbness and headaches.  Hematological: Negative for adenopathy. Does not bruise/bleed easily.  Psychiatric/Behavioral: Negative for behavioral problems and dysphoric mood. The patient is not nervous/anxious.        Objective:   Physical Exam  Constitutional: He is oriented to person, place, and time. He appears well-developed.  Tanned, healthy-appearing Blood pressure low normal  HENT:  Head: Normocephalic.  Right Ear: External ear normal.  Left Ear: External ear normal.  Eyes: Conjunctivae and EOM are normal.  Neck: Normal range of motion.  Cardiovascular: Normal rate, normal heart sounds and intact distal pulses.    Pulmonary/Chest: Breath sounds normal.  Abdominal: Bowel sounds are normal.  Musculoskeletal: Normal range of motion. He exhibits no edema and no tenderness.  Neurological: He is alert and oriented to person, place, and time.  Psychiatric: He has a normal mood and affect. His behavior is normal.          Assessment & Plan:   Diabetes mellitus.  We'll check a hemoglobin A1c Nephrolithiasis.  Followup urology COPD.  Total smoking cessation encouraged DJD Dyslipidemia.  We'll give  a trial off  Gemfibrozil Total smoking cessation encouraged History of herpes labialis.  We'll challenge on a lower dose of acyclovir  CPX 6 months

## 2014-03-12 NOTE — Progress Notes (Signed)
Pre visit review using our clinic review tool, if applicable. No additional management support is needed unless otherwise documented below in the visit note. 

## 2014-04-15 DIAGNOSIS — L821 Other seborrheic keratosis: Secondary | ICD-10-CM | POA: Diagnosis not present

## 2014-04-15 DIAGNOSIS — D239 Other benign neoplasm of skin, unspecified: Secondary | ICD-10-CM | POA: Diagnosis not present

## 2014-04-15 DIAGNOSIS — L570 Actinic keratosis: Secondary | ICD-10-CM | POA: Diagnosis not present

## 2014-05-31 ENCOUNTER — Encounter: Payer: Self-pay | Admitting: Pulmonary Disease

## 2014-06-27 DIAGNOSIS — N200 Calculus of kidney: Secondary | ICD-10-CM | POA: Diagnosis not present

## 2014-06-28 ENCOUNTER — Encounter: Payer: Self-pay | Admitting: Internal Medicine

## 2014-07-15 ENCOUNTER — Encounter: Payer: Self-pay | Admitting: Internal Medicine

## 2014-07-30 ENCOUNTER — Telehealth: Payer: Self-pay | Admitting: Internal Medicine

## 2014-07-30 NOTE — Telephone Encounter (Signed)
Spoke to pt, told him acute bronchitis symptoms for less than 10 days are generally not helped by antibiotics. Take over-the-counter expectorants and cough medications such as Mucinex DM. Call if there is no improvement in 5 to 7 days or if you develop worsening cough, fever, or new symptoms, such as shortness of breath or chest pain. Take Aleve 200 mg twice daily for pain or swelling per Dr. Raliegh Ip. Pt verbalized understanding.

## 2014-07-30 NOTE — Telephone Encounter (Signed)
Please advise 

## 2014-07-30 NOTE — Telephone Encounter (Signed)
Pt called to say he has a cough,sore throat would like to know if a zpac can be called in for him   Pharmacy ; [place] [address]

## 2014-07-30 NOTE — Telephone Encounter (Signed)
Acute bronchitis symptoms for less than 10 days are generally not helped by antibiotics.  Take over-the-counter expectorants and cough medications such as  Mucinex DM.  Call if there is no improvement in 5 to 7 days or if  you develop worsening cough, fever, or new symptoms, such as shortness of breath or chest pain.  Take Aleve 200 mg twice daily for pain or swelling

## 2014-08-02 ENCOUNTER — Other Ambulatory Visit: Payer: Self-pay

## 2014-08-19 DIAGNOSIS — Z23 Encounter for immunization: Secondary | ICD-10-CM | POA: Diagnosis not present

## 2014-10-21 ENCOUNTER — Other Ambulatory Visit: Payer: Self-pay | Admitting: Pulmonary Disease

## 2014-10-23 ENCOUNTER — Other Ambulatory Visit: Payer: Self-pay | Admitting: Internal Medicine

## 2015-01-02 DIAGNOSIS — H01001 Unspecified blepharitis right upper eyelid: Secondary | ICD-10-CM | POA: Diagnosis not present

## 2015-01-02 DIAGNOSIS — E119 Type 2 diabetes mellitus without complications: Secondary | ICD-10-CM | POA: Diagnosis not present

## 2015-01-02 DIAGNOSIS — H2513 Age-related nuclear cataract, bilateral: Secondary | ICD-10-CM | POA: Diagnosis not present

## 2015-01-02 DIAGNOSIS — H01004 Unspecified blepharitis left upper eyelid: Secondary | ICD-10-CM | POA: Diagnosis not present

## 2015-01-02 LAB — HM DIABETES EYE EXAM

## 2015-01-27 ENCOUNTER — Other Ambulatory Visit: Payer: Self-pay | Admitting: Internal Medicine

## 2015-02-12 ENCOUNTER — Encounter: Payer: Self-pay | Admitting: Internal Medicine

## 2015-02-12 DIAGNOSIS — M4727 Other spondylosis with radiculopathy, lumbosacral region: Secondary | ICD-10-CM | POA: Diagnosis not present

## 2015-03-19 DIAGNOSIS — M545 Low back pain: Secondary | ICD-10-CM | POA: Diagnosis not present

## 2015-03-19 DIAGNOSIS — M4727 Other spondylosis with radiculopathy, lumbosacral region: Secondary | ICD-10-CM | POA: Diagnosis not present

## 2015-04-14 ENCOUNTER — Other Ambulatory Visit: Payer: Self-pay

## 2015-04-22 ENCOUNTER — Other Ambulatory Visit: Payer: Self-pay | Admitting: Internal Medicine

## 2015-04-23 DIAGNOSIS — M545 Low back pain: Secondary | ICD-10-CM | POA: Diagnosis not present

## 2015-04-23 DIAGNOSIS — M4727 Other spondylosis with radiculopathy, lumbosacral region: Secondary | ICD-10-CM | POA: Diagnosis not present

## 2015-05-08 DIAGNOSIS — L821 Other seborrheic keratosis: Secondary | ICD-10-CM | POA: Diagnosis not present

## 2015-05-08 DIAGNOSIS — D485 Neoplasm of uncertain behavior of skin: Secondary | ICD-10-CM | POA: Diagnosis not present

## 2015-05-08 DIAGNOSIS — Z86018 Personal history of other benign neoplasm: Secondary | ICD-10-CM | POA: Diagnosis not present

## 2015-05-08 DIAGNOSIS — D225 Melanocytic nevi of trunk: Secondary | ICD-10-CM | POA: Diagnosis not present

## 2015-05-08 DIAGNOSIS — L570 Actinic keratosis: Secondary | ICD-10-CM | POA: Diagnosis not present

## 2015-05-23 ENCOUNTER — Other Ambulatory Visit: Payer: Self-pay | Admitting: Internal Medicine

## 2015-05-30 ENCOUNTER — Other Ambulatory Visit (INDEPENDENT_AMBULATORY_CARE_PROVIDER_SITE_OTHER): Payer: Medicare Other

## 2015-05-30 DIAGNOSIS — Z0000 Encounter for general adult medical examination without abnormal findings: Secondary | ICD-10-CM

## 2015-05-30 DIAGNOSIS — Z125 Encounter for screening for malignant neoplasm of prostate: Secondary | ICD-10-CM

## 2015-05-30 DIAGNOSIS — E785 Hyperlipidemia, unspecified: Secondary | ICD-10-CM

## 2015-05-30 DIAGNOSIS — I10 Essential (primary) hypertension: Secondary | ICD-10-CM

## 2015-05-30 LAB — LIPID PANEL
CHOL/HDL RATIO: 3
Cholesterol: 141 mg/dL (ref 0–200)
HDL: 42.5 mg/dL (ref >39.00)
LDL CALC: 81 mg/dL (ref 0–99)
NONHDL: 98.81
TRIGLYCERIDES: 89 mg/dL (ref 0.0–149.0)
VLDL: 17.8 mg/dL (ref 0.0–40.0)

## 2015-05-30 LAB — POCT URINALYSIS DIPSTICK
Bilirubin, UA: NEGATIVE
Glucose, UA: NEGATIVE
Ketones, UA: NEGATIVE
Leukocytes, UA: NEGATIVE
NITRITE UA: NEGATIVE
PH UA: 5.5
Spec Grav, UA: >=1.03
UROBILINOGEN UA: 0.2

## 2015-05-30 LAB — BASIC METABOLIC PANEL
BUN: 23 mg/dL (ref 6–23)
CALCIUM: 9.9 mg/dL (ref 8.4–10.5)
CO2: 29 mEq/L (ref 19–32)
Chloride: 104 mEq/L (ref 96–112)
Creatinine, Ser: 1.11 mg/dL (ref 0.40–1.50)
GFR: 69.27 mL/min (ref >60.00)
Glucose, Bld: 127 mg/dL — ABNORMAL HIGH (ref 70–99)
Potassium: 4.8 mEq/L (ref 3.5–5.1)
Sodium: 140 mEq/L (ref 135–145)

## 2015-05-30 LAB — CBC WITH DIFFERENTIAL/PLATELET
Basophils Absolute: 0 10*3/uL (ref 0.0–0.1)
Basophils Relative: 0.5 % (ref 0.0–3.0)
Eosinophils Absolute: 0.3 10*3/uL (ref 0.0–0.7)
Eosinophils Relative: 5.1 % — ABNORMAL HIGH (ref 0.0–5.0)
HCT: 45.2 % (ref 39.0–52.0)
HEMOGLOBIN: 15.2 g/dL (ref 13.0–17.0)
LYMPHS ABS: 1.9 10*3/uL (ref 0.7–4.0)
LYMPHS PCT: 29.5 % (ref 12.0–46.0)
MCHC: 33.6 g/dL (ref 30.0–36.0)
MCV: 93.6 fl (ref 78.0–100.0)
MONOS PCT: 8.2 % (ref 3.0–12.0)
Monocytes Absolute: 0.5 10*3/uL (ref 0.1–1.0)
Neutro Abs: 3.6 10*3/uL (ref 1.4–7.7)
Neutrophils Relative %: 56.7 % (ref 43.0–77.0)
PLATELETS: 264 10*3/uL (ref 150.0–400.0)
RBC: 4.83 Mil/uL (ref 4.22–5.81)
RDW: 13.1 % (ref 11.5–15.5)
WBC: 6.4 10*3/uL (ref 4.0–10.5)

## 2015-05-30 LAB — HEPATIC FUNCTION PANEL
ALBUMIN: 4.6 g/dL (ref 3.5–5.2)
ALK PHOS: 86 U/L (ref 39–117)
ALT: 15 U/L (ref 0–53)
AST: 15 U/L (ref 0–37)
Bilirubin, Direct: 0.1 mg/dL (ref 0.0–0.3)
TOTAL PROTEIN: 6.7 g/dL (ref 6.0–8.3)
Total Bilirubin: 0.6 mg/dL (ref 0.2–1.2)

## 2015-05-30 LAB — TSH: TSH: 3.12 u[IU]/mL (ref 0.35–4.50)

## 2015-05-30 LAB — MICROALBUMIN / CREATININE URINE RATIO
Creatinine,U: 411.4 mg/dL
MICROALB UR: 7.8 mg/dL — AB (ref 0.0–1.9)
Microalb Creat Ratio: 1.9 mg/g (ref 0.0–30.0)

## 2015-05-30 LAB — HEMOGLOBIN A1C: HEMOGLOBIN A1C: 6.4 % (ref 4.6–6.5)

## 2015-05-30 LAB — PSA: PSA: 0.75 ng/mL (ref 0.10–4.00)

## 2015-06-06 ENCOUNTER — Encounter: Payer: Self-pay | Admitting: Internal Medicine

## 2015-06-06 ENCOUNTER — Ambulatory Visit (INDEPENDENT_AMBULATORY_CARE_PROVIDER_SITE_OTHER): Payer: Medicare Other | Admitting: Internal Medicine

## 2015-06-06 VITALS — BP 120/68 | HR 59 | Temp 97.8°F | Resp 20 | Ht 70.5 in | Wt 168.0 lb

## 2015-06-06 DIAGNOSIS — E119 Type 2 diabetes mellitus without complications: Secondary | ICD-10-CM

## 2015-06-06 DIAGNOSIS — I10 Essential (primary) hypertension: Secondary | ICD-10-CM

## 2015-06-06 DIAGNOSIS — Z0000 Encounter for general adult medical examination without abnormal findings: Secondary | ICD-10-CM

## 2015-06-06 DIAGNOSIS — Z86010 Personal history of colonic polyps: Secondary | ICD-10-CM

## 2015-06-06 DIAGNOSIS — E785 Hyperlipidemia, unspecified: Secondary | ICD-10-CM

## 2015-06-06 DIAGNOSIS — Z87442 Personal history of urinary calculi: Secondary | ICD-10-CM

## 2015-06-06 DIAGNOSIS — Z23 Encounter for immunization: Secondary | ICD-10-CM | POA: Diagnosis not present

## 2015-06-06 MED ORDER — ACYCLOVIR 400 MG PO TABS: ORAL_TABLET | ORAL | Status: DC

## 2015-06-06 MED ORDER — METFORMIN HCL 500 MG PO TABS: ORAL_TABLET | ORAL | Status: DC

## 2015-06-06 MED ORDER — SILDENAFIL CITRATE 100 MG PO TABS: ORAL_TABLET | ORAL | Status: DC

## 2015-06-06 MED ORDER — SIMVASTATIN 20 MG PO TABS: 20.0000 mg | ORAL_TABLET | Freq: Every day | ORAL | Status: DC

## 2015-06-06 NOTE — Addendum Note (Signed)
Addended by: Marian Sorrow on: 06/06/2015 12:02 PM   Modules accepted: Orders

## 2015-06-06 NOTE — Progress Notes (Signed)
Pre visit review using our clinic review tool, if applicable. No additional management support is needed unless otherwise documented below in the visit note. 

## 2015-06-06 NOTE — Progress Notes (Signed)
Subjective:    Patient ID: [name], male    DOB: 02-24-1941, 74 y.o.   MRN: [id number]  HPI   74 year old patient, formerly followed by Dr. Lenna Gilford, who is seen today for an annual exam  He has history of type 2 diabetes, which has been controlled with metformin therapy only.  He is followed by urology due to nephrolithiasis.  He is on diuretic therapy for prevention of recurrent stone disease.  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  He has been on combination therapy for dyslipidemia. Additionally, he has a history of mild COPD, and rare tobacco use.  He has history also of gastroesophageal reflux disease and IBS which has been fairly stable. He had multiple back operations and has been followed by Dr. Trenton Gammon.  Patient has a history of colonic polyps.  Colonoscopy June 2014  Past Medical History  Diagnosis Date  . Cigarette smoker   . Hyperlipidemia   . DM (diabetes mellitus)   . Hiatal hernia   . Diverticulosis of colon   . Colon polyp   . Hemorrhoids   . History of renal calculi   . History of BPH   . Neck pain   . Lumbar back pain   . Gastritis   . GERD (gastroesophageal reflux disease)   . IBS (irritable bowel syndrome)   . Shortness of breath   . Chronic kidney disease     stone  . Bronchitis     hx of  . COPD (chronic obstructive pulmonary disease)     Pt reports does not have history of COPD  . HTN (hypertension)     pt states med is for kidney stones and not high bp.     Social History   Social History  . Marital Status: Married    Spouse Name: N/A  . Number of Children: 2  . Years of Education: N/A   Occupational History  . retired from [employer]  . Smoking status: Current Some Day Smoker -- 0.25 packs/day for 35 years  . Smokeless tobacco: Never Used     Comment: 1-3 cigarettes daily when golfing----Counseling given in exam room to quit smoking   . Alcohol Use: 1.5 oz/week      3 drink(s) per week     Comment: social  . Drug Use: No  . Sexual Activity: Not on file   Other Topics Concern  . Not on file   Social History Narrative  . No narrative on file    Past Surgical History  Procedure Laterality Date  . Cervical spine surgery  1986, 1995  . Uvulopalatopharyngoplasty  2001    Dr Wilburn Cornelia  . Cystoscopy  11/03    TURP and stone removal - Dr Amalia Hailey  . Dental implants  2007  . Lumbar microdiscectomy  12/07    L3-4 by Dr Annette Stable  . Upper gastrointestinal endoscopy  09/07/2010    erosive gastritis, GERD  . Colonoscopy w/ biopsies and polypectomy  05/06/2009    adenomous polyp, diverticulosis, external hemorrhoids  . Back surgery      x 3  . Rotator cuff repair      left shoulder  . Lithotripsy      x 3  . Anterior lat lumbar fusion  08/14/2012    Procedure: ANTERIOR LATERAL LUMBAR FUSION 1 LEVEL;  Surgeon: Charlie Pitter, MD;  Location: [facility];  Service: Neurosurgery;  Laterality: Left;  Left lumbar three-four extreme lumbar interbody fusion with percutaneous pedicle screws   . Lumbar percutaneous pedicle screw 1 level  08/14/2012    Procedure: LUMBAR PERCUTANEOUS PEDICLE SCREW 1 LEVEL;  Surgeon: Charlie Pitter, MD;  Location: [facility];  Service: Neurosurgery;  Laterality: Left;  Left lumbar three-four extreme lumbar interbody fusion with percutaneous pedicle screws     Family History  Problem Relation Age of Onset  . Diabetes Father   . Colon cancer Mother     ? she had met dis and chemoRx  . Esophageal cancer Neg Hx   . Rectal cancer Neg Hx   . Stomach cancer Neg Hx     No Known Allergies  Current Outpatient Prescriptions on File Prior to Visit  Medication Sig Dispense Refill  . acyclovir (ZOVIRAX) 400 MG tablet TAKE 1 TABLET BY MOUTH TWICE DAILY 180 tablet 6  . aspirin 81 MG tablet Take 81 mg by mouth daily.      . hydrochlorothiazide (HYDRODIURIL) 25 MG tablet Take 12.5 mg by mouth daily.     . metFORMIN (GLUCOPHAGE) 500 MG  tablet TAKE 1 TABLET BY MOUTH TWICE DAILY WITH A MEAL. (Patient taking differently: TAKE 1 TABLET BY MOUTH  DAILY WITH A MEAL.) 180 tablet 0  . potassium citrate (UROCIT-K) 10 MEQ (1080 MG) SR tablet     . simvastatin (ZOCOR) 20 MG tablet TAKE 1 TABLET BY MOUTH AT BEDTIME 30 tablet 0  . VIAGRA 100 MG tablet TAKE 1 TABLET BY MOUTH EVERY DAY AS NEEDED FOR ERECTILE DYSFUNCTION 10 tablet 0   No current facility-administered medications on file prior to visit.    1. Risk factors, based on past  M,S,F history.  Cardiac death risk factors include dyslipidemia, diabetes, hypertension and ongoing tobacco use  2.  Physical activities: Remains quite active.  Avid golfer  3.  Depression/mood: No history depression or mood disorder  4.  Hearing: No deficits  5.  ADL's: Independent in all aspects of daily living  6.  Fall risk: Low  7.  Home safety: No problems identified  8.  Height weight, and visual acuity; height and weight stable no change in visual acuity does have annual eye examinations  9.  Counseling: Total smoking cessation encouraged  10. Lab orders based on risk factors: Laboratory profile including hemoglobin A1c and urine for microalbumin.  All encouraged  11. Referral : Follow-up ophthalmology annually  12. Care plan: Continue heart healthy diet.  Active exercise regimen.  Will continue to track hemoglobin A1c's by annually   13. Cognitive assessment: Alert in order with normal affect.  No cognitive dysfunction   14. Screening: Patient provided with a written and personalized 5-10 year screening schedule in the AVS.  .  We'll continue to have annual clinical exams with screening lab.  Yearly eye examination encouraged.  The patient was followed periodically by urology.  Will continue colonoscopies at five-year intervals  15. Provider List Update: Includes primary care GI ophthalmology and urology         Review of Systems  Constitutional: Negative for fever, chills,  appetite change and fatigue.  HENT: Negative for congestion, dental problem, ear pain, hearing loss, sore throat, tinnitus, trouble swallowing and voice change.   Eyes: Negative for pain, discharge and visual disturbance.  Respiratory: Negative for cough, chest tightness, wheezing and stridor.   Cardiovascular: Negative for chest pain, palpitations and leg swelling.  Gastrointestinal: Negative for nausea, vomiting, abdominal pain, diarrhea, constipation, blood  in stool and abdominal distention.  Genitourinary: Negative for urgency, hematuria, flank pain, discharge, difficulty urinating and genital sores.  Musculoskeletal: Positive for back pain and arthralgias. Negative for myalgias, joint swelling, gait problem and neck stiffness.  Skin: Negative for rash.  Neurological: Negative for dizziness, syncope, speech difficulty, weakness, numbness and headaches.  Hematological: Negative for adenopathy. Does not bruise/bleed easily.  Psychiatric/Behavioral: Negative for behavioral problems and dysphoric mood. The patient is not nervous/anxious.        Objective:   Physical Exam  Constitutional: He is oriented to person, place, and time. He appears well-developed.  Tanned, healthy-appearing Blood pressure low normal  HENT:  Head: Normocephalic.  Right Ear: External ear normal.  Left Ear: External ear normal.  Eyes: Conjunctivae and EOM are normal.  Neck: Normal range of motion.  Cardiovascular: Normal rate, normal heart sounds and intact distal pulses.   Pulmonary/Chest: Breath sounds normal.  Abdominal: Bowel sounds are normal.  Musculoskeletal: Normal range of motion. He exhibits no edema or tenderness.  Neurological: He is alert and oriented to person, place, and time.  Psychiatric: He has a normal mood and affect. His behavior is normal.          Assessment & Plan:   Diabetes mellitus.  We'll review hemoglobin A1c, lipid profile and urine for microalbumin Nephrolithiasis.   Followup urology COPD.  Total smoking cessation encouraged DJD Dyslipidemia.  Continue statin therapy Total smoking cessation encouraged  History colonic polyps.  Will continue colonoscopies at five-year intervals  CPX 6 months

## 2015-06-06 NOTE — Patient Instructions (Signed)
Limit your sodium (Salt) intake  Return in 6 months for follow-up  Please see your eye doctor yearly to check for diabetic eye damage  Health Maintenance A healthy lifestyle and preventative care can promote health and wellness.  Maintain regular health, dental, and eye exams.  Eat a healthy diet. Foods like vegetables, fruits, whole grains, low-fat dairy products, and lean protein foods contain the nutrients you need and are low in calories. Decrease your intake of foods high in solid fats, added sugars, and salt. Get information about a proper diet from your health care provider, if necessary.  Regular physical exercise is one of the most important things you can do for your health. Most adults should get at least 150 minutes of moderate-intensity exercise (any activity that increases your heart rate and causes you to sweat) each week. In addition, most adults need muscle-strengthening exercises on 2 or more days a week.   Maintain a healthy weight. The body mass index (BMI) is a screening tool to identify possible weight problems. It provides an estimate of body fat based on height and weight. Your health care provider can find your BMI and can help you achieve or maintain a healthy weight. For males 20 years and older:  A BMI below 18.5 is considered underweight.  A BMI of 18.5 to 24.9 is normal.  A BMI of 25 to 29.9 is considered overweight.  A BMI of 30 and above is considered obese.  Maintain normal blood lipids and cholesterol by exercising and minimizing your intake of saturated fat. Eat a balanced diet with plenty of fruits and vegetables. Blood tests for lipids and cholesterol should begin at age 34 and be repeated every 5 years. If your lipid or cholesterol levels are high, you are over age 82, or you are at high risk for heart disease, you may need your cholesterol levels checked more frequently.Ongoing high lipid and cholesterol levels should be treated with medicines if diet  and exercise are not working.  If you smoke, find out from your health care provider how to quit. If you do not use tobacco, do not start.  Lung cancer screening is recommended for adults aged 44-80 years who are at high risk for developing lung cancer because of a history of smoking. A yearly low-dose CT scan of the lungs is recommended for people who have at least a 30-pack-year history of smoking and are current smokers or have quit within the past 15 years. A pack year of smoking is smoking an average of 1 pack of cigarettes a day for 1 year (for example, a 30-pack-year history of smoking could mean smoking 1 pack a day for 30 years or 2 packs a day for 15 years). Yearly screening should continue until the smoker has stopped smoking for at least 15 years. Yearly screening should be stopped for people who develop a health problem that would prevent them from having lung cancer treatment.  If you choose to drink alcohol, do not have more than 2 drinks per day. One drink is considered to be 12 oz (360 mL) of beer, 5 oz (150 mL) of wine, or 1.5 oz (45 mL) of liquor.  Avoid the use of street drugs. Do not share needles with anyone. Ask for help if you need support or instructions about stopping the use of drugs.  High blood pressure causes heart disease and increases the risk of stroke. Blood pressure should be checked at least every 1-2 years. Ongoing high blood pressure  should be treated with medicines if weight loss and exercise are not effective.  If you are 18-21 years old, ask your health care provider if you should take aspirin to prevent heart disease.  Diabetes screening involves taking a blood sample to check your fasting blood sugar level. This should be done once every 3 years after age 29 if you are at a normal weight and without risk factors for diabetes. Testing should be considered at a younger age or be carried out more frequently if you are overweight and have at least 1 risk factor  for diabetes.  Colorectal cancer can be detected and often prevented. Most routine colorectal cancer screening begins at the age of 68 and continues through age 33. However, your health care provider may recommend screening at an earlier age if you have risk factors for colon cancer. On a yearly basis, your health care provider may provide home test kits to check for hidden blood in the stool. A small camera at the end of a tube may be used to directly examine the colon (sigmoidoscopy or colonoscopy) to detect the earliest forms of colorectal cancer. Talk to your health care provider about this at age 56 when routine screening begins. A direct exam of the colon should be repeated every 5-10 years through age 25, unless early forms of precancerous polyps or small growths are found.  People who are at an increased risk for hepatitis B should be screened for this virus. You are considered at high risk for hepatitis B if:  You were born in a country where hepatitis B occurs often. Talk with your health care provider about which countries are considered high risk.  Your parents were born in a high-risk country and you have not received a shot to protect against hepatitis B (hepatitis B vaccine).  You have HIV or AIDS.  You use needles to inject street drugs.  You live with, or have sex with, someone who has hepatitis B.  You are a man who has sex with other men (MSM).  You get hemodialysis treatment.  You take certain medicines for conditions like cancer, organ transplantation, and autoimmune conditions.  Hepatitis C blood testing is recommended for all people born from [birth year] through 1963 and any individual with known risk factors for hepatitis C.  Healthy men should no longer receive prostate-specific antigen (PSA) blood tests as part of routine cancer screening. Talk to your health care provider about prostate cancer screening.  Testicular cancer screening is not recommended for adolescents or  adult males who have no symptoms. Screening includes self-exam, a health care provider exam, and other screening tests. Consult with your health care provider about any symptoms you have or any concerns you have about testicular cancer.  Practice safe sex. Use condoms and avoid high-risk sexual practices to reduce the spread of sexually transmitted infections (STIs).  You should be screened for STIs, including gonorrhea and chlamydia if:  You are sexually active and are younger than 24 years.  You are older than 24 years, and your health care provider tells you that you are at risk for this type of infection.  Your sexual activity has changed since you were last screened, and you are at an increased risk for chlamydia or gonorrhea. Ask your health care provider if you are at risk.  If you are at risk of being infected with HIV, it is recommended that you take a prescription medicine daily to prevent HIV infection. This is called pre-exposure prophylaxis (  PrEP). You are considered at risk if:  You are a man who has sex with other men (MSM).  You are a heterosexual man who is sexually active with multiple partners.  You take drugs by injection.  You are sexually active with a partner who has HIV.  Talk with your health care provider about whether you are at high risk of being infected with HIV. If you choose to begin PrEP, you should first be tested for HIV. You should then be tested every 3 months for as long as you are taking PrEP.  Use sunscreen. Apply sunscreen liberally and repeatedly throughout the day. You should seek shade when your shadow is shorter than you. Protect yourself by wearing long sleeves, pants, a wide-brimmed hat, and sunglasses year round whenever you are outdoors.  Tell your health care provider of new moles or changes in moles, especially if there is a change in shape or color. Also, tell your health care provider if a mole is larger than the size of a pencil  eraser.  A one-time screening for abdominal aortic aneurysm (AAA) and surgical repair of large AAAs by ultrasound is recommended for men aged 65-75 years who are current or former smokers.  Stay current with your vaccines (immunizations). Document Released: 04/01/2008 Document Revised: 10/09/2013 Document Reviewed: 03/01/2011 [facility] Patient Information 2015 [place], [place]. This information is not intended to replace advice given to you by your health care provider. Make sure you discuss any questions you have with your health care provider.

## 2015-07-01 DIAGNOSIS — N200 Calculus of kidney: Secondary | ICD-10-CM | POA: Diagnosis not present

## 2015-07-18 DIAGNOSIS — Z23 Encounter for immunization: Secondary | ICD-10-CM | POA: Diagnosis not present

## 2015-09-17 ENCOUNTER — Telehealth: Payer: Self-pay | Admitting: Internal Medicine

## 2015-09-17 MED ORDER — AZITHROMYCIN 250 MG PO TABS: ORAL_TABLET | ORAL | Status: DC

## 2015-09-17 NOTE — Telephone Encounter (Signed)
[name] can you please call in zpak for this patient

## 2015-09-17 NOTE — Telephone Encounter (Signed)
Ok for z pack

## 2015-09-17 NOTE — Telephone Encounter (Signed)
Rx was sent  

## 2015-09-17 NOTE — Telephone Encounter (Signed)
Pt would like zpak call into [facility]. Pt has sinus issues ,st and chest congestion and cough. Pt is aware md not in office this afternoon.

## 2015-09-17 NOTE — Telephone Encounter (Signed)
Pt is aware.  

## 2015-09-17 NOTE — Telephone Encounter (Signed)
Please see message and advise 

## 2015-11-18 DIAGNOSIS — M4727 Other spondylosis with radiculopathy, lumbosacral region: Secondary | ICD-10-CM | POA: Diagnosis not present

## 2015-11-18 DIAGNOSIS — M545 Low back pain: Secondary | ICD-10-CM | POA: Diagnosis not present

## 2015-12-09 ENCOUNTER — Ambulatory Visit (INDEPENDENT_AMBULATORY_CARE_PROVIDER_SITE_OTHER): Payer: Medicare Other | Admitting: Internal Medicine

## 2015-12-09 ENCOUNTER — Encounter: Payer: Self-pay | Admitting: Internal Medicine

## 2015-12-09 VITALS — BP 130/70 | HR 79 | Temp 98.6°F | Resp 20 | Ht 70.5 in | Wt 173.0 lb

## 2015-12-09 DIAGNOSIS — I10 Essential (primary) hypertension: Secondary | ICD-10-CM

## 2015-12-09 DIAGNOSIS — F17200 Nicotine dependence, unspecified, uncomplicated: Secondary | ICD-10-CM

## 2015-12-09 DIAGNOSIS — E119 Type 2 diabetes mellitus without complications: Secondary | ICD-10-CM | POA: Diagnosis not present

## 2015-12-09 LAB — HEMOGLOBIN A1C: Hgb A1c MFr Bld: 7.2 % — ABNORMAL HIGH (ref 4.6–6.5)

## 2015-12-09 MED ORDER — SILDENAFIL CITRATE 100 MG PO TABS: ORAL_TABLET | ORAL | Status: DC

## 2015-12-09 MED ORDER — METFORMIN HCL 500 MG PO TABS: ORAL_TABLET | ORAL | Status: DC

## 2015-12-09 NOTE — Patient Instructions (Signed)
Limit your sodium (Salt) intake   Please check your hemoglobin A1c every 3-6  Months    It is important that you exercise regularly, at least 20 minutes 3 to 4 times per week.  If you develop chest pain or shortness of breath seek  medical attention.     

## 2015-12-09 NOTE — Progress Notes (Signed)
Pre visit review using our clinic review tool, if applicable. No additional management support is needed unless otherwise documented below in the visit note. 

## 2015-12-09 NOTE — Progress Notes (Signed)
Subjective:    Patient ID: [name], male    DOB: [birth year], 75 y.o.   MRN: [id number]  HPI 75 year old patient who is seen today for his biannual follow-up.  He has hypertension and type 2 diabetes.  No concerns or complaints.  Remains quite active.  Last hemoglobin A1c 6.4.  Past Medical History  Diagnosis Date  . Cigarette smoker   . Hyperlipidemia   . DM (diabetes mellitus) ([place])   . Hiatal hernia   . Diverticulosis of colon   . Colon polyp   . Hemorrhoids   . History of renal calculi   . History of BPH   . Neck pain   . Lumbar back pain   . Gastritis   . GERD (gastroesophageal reflux disease)   . IBS (irritable bowel syndrome)   . Shortness of breath   . Chronic kidney disease     stone  . Bronchitis     hx of  . COPD (chronic obstructive pulmonary disease) ([place])     Pt reports does not have history of COPD  . HTN (hypertension)     pt states med is for kidney stones and not high bp.     Social History   Social History  . Marital Status: Married    Spouse Name: N/A  . Number of Children: 2  . Years of Education: N/A   Occupational History  . retired from [employer] Topics  . Smoking status: Current Some Day Smoker -- 0.25 packs/day for 35 years  . Smokeless tobacco: Never Used     Comment: 1-3 cigarettes daily when golfing----Counseling given in exam room to quit smoking   . Alcohol Use: 1.5 oz/week    3 drink(s) per week     Comment: social  . Drug Use: No  . Sexual Activity: Not on file   Other Topics Concern  . Not on file   Social History Narrative    Past Surgical History  Procedure Laterality Date  . Cervical spine surgery  1986, 1995  . Uvulopalatopharyngoplasty  2001    Dr Wilburn Cornelia  . Cystoscopy  11/03    TURP and stone removal - Dr Amalia Hailey  . Dental implants  2007  . Lumbar microdiscectomy  12/07    L3-4 by Dr Annette Stable  . Upper gastrointestinal endoscopy  09/07/2010    erosive gastritis, GERD  .  Colonoscopy w/ biopsies and polypectomy  05/06/2009    adenomous polyp, diverticulosis, external hemorrhoids  . Back surgery      x 3  . Rotator cuff repair      left shoulder  . Lithotripsy      x 3  . Anterior lat lumbar fusion  08/14/2012    Procedure: ANTERIOR LATERAL LUMBAR FUSION 1 LEVEL;  Surgeon: Charlie Pitter, MD;  Location: [facility];  Service: Neurosurgery;  Laterality: Left;  Left lumbar three-four extreme lumbar interbody fusion with percutaneous pedicle screws   . Lumbar percutaneous pedicle screw 1 level  08/14/2012    Procedure: LUMBAR PERCUTANEOUS PEDICLE SCREW 1 LEVEL;  Surgeon: Charlie Pitter, MD;  Location: [facility];  Service: Neurosurgery;  Laterality: Left;  Left lumbar three-four extreme lumbar interbody fusion with percutaneous pedicle screws     Family History  Problem Relation Age of Onset  . Diabetes Father   . Colon cancer Mother     ? she had met dis and chemoRx  . Esophageal cancer Neg  Hx   . Rectal cancer Neg Hx   . Stomach cancer Neg Hx     No Known Allergies  Current Outpatient Prescriptions on File Prior to Visit  Medication Sig Dispense Refill  . acyclovir (ZOVIRAX) 400 MG tablet TAKE 1 TABLET BY MOUTH TWICE DAILY 180 tablet 3  . aspirin 81 MG tablet Take 81 mg by mouth daily.      . hydrochlorothiazide (HYDRODIURIL) 25 MG tablet Take 12.5 mg by mouth daily.     . metFORMIN (GLUCOPHAGE) 500 MG tablet TAKE 1 TABLET BY MOUTH  DAILY WITH A MEAL. 90 tablet 3  . potassium citrate (UROCIT-K) 10 MEQ (1080 MG) SR tablet     . sildenafil (VIAGRA) 100 MG tablet TAKE 1 TABLET BY MOUTH EVERY DAY AS NEEDED FOR ERECTILE DYSFUNCTION 10 tablet 3  . simvastatin (ZOCOR) 20 MG tablet Take 1 tablet (20 mg total) by mouth at bedtime. 90 tablet 3   No current facility-administered medications on file prior to visit.    BP 130/70 mmHg  Pulse 79  Temp(Src) 98.6 F (37 C) (Oral)  Resp 20  Ht 5' 10.5" (1.791 m)  Wt 173 lb (78.472 kg)  BMI 24.46 kg/m2  SpO2  98%      Review of Systems  Constitutional: Negative for fever, chills, appetite change and fatigue.  HENT: Negative for congestion, dental problem, ear pain, hearing loss, sore throat, tinnitus, trouble swallowing and voice change.   Eyes: Negative for pain, discharge and visual disturbance.  Respiratory: Negative for cough, chest tightness, wheezing and stridor.   Cardiovascular: Negative for chest pain, palpitations and leg swelling.  Gastrointestinal: Negative for nausea, vomiting, abdominal pain, diarrhea, constipation, blood in stool and abdominal distention.  Genitourinary: Negative for urgency, hematuria, flank pain, discharge, difficulty urinating and genital sores.  Musculoskeletal: Negative for myalgias, back pain, joint swelling, arthralgias, gait problem and neck stiffness.  Skin: Negative for rash.  Neurological: Negative for dizziness, syncope, speech difficulty, weakness, numbness and headaches.  Hematological: Negative for adenopathy. Does not bruise/bleed easily.  Psychiatric/Behavioral: Negative for behavioral problems and dysphoric mood. The patient is not nervous/anxious.        Objective:   Physical Exam  Constitutional: He is oriented to person, place, and time. He appears well-developed.  HENT:  Head: Normocephalic.  Right Ear: External ear normal.  Left Ear: External ear normal.  Eyes: Conjunctivae and EOM are normal.  Neck: Normal range of motion.  Cardiovascular: Normal rate and normal heart sounds.   Pulmonary/Chest: Breath sounds normal.  Abdominal: Bowel sounds are normal.  Musculoskeletal: Normal range of motion. He exhibits no edema or tenderness.  Neurological: He is alert and oriented to person, place, and time.  Psychiatric: He has a normal mood and affect. His behavior is normal.          Assessment & Plan:   Hypertension, well-controlled Diabetes mellitus.  Will check a hemoglobin A1c  No change in therapy CPX 6 months

## 2016-01-08 DIAGNOSIS — H25813 Combined forms of age-related cataract, bilateral: Secondary | ICD-10-CM | POA: Diagnosis not present

## 2016-01-08 DIAGNOSIS — H01001 Unspecified blepharitis right upper eyelid: Secondary | ICD-10-CM | POA: Diagnosis not present

## 2016-01-08 DIAGNOSIS — H524 Presbyopia: Secondary | ICD-10-CM | POA: Diagnosis not present

## 2016-01-08 DIAGNOSIS — E119 Type 2 diabetes mellitus without complications: Secondary | ICD-10-CM | POA: Diagnosis not present

## 2016-01-08 LAB — HM DIABETES EYE EXAM

## 2016-01-15 ENCOUNTER — Encounter: Payer: Self-pay | Admitting: Internal Medicine

## 2016-01-29 DIAGNOSIS — J069 Acute upper respiratory infection, unspecified: Secondary | ICD-10-CM | POA: Diagnosis not present

## 2016-01-29 DIAGNOSIS — J029 Acute pharyngitis, unspecified: Secondary | ICD-10-CM | POA: Diagnosis not present

## 2016-03-19 DIAGNOSIS — M545 Low back pain: Secondary | ICD-10-CM | POA: Diagnosis not present

## 2016-05-26 DIAGNOSIS — Z86018 Personal history of other benign neoplasm: Secondary | ICD-10-CM | POA: Diagnosis not present

## 2016-05-26 DIAGNOSIS — D225 Melanocytic nevi of trunk: Secondary | ICD-10-CM | POA: Diagnosis not present

## 2016-05-26 DIAGNOSIS — L304 Erythema intertrigo: Secondary | ICD-10-CM | POA: Diagnosis not present

## 2016-05-26 DIAGNOSIS — L570 Actinic keratosis: Secondary | ICD-10-CM | POA: Diagnosis not present

## 2016-05-26 DIAGNOSIS — D485 Neoplasm of uncertain behavior of skin: Secondary | ICD-10-CM | POA: Diagnosis not present

## 2016-05-26 DIAGNOSIS — L821 Other seborrheic keratosis: Secondary | ICD-10-CM | POA: Diagnosis not present

## 2016-06-08 ENCOUNTER — Ambulatory Visit: Payer: BLUE CROSS/BLUE SHIELD | Admitting: Internal Medicine

## 2016-06-10 ENCOUNTER — Encounter: Payer: Self-pay | Admitting: Family Medicine

## 2016-06-13 ENCOUNTER — Other Ambulatory Visit: Payer: Self-pay | Admitting: Internal Medicine

## 2016-06-15 ENCOUNTER — Encounter: Payer: Self-pay | Admitting: Internal Medicine

## 2016-06-15 ENCOUNTER — Ambulatory Visit (INDEPENDENT_AMBULATORY_CARE_PROVIDER_SITE_OTHER): Payer: Medicare Other | Admitting: Internal Medicine

## 2016-06-15 VITALS — BP 130/90 | HR 69 | Temp 98.8°F | Resp 20 | Ht 70.5 in | Wt 165.2 lb

## 2016-06-15 DIAGNOSIS — I10 Essential (primary) hypertension: Secondary | ICD-10-CM

## 2016-06-15 DIAGNOSIS — E119 Type 2 diabetes mellitus without complications: Secondary | ICD-10-CM

## 2016-06-15 DIAGNOSIS — E785 Hyperlipidemia, unspecified: Secondary | ICD-10-CM | POA: Diagnosis not present

## 2016-06-15 DIAGNOSIS — Z86010 Personal history of colonic polyps: Secondary | ICD-10-CM

## 2016-06-15 LAB — CBC WITH DIFFERENTIAL/PLATELET
BASOS ABS: 0 10*3/uL (ref 0.0–0.1)
Basophils Relative: 0.5 % (ref 0.0–3.0)
EOS ABS: 0.2 10*3/uL (ref 0.0–0.7)
EOS PCT: 3.2 % (ref 0.0–5.0)
HCT: 46.7 % (ref 39.0–52.0)
HEMOGLOBIN: 15.8 g/dL (ref 13.0–17.0)
Lymphocytes Relative: 26.8 % (ref 12.0–46.0)
Lymphs Abs: 1.6 10*3/uL (ref 0.7–4.0)
MCHC: 33.9 g/dL (ref 30.0–36.0)
MCV: 93.2 fl (ref 78.0–100.0)
MONO ABS: 0.4 10*3/uL (ref 0.1–1.0)
Monocytes Relative: 7.5 % (ref 3.0–12.0)
Neutro Abs: 3.7 10*3/uL (ref 1.4–7.7)
Neutrophils Relative %: 62 % (ref 43.0–77.0)
Platelets: 253 10*3/uL (ref 150.0–400.0)
RBC: 5.01 Mil/uL (ref 4.22–5.81)
RDW: 13.1 % (ref 11.5–15.5)
WBC: 6 10*3/uL (ref 4.0–10.5)

## 2016-06-15 LAB — COMPREHENSIVE METABOLIC PANEL
ALBUMIN: 4.4 g/dL (ref 3.5–5.2)
ALT: 14 U/L (ref 0–53)
AST: 14 U/L (ref 0–37)
Alkaline Phosphatase: 76 U/L (ref 39–117)
BUN: 19 mg/dL (ref 6–23)
CHLORIDE: 104 meq/L (ref 96–112)
CO2: 32 mEq/L (ref 19–32)
Calcium: 9.5 mg/dL (ref 8.4–10.5)
Creatinine, Ser: 1.03 mg/dL (ref 0.40–1.50)
GFR: 75.29 mL/min (ref >60.00)
Glucose, Bld: 112 mg/dL — ABNORMAL HIGH (ref 70–99)
POTASSIUM: 5.1 meq/L (ref 3.5–5.1)
SODIUM: 141 meq/L (ref 135–145)
Total Bilirubin: 0.4 mg/dL (ref 0.2–1.2)
Total Protein: 6.9 g/dL (ref 6.0–8.3)

## 2016-06-15 LAB — LIPID PANEL
CHOLESTEROL: 135 mg/dL (ref 0–200)
HDL: 45.4 mg/dL (ref >39.00)
LDL CALC: 65 mg/dL (ref 0–99)
NONHDL: 89.92
Total CHOL/HDL Ratio: 3
Triglycerides: 124 mg/dL (ref 0.0–149.0)
VLDL: 24.8 mg/dL (ref 0.0–40.0)

## 2016-06-15 LAB — MICROALBUMIN / CREATININE URINE RATIO
CREATININE, U: 233.9 mg/dL
MICROALB/CREAT RATIO: 3 mg/g (ref 0.0–30.0)
Microalb, Ur: 7 mg/dL — ABNORMAL HIGH (ref 0.0–1.9)

## 2016-06-15 LAB — HEMOGLOBIN A1C: HEMOGLOBIN A1C: 6.6 % — AB (ref 4.6–6.5)

## 2016-06-15 LAB — TSH: TSH: 2.23 u[IU]/mL (ref 0.35–4.50)

## 2016-06-15 MED ORDER — SILDENAFIL CITRATE 100 MG PO TABS: ORAL_TABLET | ORAL | 5 refills | Status: DC

## 2016-06-15 MED ORDER — METFORMIN HCL 500 MG PO TABS: ORAL_TABLET | ORAL | 3 refills | Status: DC

## 2016-06-15 MED ORDER — ACYCLOVIR 400 MG PO TABS: 400.0000 mg | ORAL_TABLET | Freq: Two times a day (BID) | ORAL | 1 refills | Status: DC

## 2016-06-15 MED ORDER — SIMVASTATIN 20 MG PO TABS: 20.0000 mg | ORAL_TABLET | Freq: Every day | ORAL | 3 refills | Status: DC

## 2016-06-15 NOTE — Progress Notes (Signed)
Subjective:    Patient ID: [name], male    DOB: [birth year], 75 y.o.   MRN: [id number]  HPI  75 year old patient who has type 2 diabetes.  He is seen today for his six-month follow-up.  Remains on statin therapy.  Last hemoglobin A1c increased to 7.2 and he has increased, metformin use.  No concerns or complaints. He has essential hypertension which has been stable  Past Medical History:  Diagnosis Date  . Bronchitis    hx of  . Chronic kidney disease    stone  . Cigarette smoker   . Colon polyp   . COPD (chronic obstructive pulmonary disease) ([place])    Pt reports does not have history of COPD  . Diverticulosis of colon   . DM (diabetes mellitus) ([place])   . Gastritis   . GERD (gastroesophageal reflux disease)   . Hemorrhoids   . Hiatal hernia   . History of BPH   . History of renal calculi   . HTN (hypertension)    pt states med is for kidney stones and not high bp.   . Hyperlipidemia   . IBS (irritable bowel syndrome)   . Lumbar back pain   . Neck pain   . Shortness of breath      Social History   Social History  . Marital status: Married    Spouse name: N/A  . Number of children: 2  . Years of education: N/A   Occupational History  . retired from [employer] Topics  . Smoking status: Current Some Day Smoker    Packs/day: 0.25    Years: 35.00  . Smokeless tobacco: Never Used     Comment: 1-3 cigarettes daily when golfing----Counseling given in exam room to quit smoking   . Alcohol use 1.5 oz/week    3 drink(s) per week     Comment: social  . Drug use: No  . Sexual activity: Not on file   Other Topics Concern  . Not on file   Social History Narrative  . No narrative on file    Past Surgical History:  Procedure Laterality Date  . ANTERIOR LAT LUMBAR FUSION  08/14/2012   Procedure: ANTERIOR LATERAL LUMBAR FUSION 1 LEVEL;  Surgeon: Charlie Pitter, MD;  Location: [facility];  Service: Neurosurgery;  Laterality: Left;  Left  lumbar three-four extreme lumbar interbody fusion with percutaneous pedicle screws   . BACK SURGERY     x 3  . [place]  . COLONOSCOPY W/ BIOPSIES AND POLYPECTOMY  05/06/2009   adenomous polyp, diverticulosis, external hemorrhoids  . CYSTOSCOPY  11/03   TURP and stone removal - Dr Amalia Hailey  . dental implants  2007  . LITHOTRIPSY     x 3  . LUMBAR MICRODISCECTOMY  12/07   L3-4 by Dr Annette Stable  . LUMBAR PERCUTANEOUS PEDICLE SCREW 1 LEVEL  08/14/2012   Procedure: LUMBAR PERCUTANEOUS PEDICLE SCREW 1 LEVEL;  Surgeon: Charlie Pitter, MD;  Location: [facility];  Service: Neurosurgery;  Laterality: Left;  Left lumbar three-four extreme lumbar interbody fusion with percutaneous pedicle screws   . ROTATOR CUFF REPAIR     left shoulder  . UPPER GASTROINTESTINAL ENDOSCOPY  09/07/2010   erosive gastritis, GERD  . UVULOPALATOPHARYNGOPLASTY  2001   Dr Wilburn Cornelia    Family History  Problem Relation Age of Onset  . Diabetes Father   . Colon cancer Mother     ?  she had met dis and chemoRx  . Esophageal cancer Neg Hx   . Rectal cancer Neg Hx   . Stomach cancer Neg Hx     No Known Allergies  Current Outpatient Prescriptions on File Prior to Visit  Medication Sig Dispense Refill  . aspirin 81 MG tablet Take 81 mg by mouth daily.      . hydrochlorothiazide (HYDRODIURIL) 25 MG tablet Take 12.5 mg by mouth daily.     Marland Kitchen HYDROcodone-acetaminophen (NORCO/VICODIN) 5-325 MG tablet TK 1 TO 2 TS PO Q 6 H PRF PAIN  0  . potassium citrate (UROCIT-K) 10 MEQ (1080 MG) SR tablet      No current facility-administered medications on file prior to visit.     BP 130/90 (BP Location: Left Arm, Patient Position: Sitting, Cuff Size: Normal)   Pulse 69   Temp 98.8 F (37.1 C) (Oral)   Resp 20   Ht 5' 10.5" (1.791 m)   Wt 165 lb 4 oz (75 kg)   SpO2 99%   BMI 23.38 kg/m     Review of Systems  Constitutional: Negative for appetite change, chills, fatigue and fever.  HENT: Negative for  congestion, dental problem, ear pain, hearing loss, sore throat, tinnitus, trouble swallowing and voice change.   Eyes: Negative for pain, discharge and visual disturbance.  Respiratory: Negative for cough, chest tightness, wheezing and stridor.   Cardiovascular: Negative for chest pain, palpitations and leg swelling.  Gastrointestinal: Negative for abdominal distention, abdominal pain, blood in stool, constipation, diarrhea, nausea and vomiting.  Genitourinary: Negative for difficulty urinating, discharge, flank pain, genital sores, hematuria and urgency.  Musculoskeletal: Negative for arthralgias, back pain, gait problem, joint swelling, myalgias and neck stiffness.  Skin: Negative for rash.  Neurological: Negative for dizziness, syncope, speech difficulty, weakness, numbness and headaches.  Hematological: Negative for adenopathy. Does not bruise/bleed easily.  Psychiatric/Behavioral: Negative for behavioral problems and dysphoric mood. The patient is not nervous/anxious.        Objective:   Physical Exam  Constitutional: He is oriented to person, place, and time. He appears well-developed.  HENT:  Head: Normocephalic.  Right Ear: External ear normal.  Left Ear: External ear normal.  Eyes: Conjunctivae and EOM are normal.  Neck: Normal range of motion.  Cardiovascular: Normal rate and normal heart sounds.   Pulmonary/Chest: Breath sounds normal.  Abdominal: Bowel sounds are normal.  Musculoskeletal: Normal range of motion. He exhibits no edema or tenderness.  Neurological: He is alert and oriented to person, place, and time.  Psychiatric: He has a normal mood and affect. His behavior is normal.          Assessment & Plan:   Diabetes mellitus.  Will check hemoglobin A1c.  In addition, we'll check a lipid profile and urine for microalbumin Essential hypertension, stable Dyslipidemia.  Continue statin therapy Ongoing tobacco use.  Total smoking cessation encouraged  CPX 6  months  Nyoka Cowden, MD

## 2016-06-15 NOTE — Patient Instructions (Signed)
Limit your sodium (Salt) intake    It is important that you exercise regularly, at least 20 minutes 3 to 4 times per week.  If you develop chest pain or shortness of breath seek  medical attention.  Smoking tobacco is very bad for your health. You should stop smoking immediately.  Return in 6 months for follow-up  

## 2016-06-15 NOTE — Progress Notes (Signed)
Pre visit review using our clinic review tool, if applicable. No additional management support is needed unless otherwise documented below in the visit note. 

## 2016-07-20 DIAGNOSIS — Z23 Encounter for immunization: Secondary | ICD-10-CM | POA: Diagnosis not present

## 2016-07-26 ENCOUNTER — Ambulatory Visit (INDEPENDENT_AMBULATORY_CARE_PROVIDER_SITE_OTHER): Payer: Medicare Other | Admitting: Family Medicine

## 2016-07-26 ENCOUNTER — Encounter: Payer: Self-pay | Admitting: Family Medicine

## 2016-07-26 VITALS — BP 120/74 | HR 78 | Temp 98.1°F | Wt 169.6 lb

## 2016-07-26 DIAGNOSIS — R0981 Nasal congestion: Secondary | ICD-10-CM | POA: Diagnosis not present

## 2016-07-26 DIAGNOSIS — R062 Wheezing: Secondary | ICD-10-CM

## 2016-07-26 DIAGNOSIS — R0989 Other specified symptoms and signs involving the circulatory and respiratory systems: Secondary | ICD-10-CM | POA: Diagnosis not present

## 2016-07-26 DIAGNOSIS — R05 Cough: Secondary | ICD-10-CM | POA: Diagnosis not present

## 2016-07-26 DIAGNOSIS — R059 Cough, unspecified: Secondary | ICD-10-CM

## 2016-07-26 DIAGNOSIS — J449 Chronic obstructive pulmonary disease, unspecified: Secondary | ICD-10-CM

## 2016-07-26 MED ORDER — VARENICLINE TARTRATE 0.5 MG X 11 & 1 MG X 42 PO MISC: ORAL | 0 refills | Status: DC

## 2016-07-26 MED ORDER — AZITHROMYCIN 250 MG PO TABS: ORAL_TABLET | ORAL | 0 refills | Status: DC

## 2016-07-26 MED ORDER — ALBUTEROL SULFATE HFA 108 (90 BASE) MCG/ACT IN AERS: 2.0000 | INHALATION_SPRAY | Freq: Four times a day (QID) | RESPIRATORY_TRACT | 0 refills | Status: DC | PRN

## 2016-07-26 NOTE — Progress Notes (Signed)
Pre visit review using our clinic review tool, if applicable. No additional management support is needed unless otherwise documented below in the visit note. 

## 2016-07-26 NOTE — Progress Notes (Signed)
PCP: Nyoka Cowden, MD  Subjective:  JAMOL REIM is a 75 y.o. year old very pleasant male patient who  presents with symptoms including nasal congestion, sinus tenderness but also some concern for COPD exacerbation with increased sputum production, change in color, wheezing but minimal SOB.  -day of illness:6 -Symptoms show no change -previous treatments: nyquil and mucinex have seemed to stabilize him -sick contacts/travel/risks: denies flu exposure. 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 acyclovir (ZOVIRAX) 400 MG tablet Take 1 tablet (400 mg total) by mouth 2 (two) times daily. 180 tablet 1  . aspirin 81 MG tablet Take 81 mg by mouth daily.      . hydrochlorothiazide (HYDRODIURIL) 25 MG tablet Take 12.5 mg by mouth daily.     Marland Kitchen HYDROcodone-acetaminophen (NORCO/VICODIN) 5-325 MG tablet TK 1 TO 2 TS PO Q 6 H PRF PAIN  0  . metFORMIN (GLUCOPHAGE) 500 MG tablet TAKE 1 TABLET BY MOUTH  DAILY WITH A MEAL. 90 tablet 3  . potassium citrate (UROCIT-K) 10 MEQ (1080 MG) SR tablet     . sildenafil (VIAGRA) 100 MG tablet TAKE 1 TABLET BY MOUTH EVERY DAY AS NEEDED FOR ERECTILE DYSFUNCTION 10 tablet 5  . simvastatin (ZOCOR) 20 MG tablet Take 1 tablet (20 mg total) by mouth at bedtime. 90 tablet 3   No current facility-administered medications for this visit.     Objective: BP 120/74 (BP Location: Left Arm, Patient Position: Sitting, Cuff Size: Large)   Pulse 78   Temp 98.1 F (36.7 C) (Oral)   Wt 169 lb 9.6 oz (76.9 kg)   SpO2 96%   BMI 23.99 kg/m  Gen: NAD, resting comfortably HEENT: Turbinates erythematous with scant yellow drainage, TM normal, pharynx mildly erythematous with no  tonsilar exudate or edema, minimal maxillary sinus tenderness CV: RRR no murmurs rubs or gallops Lungs: CTAB no crackles, wheeze, rhonchi Abdomen: soft/nontender/nondistended/normal bowel sounds. No rebound or guarding.  Ext: no edema Skin: warm, dry, no rash Neuro: grossly normal, moves all extremities  Assessment/Plan:  Cough, congestion, mild wheeze, sinus pressure- potential Sinsusitis vs COPD flare Viral based on <10 days, no double sickening, lack of severity of symptoms in first 3 days. We also discussed reasons why current illness does not meet criteria for bacterial illness and therefore no antibiotics indicated. Also educated on signs that bacterial infection may have developed.   Also discussed concern with very mild SOb and some wheeze could be COPD flare. Considered steroid but diabetic and has done well lately with a1c under 7. Appears in no distress today- reasonable to try to see if he turns the corner in next few days and if not better by day 10 or worsens- has azithromycin in hand to pick up which may help with both COPD flare or sinusitis bacterial. With wheeze-sent in inhaler to hav eon hand  Also praised him for quitting 6 weeks ago smoking- he wants chantix at this point to help with maintenance as helped in past - sent in 1 month and he will follow up with Dr. Raliegh Ip in a month- his confidence is low on maintaining cessation  Finally, we reviewed reasons to return to care including if symptoms worsen or persist or new  concerns arise.  Meds ordered this encounter  Medications  . varenicline (CHANTIX STARTING MONTH PAK) 0.5 MG X 11 & 1 MG X 42 tablet    Sig: Take one 0.5 mg tablet by mouth once daily for 3 days, then increase to one 0.5 mg tablet twice daily for 4 days, then increase to one 1 mg tablet twice daily.    Dispense:  53 tablet    Refill:  0  . albuterol (PROVENTIL HFA;VENTOLIN HFA) 108 (90 Base) MCG/ACT inhaler    Sig: Inhale 2 puffs into the lungs every 6 (six)  hours as needed for wheezing or shortness of breath.    Dispense:  1 Inhaler    Refill:  0  . azithromycin (ZITHROMAX) 250 MG tablet    Sig: Take 2 tabs on day 1, then 1 tab daily until finished. If symptoms worsen or fail to improve by Friday.    Dispense:  6 tablet    Refill:  0    Garret Reddish, MD

## 2016-07-26 NOTE — Patient Instructions (Signed)
Chantix to help with continued smoking cessation. Follow up with Dr. Raliegh Ip before you run out of starter pack  Albuterol as needed for wheezing with current episode or as needed in future (hold onto this)  Azithromycin if symptoms worsen or fail to improve by Friday. This would be for treatment of COPD flare vs. Sinusitis- since you tend to improve so much with this

## 2016-08-11 DIAGNOSIS — M25511 Pain in right shoulder: Secondary | ICD-10-CM | POA: Diagnosis not present

## 2016-08-19 DIAGNOSIS — M25511 Pain in right shoulder: Secondary | ICD-10-CM | POA: Diagnosis not present

## 2016-08-25 DIAGNOSIS — M25511 Pain in right shoulder: Secondary | ICD-10-CM | POA: Diagnosis not present

## 2016-08-25 DIAGNOSIS — M7541 Impingement syndrome of right shoulder: Secondary | ICD-10-CM | POA: Diagnosis not present

## 2016-09-02 DIAGNOSIS — E119 Type 2 diabetes mellitus without complications: Secondary | ICD-10-CM | POA: Diagnosis not present

## 2016-09-02 DIAGNOSIS — H52203 Unspecified astigmatism, bilateral: Secondary | ICD-10-CM | POA: Diagnosis not present

## 2016-09-02 DIAGNOSIS — H35372 Puckering of macula, left eye: Secondary | ICD-10-CM | POA: Diagnosis not present

## 2016-09-02 DIAGNOSIS — H25813 Combined forms of age-related cataract, bilateral: Secondary | ICD-10-CM | POA: Diagnosis not present

## 2016-09-02 LAB — HM DIABETES EYE EXAM

## 2016-09-03 ENCOUNTER — Encounter: Payer: Self-pay | Admitting: Internal Medicine

## 2016-09-07 DIAGNOSIS — M7541 Impingement syndrome of right shoulder: Secondary | ICD-10-CM | POA: Diagnosis not present

## 2016-10-06 DIAGNOSIS — M7541 Impingement syndrome of right shoulder: Secondary | ICD-10-CM | POA: Diagnosis not present

## 2016-10-26 DIAGNOSIS — H268 Other specified cataract: Secondary | ICD-10-CM | POA: Diagnosis not present

## 2016-10-26 DIAGNOSIS — H52202 Unspecified astigmatism, left eye: Secondary | ICD-10-CM | POA: Diagnosis not present

## 2016-10-26 DIAGNOSIS — H25812 Combined forms of age-related cataract, left eye: Secondary | ICD-10-CM | POA: Diagnosis not present

## 2016-11-05 DIAGNOSIS — M25511 Pain in right shoulder: Secondary | ICD-10-CM | POA: Diagnosis not present

## 2016-11-05 DIAGNOSIS — M7541 Impingement syndrome of right shoulder: Secondary | ICD-10-CM | POA: Diagnosis not present

## 2016-11-05 DIAGNOSIS — G8929 Other chronic pain: Secondary | ICD-10-CM | POA: Diagnosis not present

## 2016-11-07 ENCOUNTER — Encounter (HOSPITAL_COMMUNITY): Payer: Self-pay | Admitting: *Deleted

## 2016-11-07 ENCOUNTER — Ambulatory Visit (HOSPITAL_COMMUNITY)
Admission: EM | Admit: 2016-11-07 | Discharge: 2016-11-07 | Disposition: A | Discharge status: Home / Self Care | Payer: Medicare Other | Attending: Emergency Medicine | Admitting: Emergency Medicine

## 2016-11-07 DIAGNOSIS — R05 Cough: Secondary | ICD-10-CM

## 2016-11-07 DIAGNOSIS — J00 Acute nasopharyngitis [common cold]: Secondary | ICD-10-CM | POA: Diagnosis not present

## 2016-11-07 DIAGNOSIS — R059 Cough, unspecified: Secondary | ICD-10-CM

## 2016-11-07 NOTE — ED Triage Notes (Signed)
Nausea           Headache    And  sorethroat          Pt  Reports       Pt  Reports   Sibling  Has   Similar   Symptoms           Pt     Has  A   Slight   Cough  As   Well

## 2016-11-07 NOTE — Discharge Instructions (Signed)
Today  your exam is remarkable for minor irritation in the back of the throat otherwise clear. No evidence of bacterial infection. Her lungs are clear here for clear just minimal irritation of the throat. He certainly may be coming down with a type of virus with upper respiratory infection. There are no medications that will stop for cure this. He will just have to run its course. He may take Zyrtec now for any count of drainage. Tylenol or Advil for discomfort or development of fever. Antibiotics do not treat viral illnesses. He sure to stay well-hydrated to drink plenty of fluids and follow-up with your primary care doctor if needed, new symptoms problems or worsening.

## 2016-11-07 NOTE — ED Triage Notes (Signed)
Pt  Reports  A  Mild  Headache   And  sorethroat  As   Well

## 2016-11-07 NOTE — ED Provider Notes (Signed)
CSN: [id number]     Arrival date & time 11/07/16  1204 History   First MD Initiated Contact with Patient 11/07/16 1240     Chief Complaint  Patient presents with  . Cough   (Consider location/radiation/quality/duration/timing/severity/associated sxs/prior Treatment) 76 year old male appearing quite healthy and showing no signs of distress stating in the last couple days he has developed minor sore throat, chest cold and a mild headache. He denies fever, shortness of breath but has had minor PND for the past 1-2 days. He states his wife has URI symptoms and he wanted to "had this thing off" before it got worse.      Past Medical History:  Diagnosis Date  . Bronchitis    hx of  . Chronic kidney disease    stone  . Cigarette smoker   . Colon polyp   . COPD (chronic obstructive pulmonary disease) ([place])    Pt reports does not have history of COPD  . Diverticulosis of colon   . DM (diabetes mellitus) ([place])   . Gastritis   . GERD (gastroesophageal reflux disease)   . Hemorrhoids   . Hiatal hernia   . History of BPH   . History of renal calculi   . HTN (hypertension)    pt states med is for kidney stones and not high bp.   . Hyperlipidemia   . IBS (irritable bowel syndrome)   . Lumbar back pain   . Neck pain   . Shortness of breath    Past Surgical History:  Procedure Laterality Date  . ANTERIOR LAT LUMBAR FUSION  08/14/2012   Procedure: ANTERIOR LATERAL LUMBAR FUSION 1 LEVEL;  Surgeon: Charlie Pitter, MD;  Location: [facility];  Service: Neurosurgery;  Laterality: Left;  Left lumbar three-four extreme lumbar interbody fusion with percutaneous pedicle screws   . BACK SURGERY     x 3  . [place]  . COLONOSCOPY W/ BIOPSIES AND POLYPECTOMY  05/06/2009   adenomous polyp, diverticulosis, external hemorrhoids  . CYSTOSCOPY  11/03   TURP and stone removal - Dr Amalia Hailey  . dental implants  2007  . LITHOTRIPSY     x 3  . LUMBAR MICRODISCECTOMY  12/07   L3-4 by Dr Annette Stable  . LUMBAR PERCUTANEOUS PEDICLE SCREW 1 LEVEL  08/14/2012   Procedure: LUMBAR PERCUTANEOUS PEDICLE SCREW 1 LEVEL;  Surgeon: Charlie Pitter, MD;  Location: [facility];  Service: Neurosurgery;  Laterality: Left;  Left lumbar three-four extreme lumbar interbody fusion with percutaneous pedicle screws   . ROTATOR CUFF REPAIR     left shoulder  . UPPER GASTROINTESTINAL ENDOSCOPY  09/07/2010   erosive gastritis, GERD  . UVULOPALATOPHARYNGOPLASTY  2001   Dr Wilburn Cornelia   Family History  Problem Relation Age of Onset  . Diabetes Father   . Colon cancer Mother     ? she had met dis and chemoRx  . Esophageal cancer Neg Hx   . Rectal cancer Neg Hx   . Stomach cancer Neg Hx    Social History  Substance Use Topics  . Smoking status: Former Smoker    Packs/day: 0.25    Years: 35.00  . Smokeless tobacco: Never Used     Comment: 1-3 cigarettes daily when golfing----Counseling given in exam room to quit smoking   . Alcohol use 1.5 oz/week    3 Standard drinks or equivalent per week     Comment: social    Review of Systems  Constitutional: Positive  for activity change. Negative for fever.  HENT: Positive for postnasal drip and sore throat. Negative for congestion, ear pain and sinus pain.   Eyes: Negative.   Respiratory: Negative for cough, shortness of breath and wheezing.   Cardiovascular: Negative for chest pain.  Gastrointestinal: Negative.   Neurological: Positive for headaches.  All other systems reviewed and are negative.   Allergies  Patient has no known allergies.  Home Medications   Prior to Admission medications   Medication Sig Start Date End Date Taking? Authorizing Provider  acyclovir (ZOVIRAX) 400 MG tablet Take 1 tablet (400 mg total) by mouth 2 (two) times daily. 06/15/16   Marletta Lor, MD  albuterol (PROVENTIL HFA;VENTOLIN HFA) 108 (90 Base) MCG/ACT inhaler Inhale 2 puffs into the lungs every 6 (six) hours as needed for wheezing or shortness of  breath. 07/26/16   Marin Olp, MD  aspirin 81 MG tablet Take 81 mg by mouth daily.      Historical Provider, MD  hydrochlorothiazide (HYDRODIURIL) 25 MG tablet Take 12.5 mg by mouth daily.  02/23/14   Historical Provider, MD  HYDROcodone-acetaminophen (NORCO/VICODIN) 5-325 MG tablet TK 1 TO 2 TS PO Q 6 H PRF PAIN 11/18/15   Historical Provider, MD  metFORMIN (GLUCOPHAGE) 500 MG tablet TAKE 1 TABLET BY MOUTH  DAILY WITH A MEAL. 06/15/16   Marletta Lor, MD  potassium citrate (UROCIT-K) 10 MEQ (1080 MG) SR tablet  01/27/14   Historical Provider, MD  sildenafil (VIAGRA) 100 MG tablet TAKE 1 TABLET BY MOUTH EVERY DAY AS NEEDED FOR ERECTILE DYSFUNCTION 06/15/16   Marletta Lor, MD  simvastatin (ZOCOR) 20 MG tablet Take 1 tablet (20 mg total) by mouth at bedtime. 06/15/16   Marletta Lor, MD  varenicline (CHANTIX STARTING MONTH PAK) 0.5 MG X 11 & 1 MG X 42 tablet Take one 0.5 mg tablet by mouth once daily for 3 days, then increase to one 0.5 mg tablet twice daily for 4 days, then increase to one 1 mg tablet twice daily. 07/26/16   Marin Olp, MD   Meds Ordered and Administered this Visit  Medications - No data to display  BP 123/74 (BP Location: Right Arm)   Pulse 74   Temp 98.6 F (37 C)   Resp 18   SpO2 96%  No data found.   Physical Exam  Constitutional: He is oriented to person, place, and time. He appears well-developed and well-nourished. No distress.  Healthy-appearing male in no acute distress. No evidence of shortness of breath, cough, clearing of throat or other symptoms during my presence. Speech is clear and forces clear.  HENT:  Right Ear: External ear normal.  Left Ear: External ear normal.  Oropharynx with minimal erythema irritation. No swelling or exudates. Airway widely patent.  Eyes: EOM are normal. Pupils are equal, round, and reactive to light.  Neck: Normal range of motion. Neck supple.  Cardiovascular: Normal rate, regular rhythm, normal heart  sounds and intact distal pulses.   Pulmonary/Chest: Effort normal and breath sounds normal. No respiratory distress. He has no wheezes. He has no rales.  Musculoskeletal: Normal range of motion. He exhibits no edema.  Lymphadenopathy:    He has no cervical adenopathy.  Neurological: He is alert and oriented to person, place, and time.  Skin: Skin is warm and dry.  Psychiatric: He has a normal mood and affect.  Nursing note and vitals reviewed.   Urgent Care Course     Procedures (including critical  care time)  Labs Review Labs Reviewed - No data to display  Imaging Review No results found.   Visual Acuity Review  Right Eye Distance:   Left Eye Distance:   Bilateral Distance:    Right Eye Near:   Left Eye Near:    Bilateral Near:         MDM   1. Acute nasopharyngitis   2. Cough    Today  your exam is remarkable for minor irritation in the back of the throat otherwise clear. No evidence of bacterial infection. Her lungs are clear here for clear just minimal irritation of the throat. He certainly may be coming down with a type of virus with upper respiratory infection. There are no medications that will stop for cure this. He will just have to run its course. He may take Zyrtec now for any count of drainage. Tylenol or Advil for discomfort or development of fever. Antibiotics do not treat viral illnesses. He sure to stay well-hydrated to drink plenty of fluids and follow-up with your primary care doctor if needed, new symptoms problems or worsening.    Janne Napoleon, NP 11/07/16 1316

## 2016-11-08 DIAGNOSIS — G8929 Other chronic pain: Secondary | ICD-10-CM | POA: Diagnosis not present

## 2016-11-08 DIAGNOSIS — M7541 Impingement syndrome of right shoulder: Secondary | ICD-10-CM | POA: Diagnosis not present

## 2016-11-08 DIAGNOSIS — M25511 Pain in right shoulder: Secondary | ICD-10-CM | POA: Diagnosis not present

## 2016-11-12 ENCOUNTER — Ambulatory Visit (INDEPENDENT_AMBULATORY_CARE_PROVIDER_SITE_OTHER): Payer: Medicare Other | Admitting: Internal Medicine

## 2016-11-12 ENCOUNTER — Encounter: Payer: Self-pay | Admitting: Internal Medicine

## 2016-11-12 VITALS — BP 122/74 | HR 89 | Temp 98.4°F | Ht 70.5 in | Wt 169.6 lb

## 2016-11-12 DIAGNOSIS — J069 Acute upper respiratory infection, unspecified: Secondary | ICD-10-CM

## 2016-11-12 DIAGNOSIS — I10 Essential (primary) hypertension: Secondary | ICD-10-CM

## 2016-11-12 DIAGNOSIS — B9789 Other viral agents as the cause of diseases classified elsewhere: Secondary | ICD-10-CM

## 2016-11-12 DIAGNOSIS — E119 Type 2 diabetes mellitus without complications: Secondary | ICD-10-CM

## 2016-11-12 LAB — HEMOGLOBIN A1C: Hgb A1c MFr Bld: 7 % — ABNORMAL HIGH (ref 4.6–6.5)

## 2016-11-12 NOTE — Progress Notes (Signed)
Subjective:    Patient ID: [name], male    DOB: 02-Dec-1940, 76 y.o.   MRN: [id number]  HPI  Lab Results  Component Value Date   HGBA1C 6.6 (H) 06/15/2016   76 year old patient who has essential hypertension and type 2 diabetes.  He presents with a 2 day history of mild sore throat, head and chest congestion.  He is scheduled for cataract surgery next week.  He also has surgery scheduled for his right shoulder. His wife was diagnosed withinfluenza A.  5 days ago. No major pulmonary complaints.  He does have a history of COPD but has not smoked since October.  No wheezing  Past Medical History:  Diagnosis Date  . Bronchitis    hx of  . Chronic kidney disease    stone  . Cigarette smoker   . Colon polyp   . COPD (chronic obstructive pulmonary disease) ([place])    Pt reports does not have history of COPD  . Diverticulosis of colon   . DM (diabetes mellitus) ([place])   . Gastritis   . GERD (gastroesophageal reflux disease)   . Hemorrhoids   . Hiatal hernia   . History of BPH   . History of renal calculi   . HTN (hypertension)    pt states med is for kidney stones and not high bp.   . Hyperlipidemia   . IBS (irritable bowel syndrome)   . Lumbar back pain   . Neck pain   . Shortness of breath      Social History   Social History  . Marital status: Married    Spouse name: N/A  . Number of children: 2  . Years of education: N/A   Occupational History  . retired from [employer] Topics  . Smoking status: Former Smoker    Packs/day: 0.25    Years: 35.00  . Smokeless tobacco: Never Used     Comment: 1-3 cigarettes daily when golfing----Counseling given in exam room to quit smoking   . Alcohol use 1.5 oz/week    3 Standard drinks or equivalent per week     Comment: social  . Drug use: No  . Sexual activity: Not on file   Other Topics Concern  . Not on file   Social History Narrative  . No narrative on file    Past Surgical History:    Procedure Laterality Date  . ANTERIOR LAT LUMBAR FUSION  08/14/2012   Procedure: ANTERIOR LATERAL LUMBAR FUSION 1 LEVEL;  Surgeon: Charlie Pitter, MD;  Location: [facility];  Service: Neurosurgery;  Laterality: Left;  Left lumbar three-four extreme lumbar interbody fusion with percutaneous pedicle screws   . BACK SURGERY     x 3  . [place]  . COLONOSCOPY W/ BIOPSIES AND POLYPECTOMY  05/06/2009   adenomous polyp, diverticulosis, external hemorrhoids  . CYSTOSCOPY  11/03   TURP and stone removal - Dr Amalia Hailey  . dental implants  2007  . LITHOTRIPSY     x 3  . LUMBAR MICRODISCECTOMY  12/07   L3-4 by Dr Annette Stable  . LUMBAR PERCUTANEOUS PEDICLE SCREW 1 LEVEL  08/14/2012   Procedure: LUMBAR PERCUTANEOUS PEDICLE SCREW 1 LEVEL;  Surgeon: Charlie Pitter, MD;  Location: [facility];  Service: Neurosurgery;  Laterality: Left;  Left lumbar three-four extreme lumbar interbody fusion with percutaneous pedicle screws   . ROTATOR CUFF REPAIR     left shoulder  .  UPPER GASTROINTESTINAL ENDOSCOPY  09/07/2010   erosive gastritis, GERD  . UVULOPALATOPHARYNGOPLASTY  2001   Dr Wilburn Cornelia    Family History  Problem Relation Age of Onset  . Diabetes Father   . Colon cancer Mother     ? she had met dis and chemoRx  . Esophageal cancer Neg Hx   . Rectal cancer Neg Hx   . Stomach cancer Neg Hx     No Known Allergies  Current Outpatient Prescriptions on File Prior to Visit  Medication Sig Dispense Refill  . acyclovir (ZOVIRAX) 400 MG tablet Take 1 tablet (400 mg total) by mouth 2 (two) times daily. 180 tablet 1  . aspirin 81 MG tablet Take 81 mg by mouth daily.      . hydrochlorothiazide (HYDRODIURIL) 25 MG tablet Take 12.5 mg by mouth daily.     Marland Kitchen HYDROcodone-acetaminophen (NORCO/VICODIN) 5-325 MG tablet TK 1 TO 2 TS PO Q 6 H PRF PAIN  0  . metFORMIN (GLUCOPHAGE) 500 MG tablet TAKE 1 TABLET BY MOUTH  DAILY WITH A MEAL. 90 tablet 3  . potassium citrate (UROCIT-K) 10 MEQ (1080 MG)  SR tablet     . sildenafil (VIAGRA) 100 MG tablet TAKE 1 TABLET BY MOUTH EVERY DAY AS NEEDED FOR ERECTILE DYSFUNCTION 10 tablet 5  . simvastatin (ZOCOR) 20 MG tablet Take 1 tablet (20 mg total) by mouth at bedtime. 90 tablet 3  . varenicline (CHANTIX STARTING MONTH PAK) 0.5 MG X 11 & 1 MG X 42 tablet Take one 0.5 mg tablet by mouth once daily for 3 days, then increase to one 0.5 mg tablet twice daily for 4 days, then increase to one 1 mg tablet twice daily. 53 tablet 0   No current facility-administered medications on file prior to visit.     BP 122/74 (BP Location: Right Arm, Patient Position: Sitting, Cuff Size: Normal)   Pulse 89   Temp 98.4 F (36.9 C) (Oral)   Ht 5' 10.5" (1.791 m)   Wt 169 lb 9.6 oz (76.9 kg)   SpO2 95%   BMI 23.99 kg/m     Review of Systems  Constitutional: Positive for fatigue. Negative for appetite change, chills and fever.  HENT: Positive for congestion and sore throat. Negative for dental problem, ear pain, hearing loss, tinnitus, trouble swallowing and voice change.   Eyes: Negative for pain, discharge and visual disturbance.  Respiratory: Positive for cough. Negative for chest tightness, wheezing and stridor.   Cardiovascular: Negative for chest pain, palpitations and leg swelling.  Gastrointestinal: Negative for abdominal distention, abdominal pain, blood in stool, constipation, diarrhea, nausea and vomiting.  Genitourinary: Negative for difficulty urinating, discharge, flank pain, genital sores, hematuria and urgency.  Musculoskeletal: Negative for arthralgias, back pain, gait problem, joint swelling, myalgias and neck stiffness.  Skin: Negative for rash.  Neurological: Negative for dizziness, syncope, speech difficulty, weakness, numbness and headaches.  Hematological: Negative for adenopathy. Does not bruise/bleed easily.  Psychiatric/Behavioral: Negative for behavioral problems and dysphoric mood. The patient is not nervous/anxious.          Objective:   Physical Exam  Constitutional: He is oriented to person, place, and time. He appears well-developed.  HENT:  Head: Normocephalic.  Right Ear: External ear normal.  Left Ear: External ear normal.  Eyes: Conjunctivae and EOM are normal.  Neck: Normal range of motion.  Cardiovascular: Normal rate and normal heart sounds.   Pulmonary/Chest: Effort normal and breath sounds normal.  Abdominal: Bowel sounds are normal.  Musculoskeletal: Normal range of motion. He exhibits no edema or tenderness.  Neurological: He is alert and oriented to person, place, and time.  Psychiatric: He has a normal mood and affect. His behavior is normal.          Assessment & Plan:   Wire URI with pharyngitis Essential hypertension Type 2 diabetes.  Check hemoglobin A1c  CPX 6 months  Nyoka Cowden

## 2016-11-12 NOTE — Progress Notes (Signed)
Pre visit review using our clinic review tool, if applicable. No additional management support is needed unless otherwise documented below in the visit note. 

## 2016-11-12 NOTE — Patient Instructions (Addendum)
Acute bronchitis symptoms for less than 10 days are generally not helped by antibiotics.  Take over-the-counter expectorants and cough medications such as  Mucinex DM.  Call if there is no improvement in 5 to 7 days or if  you develop worsening cough, fever, or new symptoms, such as shortness of breath or chest pain.  Hydrate and Humidify  Drink enough water to keep your urine clear or pale yellow. Staying hydrated will help to thin your mucus.  Use a cool mist humidifier to keep the humidity level in your home above 50%.  Inhale steam for 10-15 minutes, 3-4 times a day or as told by your health care provider. You can do this in the bathroom while a hot shower is running.  Limit your exposure to cool or dry air. Rest  Rest as much as possible.  Sleep with your head raised (elevated).  Make sure to get enough sleep each night.  

## 2016-11-16 DIAGNOSIS — H52201 Unspecified astigmatism, right eye: Secondary | ICD-10-CM | POA: Diagnosis not present

## 2016-11-16 DIAGNOSIS — H25811 Combined forms of age-related cataract, right eye: Secondary | ICD-10-CM | POA: Diagnosis not present

## 2016-11-16 DIAGNOSIS — H268 Other specified cataract: Secondary | ICD-10-CM | POA: Diagnosis not present

## 2016-11-19 DIAGNOSIS — S0501XA Injury of conjunctiva and corneal abrasion without foreign body, right eye, initial encounter: Secondary | ICD-10-CM | POA: Diagnosis not present

## 2016-12-01 DIAGNOSIS — M7521 Bicipital tendinitis, right shoulder: Secondary | ICD-10-CM | POA: Diagnosis not present

## 2016-12-01 DIAGNOSIS — M24111 Other articular cartilage disorders, right shoulder: Secondary | ICD-10-CM | POA: Diagnosis not present

## 2016-12-01 DIAGNOSIS — M75121 Complete rotator cuff tear or rupture of right shoulder, not specified as traumatic: Secondary | ICD-10-CM | POA: Diagnosis not present

## 2016-12-01 DIAGNOSIS — M75101 Unspecified rotator cuff tear or rupture of right shoulder, not specified as traumatic: Secondary | ICD-10-CM | POA: Diagnosis not present

## 2016-12-01 DIAGNOSIS — M7541 Impingement syndrome of right shoulder: Secondary | ICD-10-CM | POA: Diagnosis not present

## 2016-12-01 DIAGNOSIS — M19011 Primary osteoarthritis, right shoulder: Secondary | ICD-10-CM | POA: Diagnosis not present

## 2016-12-01 DIAGNOSIS — M7551 Bursitis of right shoulder: Secondary | ICD-10-CM | POA: Diagnosis not present

## 2016-12-08 DIAGNOSIS — M25611 Stiffness of right shoulder, not elsewhere classified: Secondary | ICD-10-CM | POA: Diagnosis not present

## 2016-12-15 ENCOUNTER — Ambulatory Visit: Payer: Medicare Other | Admitting: Internal Medicine

## 2016-12-15 DIAGNOSIS — M7541 Impingement syndrome of right shoulder: Secondary | ICD-10-CM | POA: Diagnosis not present

## 2016-12-15 DIAGNOSIS — Z4789 Encounter for other orthopedic aftercare: Secondary | ICD-10-CM | POA: Diagnosis not present

## 2016-12-15 DIAGNOSIS — M75101 Unspecified rotator cuff tear or rupture of right shoulder, not specified as traumatic: Secondary | ICD-10-CM | POA: Diagnosis not present

## 2016-12-17 DIAGNOSIS — M75101 Unspecified rotator cuff tear or rupture of right shoulder, not specified as traumatic: Secondary | ICD-10-CM | POA: Diagnosis not present

## 2016-12-23 DIAGNOSIS — M75101 Unspecified rotator cuff tear or rupture of right shoulder, not specified as traumatic: Secondary | ICD-10-CM | POA: Diagnosis not present

## 2016-12-27 DIAGNOSIS — M75101 Unspecified rotator cuff tear or rupture of right shoulder, not specified as traumatic: Secondary | ICD-10-CM | POA: Diagnosis not present

## 2016-12-30 DIAGNOSIS — M75101 Unspecified rotator cuff tear or rupture of right shoulder, not specified as traumatic: Secondary | ICD-10-CM | POA: Diagnosis not present

## 2017-01-03 DIAGNOSIS — M75101 Unspecified rotator cuff tear or rupture of right shoulder, not specified as traumatic: Secondary | ICD-10-CM | POA: Diagnosis not present

## 2017-01-06 DIAGNOSIS — M75101 Unspecified rotator cuff tear or rupture of right shoulder, not specified as traumatic: Secondary | ICD-10-CM | POA: Diagnosis not present

## 2017-01-10 DIAGNOSIS — M75101 Unspecified rotator cuff tear or rupture of right shoulder, not specified as traumatic: Secondary | ICD-10-CM | POA: Diagnosis not present

## 2017-01-12 DIAGNOSIS — M25511 Pain in right shoulder: Secondary | ICD-10-CM | POA: Diagnosis not present

## 2017-01-12 DIAGNOSIS — M7541 Impingement syndrome of right shoulder: Secondary | ICD-10-CM | POA: Diagnosis not present

## 2017-01-12 DIAGNOSIS — M75101 Unspecified rotator cuff tear or rupture of right shoulder, not specified as traumatic: Secondary | ICD-10-CM | POA: Diagnosis not present

## 2017-01-12 DIAGNOSIS — Z4789 Encounter for other orthopedic aftercare: Secondary | ICD-10-CM | POA: Diagnosis not present

## 2017-01-12 DIAGNOSIS — G8929 Other chronic pain: Secondary | ICD-10-CM | POA: Diagnosis not present

## 2017-01-19 DIAGNOSIS — M75101 Unspecified rotator cuff tear or rupture of right shoulder, not specified as traumatic: Secondary | ICD-10-CM | POA: Diagnosis not present

## 2017-01-21 DIAGNOSIS — M75101 Unspecified rotator cuff tear or rupture of right shoulder, not specified as traumatic: Secondary | ICD-10-CM | POA: Diagnosis not present

## 2017-01-25 DIAGNOSIS — M75101 Unspecified rotator cuff tear or rupture of right shoulder, not specified as traumatic: Secondary | ICD-10-CM | POA: Diagnosis not present

## 2017-01-28 DIAGNOSIS — M75101 Unspecified rotator cuff tear or rupture of right shoulder, not specified as traumatic: Secondary | ICD-10-CM | POA: Diagnosis not present

## 2017-02-01 DIAGNOSIS — M75101 Unspecified rotator cuff tear or rupture of right shoulder, not specified as traumatic: Secondary | ICD-10-CM | POA: Diagnosis not present

## 2017-02-02 DIAGNOSIS — N200 Calculus of kidney: Secondary | ICD-10-CM | POA: Diagnosis not present

## 2017-02-02 DIAGNOSIS — Z125 Encounter for screening for malignant neoplasm of prostate: Secondary | ICD-10-CM | POA: Diagnosis not present

## 2017-02-04 DIAGNOSIS — M75101 Unspecified rotator cuff tear or rupture of right shoulder, not specified as traumatic: Secondary | ICD-10-CM | POA: Diagnosis not present

## 2017-02-08 DIAGNOSIS — M75101 Unspecified rotator cuff tear or rupture of right shoulder, not specified as traumatic: Secondary | ICD-10-CM | POA: Diagnosis not present

## 2017-02-10 DIAGNOSIS — M75101 Unspecified rotator cuff tear or rupture of right shoulder, not specified as traumatic: Secondary | ICD-10-CM | POA: Diagnosis not present

## 2017-02-15 DIAGNOSIS — M75101 Unspecified rotator cuff tear or rupture of right shoulder, not specified as traumatic: Secondary | ICD-10-CM | POA: Diagnosis not present

## 2017-02-17 DIAGNOSIS — M75101 Unspecified rotator cuff tear or rupture of right shoulder, not specified as traumatic: Secondary | ICD-10-CM | POA: Diagnosis not present

## 2017-02-22 DIAGNOSIS — M25611 Stiffness of right shoulder, not elsewhere classified: Secondary | ICD-10-CM | POA: Diagnosis not present

## 2017-02-23 DIAGNOSIS — Z4789 Encounter for other orthopedic aftercare: Secondary | ICD-10-CM | POA: Diagnosis not present

## 2017-02-28 DIAGNOSIS — M75101 Unspecified rotator cuff tear or rupture of right shoulder, not specified as traumatic: Secondary | ICD-10-CM | POA: Diagnosis not present

## 2017-03-03 DIAGNOSIS — M25611 Stiffness of right shoulder, not elsewhere classified: Secondary | ICD-10-CM | POA: Diagnosis not present

## 2017-04-26 DIAGNOSIS — M25611 Stiffness of right shoulder, not elsewhere classified: Secondary | ICD-10-CM | POA: Diagnosis not present

## 2017-04-26 DIAGNOSIS — Z4789 Encounter for other orthopedic aftercare: Secondary | ICD-10-CM | POA: Diagnosis not present

## 2017-05-13 ENCOUNTER — Encounter: Payer: Self-pay | Admitting: Internal Medicine

## 2017-05-13 ENCOUNTER — Telehealth: Payer: Self-pay | Admitting: Internal Medicine

## 2017-05-13 ENCOUNTER — Ambulatory Visit (INDEPENDENT_AMBULATORY_CARE_PROVIDER_SITE_OTHER): Payer: Medicare Other | Admitting: Internal Medicine

## 2017-05-13 DIAGNOSIS — I10 Essential (primary) hypertension: Secondary | ICD-10-CM

## 2017-05-13 DIAGNOSIS — Z86010 Personal history of colonic polyps: Secondary | ICD-10-CM | POA: Diagnosis not present

## 2017-05-13 DIAGNOSIS — E119 Type 2 diabetes mellitus without complications: Secondary | ICD-10-CM | POA: Diagnosis not present

## 2017-05-13 DIAGNOSIS — M150 Primary generalized (osteo)arthritis: Secondary | ICD-10-CM | POA: Diagnosis not present

## 2017-05-13 DIAGNOSIS — E785 Hyperlipidemia, unspecified: Secondary | ICD-10-CM | POA: Diagnosis not present

## 2017-05-13 DIAGNOSIS — M159 Polyosteoarthritis, unspecified: Secondary | ICD-10-CM

## 2017-05-13 DIAGNOSIS — M48062 Spinal stenosis, lumbar region with neurogenic claudication: Secondary | ICD-10-CM | POA: Diagnosis not present

## 2017-05-13 LAB — POCT GLYCOSYLATED HEMOGLOBIN (HGB A1C): HEMOGLOBIN A1C: 6.7

## 2017-05-13 NOTE — Patient Instructions (Signed)
Limit your sodium (Salt) intake    It is important that you exercise regularly, at least 20 minutes 3 to 4 times per week.  If you develop chest pain or shortness of breath seek  medical attention.  Return in 6 months for follow-up    Please check your hemoglobin A1c every 6 months

## 2017-05-13 NOTE — Progress Notes (Signed)
Subjective:    Patient ID: [name], male    DOB: 1941/07/05, 76 y.o.   MRN: [id number]  HPI  76 year old patient who is seen today for an annual exam in Medicare wellness visit He has a history of type 2 diabetes.  He also has a history of COPD and remote tobacco use.  He discontinued tobacco use in 2017.  Lab Results  Component Value Date   HGBA1C 7.0 (H) 11/12/2016   No major concerns or complaints.  Earlier this year he had right rotator cuff surgery and also bilateral cataract extraction surgery.  He has a history of essential hypertension  He also has a history colonic polyps.  Last colonoscopy 2014  Past Medical History:  Diagnosis Date  . Bronchitis    hx of  . Chronic kidney disease    stone  . Cigarette smoker   . Colon polyp   . COPD (chronic obstructive pulmonary disease) ([place])    Pt reports does not have history of COPD  . Diverticulosis of colon   . DM (diabetes mellitus) ([place])   . Gastritis   . GERD (gastroesophageal reflux disease)   . Hemorrhoids   . Hiatal hernia   . History of BPH   . History of renal calculi   . HTN (hypertension)    pt states med is for kidney stones and not high bp.   . Hyperlipidemia   . IBS (irritable bowel syndrome)   . Lumbar back pain   . Neck pain   . Shortness of breath      Social History   Social History  . Marital status: Married    Spouse name: N/A  . Number of children: 2  . Years of education: N/A   Occupational History  . retired from [employer] Topics  . Smoking status: Former Smoker    Packs/day: 0.25    Years: 35.00  . Smokeless tobacco: Never Used     Comment: 1-3 cigarettes daily when golfing----Counseling given in exam room to quit smoking   . Alcohol use 1.5 oz/week    3 Standard drinks or equivalent per week     Comment: social  . Drug use: No  . Sexual activity: Not on file   Other Topics Concern  . Not on file   Social History Narrative  . No narrative on  file    Past Surgical History:  Procedure Laterality Date  . ANTERIOR LAT LUMBAR FUSION  08/14/2012   Procedure: ANTERIOR LATERAL LUMBAR FUSION 1 LEVEL;  Surgeon: Charlie Pitter, MD;  Location: [place] [facility];  Service: Neurosurgery;  Laterality: Left;  Left lumbar three-four extreme lumbar interbody fusion with percutaneous pedicle screws   . BACK SURGERY     x 3  . [place]  . COLONOSCOPY W/ BIOPSIES AND POLYPECTOMY  05/06/2009   adenomous polyp, diverticulosis, external hemorrhoids  . CYSTOSCOPY  11/03   TURP and stone removal - Dr Amalia Hailey  . dental implants  2007  . LITHOTRIPSY     x 3  . LUMBAR MICRODISCECTOMY  12/07   L3-4 by Dr Annette Stable  . LUMBAR PERCUTANEOUS PEDICLE SCREW 1 LEVEL  08/14/2012   Procedure: LUMBAR PERCUTANEOUS PEDICLE SCREW 1 LEVEL;  Surgeon: Charlie Pitter, MD;  Location: [place] [facility];  Service: Neurosurgery;  Laterality: Left;  Left lumbar three-four extreme lumbar interbody fusion with percutaneous pedicle screws   . ROTATOR CUFF REPAIR  left shoulder  . UPPER GASTROINTESTINAL ENDOSCOPY  09/07/2010   erosive gastritis, GERD  . UVULOPALATOPHARYNGOPLASTY  2001   Dr Wilburn Cornelia    Family History  Problem Relation Age of Onset  . Diabetes Father   . Colon cancer Mother        ? she had met dis and chemoRx  . Esophageal cancer Neg Hx   . Rectal cancer Neg Hx   . Stomach cancer Neg Hx     No Known Allergies  Current Outpatient Prescriptions on File Prior to Visit  Medication Sig Dispense Refill  . acyclovir (ZOVIRAX) 400 MG tablet Take 1 tablet (400 mg total) by mouth 2 (two) times daily. 180 tablet 1  . aspirin 81 MG tablet Take 81 mg by mouth daily.      . hydrochlorothiazide (HYDRODIURIL) 25 MG tablet Take 12.5 mg by mouth daily.     Marland Kitchen HYDROcodone-acetaminophen (NORCO/VICODIN) 5-325 MG tablet TK 1 TO 2 TS PO Q 6 H PRF PAIN  0  . metFORMIN (GLUCOPHAGE) 500 MG tablet TAKE 1 TABLET BY MOUTH  DAILY WITH A MEAL. 90 tablet 3  . potassium  citrate (UROCIT-K) 10 MEQ (1080 MG) SR tablet     . sildenafil (VIAGRA) 100 MG tablet TAKE 1 TABLET BY MOUTH EVERY DAY AS NEEDED FOR ERECTILE DYSFUNCTION 10 tablet 5  . simvastatin (ZOCOR) 20 MG tablet Take 1 tablet (20 mg total) by mouth at bedtime. 90 tablet 3  . varenicline (CHANTIX STARTING MONTH PAK) 0.5 MG X 11 & 1 MG X 42 tablet Take one 0.5 mg tablet by mouth once daily for 3 days, then increase to one 0.5 mg tablet twice daily for 4 days, then increase to one 1 mg tablet twice daily. 53 tablet 0   No current facility-administered medications on file prior to visit.     BP 130/72 (BP Location: Left Arm, Patient Position: Sitting, Cuff Size: Normal)   Pulse (!) 112   Temp 98.2 F (36.8 C) (Oral)   Ht 5' 10.5" (1.791 m)   Wt 162 lb (73.5 kg)   SpO2 99%   BMI 22.92 kg/m   Medicare wellness visit  1. Risk factors, based on past  M,S,F history . Cardiovascular risk factors include type 2 diabetes and history of hypertension as well as dyslipidemia  2.  Physical activities:remains quite active no exercise limitations.  Active with golf  3.  Depression/mood:no history depression or mood disorder  4.  Hearing:no deficits  5.  ADL's:independent in aspects of daily living  6.  Fall risk:low  7.  Home safety:no prongs identified  8.  Height weight, and visual acuity;height and weight stable no change in visual acuity  9.  Counseling:continue heart healthy diet and regular exercise  10. Lab orders based on risk factors:we'll check hemoglobin A1c  11. Referral : been appropriate at this time  12. Care plan:continue efforts at aggressive risk factor modification  13. Cognitive assessment: alert and oriented with normal affect.  No cognitive dysfunction  14. Screening: Patient provided with a written and personalized 5-10 year screening schedule in the AVS.    15. Provider List Update: ophthalmology GI primary care    Review of Systems  Constitutional: Positive for  unexpected weight change. Negative for appetite change, chills, fatigue and fever.  HENT: Negative for congestion, dental problem, ear pain, hearing loss, sore throat, tinnitus, trouble swallowing and voice change.   Eyes: Negative for pain, discharge and visual disturbance.  Respiratory: Negative for cough,  chest tightness, wheezing and stridor.   Cardiovascular: Negative for chest pain, palpitations and leg swelling.  Gastrointestinal: Negative for abdominal distention, abdominal pain, blood in stool, constipation, diarrhea, nausea and vomiting.  Genitourinary: Negative for difficulty urinating, discharge, flank pain, genital sores, hematuria and urgency.  Musculoskeletal: Negative for arthralgias, back pain, gait problem, joint swelling, myalgias and neck stiffness.  Skin: Negative for rash.  Neurological: Negative for dizziness, syncope, speech difficulty, weakness, numbness and headaches.  Hematological: Negative for adenopathy. Does not bruise/bleed easily.  Psychiatric/Behavioral: Negative for behavioral problems and dysphoric mood. The patient is not nervous/anxious.        Objective:   Physical Exam  Constitutional: He is oriented to person, place, and time. He appears well-developed.  HENT:  Head: Normocephalic.  Right Ear: External ear normal.  Left Ear: External ear normal.  Eyes: Conjunctivae and EOM are normal.  Neck: Normal range of motion.  Cardiovascular: Normal rate and normal heart sounds.   Pulmonary/Chest: Breath sounds normal.  Abdominal: Bowel sounds are normal.  Genitourinary: Rectal exam shows guaiac negative stool.  Genitourinary Comments: Prostate moderately enlarged  Musculoskeletal: Normal range of motion. He exhibits no edema or tenderness.  Neurological: He is alert and oriented to person, place, and time.  Psychiatric: He has a normal mood and affect. His behavior is normal.          Assessment & Plan:   Medicare wellness visit Essential  hypertension, well-controlled Type 2 diabetes.  Will review a hemoglobin A1c Dyslipidemia.  Continue statin therapy COPD stable History of IBS History colonic polyps  No change in medical regimen Follow-up 6 months  Memori Sammon Pilar Plate

## 2017-05-13 NOTE — Telephone Encounter (Signed)
° ° ° °  Pt refuse AWV

## 2017-05-26 DIAGNOSIS — D225 Melanocytic nevi of trunk: Secondary | ICD-10-CM | POA: Diagnosis not present

## 2017-05-26 DIAGNOSIS — D2371 Other benign neoplasm of skin of right lower limb, including hip: Secondary | ICD-10-CM | POA: Diagnosis not present

## 2017-05-26 DIAGNOSIS — D485 Neoplasm of uncertain behavior of skin: Secondary | ICD-10-CM | POA: Diagnosis not present

## 2017-05-26 DIAGNOSIS — L570 Actinic keratosis: Secondary | ICD-10-CM | POA: Diagnosis not present

## 2017-05-26 DIAGNOSIS — L821 Other seborrheic keratosis: Secondary | ICD-10-CM | POA: Diagnosis not present

## 2017-05-26 DIAGNOSIS — Z86018 Personal history of other benign neoplasm: Secondary | ICD-10-CM | POA: Diagnosis not present

## 2017-05-26 DIAGNOSIS — Z808 Family history of malignant neoplasm of other organs or systems: Secondary | ICD-10-CM | POA: Diagnosis not present

## 2017-06-28 ENCOUNTER — Ambulatory Visit (INDEPENDENT_AMBULATORY_CARE_PROVIDER_SITE_OTHER): Payer: Medicare Other

## 2017-06-28 DIAGNOSIS — Z23 Encounter for immunization: Secondary | ICD-10-CM

## 2017-07-31 ENCOUNTER — Other Ambulatory Visit: Payer: Self-pay | Admitting: Internal Medicine

## 2017-09-01 DIAGNOSIS — Z4789 Encounter for other orthopedic aftercare: Secondary | ICD-10-CM | POA: Diagnosis not present

## 2017-09-01 DIAGNOSIS — M7541 Impingement syndrome of right shoulder: Secondary | ICD-10-CM | POA: Diagnosis not present

## 2017-09-02 ENCOUNTER — Other Ambulatory Visit: Payer: Self-pay | Admitting: Internal Medicine

## 2017-10-18 HISTORY — PX: EYE SURGERY: SHX253

## 2017-11-07 ENCOUNTER — Other Ambulatory Visit: Payer: Self-pay | Admitting: Internal Medicine

## 2017-11-10 ENCOUNTER — Encounter: Payer: Self-pay | Admitting: Internal Medicine

## 2017-11-10 ENCOUNTER — Other Ambulatory Visit: Payer: Self-pay | Admitting: Internal Medicine

## 2017-11-10 ENCOUNTER — Ambulatory Visit (INDEPENDENT_AMBULATORY_CARE_PROVIDER_SITE_OTHER): Payer: Medicare Other | Admitting: Internal Medicine

## 2017-11-10 VITALS — BP 110/70 | HR 77 | Temp 97.6°F | Ht 70.5 in | Wt 171.6 lb

## 2017-11-10 DIAGNOSIS — E119 Type 2 diabetes mellitus without complications: Secondary | ICD-10-CM | POA: Diagnosis not present

## 2017-11-10 DIAGNOSIS — I10 Essential (primary) hypertension: Secondary | ICD-10-CM | POA: Diagnosis not present

## 2017-11-10 DIAGNOSIS — E785 Hyperlipidemia, unspecified: Secondary | ICD-10-CM

## 2017-11-10 LAB — COMPREHENSIVE METABOLIC PANEL
ALBUMIN: 4.3 g/dL (ref 3.5–5.2)
ALK PHOS: 75 U/L (ref 39–117)
ALT: 15 U/L (ref 0–53)
AST: 14 U/L (ref 0–37)
BILIRUBIN TOTAL: 0.6 mg/dL (ref 0.2–1.2)
BUN: 18 mg/dL (ref 6–23)
CALCIUM: 9.4 mg/dL (ref 8.4–10.5)
CO2: 31 mEq/L (ref 19–32)
Chloride: 103 mEq/L (ref 96–112)
Creatinine, Ser: 1 mg/dL (ref 0.40–1.50)
GFR: 77.61 mL/min (ref >60.00)
GLUCOSE: 136 mg/dL — AB (ref 70–99)
POTASSIUM: 4.6 meq/L (ref 3.5–5.1)
Sodium: 139 mEq/L (ref 135–145)
TOTAL PROTEIN: 6.4 g/dL (ref 6.0–8.3)

## 2017-11-10 LAB — CBC WITH DIFFERENTIAL/PLATELET
Basophils Absolute: 0 10*3/uL (ref 0.0–0.1)
Basophils Relative: 0.8 % (ref 0.0–3.0)
EOS ABS: 0.2 10*3/uL (ref 0.0–0.7)
Eosinophils Relative: 3.5 % (ref 0.0–5.0)
HCT: 46.6 % (ref 39.0–52.0)
HEMOGLOBIN: 15.6 g/dL (ref 13.0–17.0)
Lymphocytes Relative: 30.5 % (ref 12.0–46.0)
Lymphs Abs: 1.5 10*3/uL (ref 0.7–4.0)
MCHC: 33.6 g/dL (ref 30.0–36.0)
MCV: 93.2 fl (ref 78.0–100.0)
MONO ABS: 0.4 10*3/uL (ref 0.1–1.0)
Monocytes Relative: 8.5 % (ref 3.0–12.0)
Neutro Abs: 2.9 10*3/uL (ref 1.4–7.7)
Neutrophils Relative %: 56.7 % (ref 43.0–77.0)
Platelets: 244 10*3/uL (ref 150.0–400.0)
RBC: 5 Mil/uL (ref 4.22–5.81)
RDW: 13.2 % (ref 11.5–15.5)
WBC: 5.1 10*3/uL (ref 4.0–10.5)

## 2017-11-10 LAB — MICROALBUMIN / CREATININE URINE RATIO
CREATININE, U: 189.2 mg/dL
MICROALB UR: 6.7 mg/dL — AB (ref 0.0–1.9)
MICROALB/CREAT RATIO: 3.5 mg/g (ref 0.0–30.0)

## 2017-11-10 LAB — LIPID PANEL
Cholesterol: 130 mg/dL (ref 0–200)
HDL: 44.3 mg/dL (ref >39.00)
LDL Cholesterol: 65 mg/dL (ref 0–99)
NONHDL: 85.39
TRIGLYCERIDES: 103 mg/dL (ref 0.0–149.0)
Total CHOL/HDL Ratio: 3
VLDL: 20.6 mg/dL (ref 0.0–40.0)

## 2017-11-10 LAB — POCT GLYCOSYLATED HEMOGLOBIN (HGB A1C): Hemoglobin A1C: 6.3

## 2017-11-10 LAB — TSH: TSH: 2.6 u[IU]/mL (ref 0.35–4.50)

## 2017-11-10 NOTE — Patient Instructions (Addendum)
Limit your sodium (Salt) intake   Please check your hemoglobin A1c every 3-6  Months  Please see your eye doctor yearly to check for diabetic eye damage  Return in 6 months for follow-up   Smoking tobacco is very bad for your health. You should stop smoking immediately.

## 2017-11-10 NOTE — Progress Notes (Signed)
Subjective:    Patient ID: [name], male    DOB: 1941-02-14, 77 y.o.   MRN: [id number]  HPI 77 year old patient who is seen today for his 56-monthfollow-up.  He does quite well.  He has hypertension dyslipidemia and type 2 diabetes. He is a very low volume smoker. No concerns or complaints.  No cardiopulmonary complaints.  Status post cataract surgery about 1 year ago  Lab Results  Component Value Date   HGBA1C 6.3 11/10/2017    Past Medical History:  Diagnosis Date  . Bronchitis    hx of  . Chronic kidney disease    stone  . Cigarette smoker   . Colon polyp   . COPD [place] disease) [place]    Pt reports does not have history of COPD  . Diverticulosis of colon   . DM (diabetes mellitus) ([place]   . Gastritis   . GERD (gastroesophageal reflux disease)   . Hemorrhoids   . Hiatal hernia   . History of BPH   . History of renal calculi   . HTN (hypertension)    pt states med is for kidney stones and not high bp.   . Hyperlipidemia   . IBS (irritable bowel syndrome)   . Lumbar back pain   . Neck pain   . Shortness of breath      Social History   Socioeconomic History  . Marital status: Married    Spouse name: Not on file  . Number of children: 2  . Years of education: Not on file  . Highest education level: Not on file  Social Needs  . Financial resource strain: Not on file  . Food insecurity - worry: Not on file  . Food insecurity - inability: Not on file  . Transportation needs - medical: Not on file  . Transportation needs - non-medical: Not on file  Occupational History  . Occupation: retired from [employer]  . Smoking status: Former Smoker    Packs/day: 0.25    Years: 35.00    Pack years: 8.75    Last attempt to quit: 06/18/2016    Years since quitting: 1.3  . Smokeless tobacco: Never Used  . Tobacco comment: 1-3 cigarettes daily when golfing----Counseling given in exam room to quit smoking   Substance and  Sexual Activity  . Alcohol use: Yes    Alcohol/week: 1.5 oz    Types: 3 Standard drinks or equivalent per week    Comment: social  . Drug use: No  . Sexual activity: Not on file  Other Topics Concern  . Not on file  Social History Narrative  . Not on file    Past Surgical History:  Procedure Laterality Date  . ANTERIOR LAT LUMBAR FUSION  08/14/2012   Procedure: ANTERIOR LATERAL LUMBAR FUSION 1 LEVEL;  Surgeon: HCharlie Pitter MD;  [facility];  Service: Neurosurgery;  Laterality: Left;  Left lumbar three-four extreme lumbar interbody fusion with percutaneous pedicle screws   . BACK SURGERY     x 3  . [place] . COLONOSCOPY W/ BIOPSIES AND POLYPECTOMY  05/06/2009   adenomous polyp, diverticulosis, external hemorrhoids  . CYSTOSCOPY  11/03   TURP and stone removal - Dr EAmalia Hailey . dental implants  2007  . LITHOTRIPSY     x 3  . LUMBAR MICRODISCECTOMY  12/07   L3-4 by Dr PAnnette Stable . LUMBAR PERCUTANEOUS PEDICLE SCREW 1 LEVEL  08/14/2012   Procedure: LUMBAR PERCUTANEOUS PEDICLE SCREW 1 LEVEL;  Surgeon: Charlie Pitter, MD;  Location: [place] [facility];  Service: Neurosurgery;  Laterality: Left;  Left lumbar three-four extreme lumbar interbody fusion with percutaneous pedicle screws   . ROTATOR CUFF REPAIR     left shoulder  . UPPER GASTROINTESTINAL ENDOSCOPY  09/07/2010   erosive gastritis, GERD  . UVULOPALATOPHARYNGOPLASTY  2001   Dr Wilburn Cornelia    Family History  Problem Relation Age of Onset  . Diabetes Father   . Colon cancer Mother        ? she had met dis and chemoRx  . Esophageal cancer Neg Hx   . Rectal cancer Neg Hx   . Stomach cancer Neg Hx     No Known Allergies  Current Outpatient Medications on File Prior to Visit  Medication Sig Dispense Refill  . acyclovir (ZOVIRAX) 400 MG tablet Take 1 tablet (400 mg total) by mouth 2 (two) times daily. 180 tablet 1  . aspirin 81 MG tablet Take 81 mg by mouth daily.      . hydrochlorothiazide  (HYDRODIURIL) 25 MG tablet Take 12.5 mg by mouth daily.     Marland Kitchen HYDROcodone-acetaminophen (NORCO/VICODIN) 5-325 MG tablet TK 1 TO 2 TS PO Q 6 H PRF PAIN  0  . metFORMIN (GLUCOPHAGE) 500 MG tablet TAKE 1 TABLET BY MOUTH DAILY WITH A MEAL 90 tablet 0  . potassium citrate (UROCIT-K) 10 MEQ (1080 MG) SR tablet     . sildenafil (VIAGRA) 100 MG tablet TAKE 1 TABLET BY MOUTH EVERY DAY AS NEEDED 10 tablet 0  . simvastatin (ZOCOR) 20 MG tablet TAKE 1 TABLET BY MOUTH AT BEDTIME 90 tablet 0   No current facility-administered medications on file prior to visit.     BP 110/70 (BP Location: Right Arm, Patient Position: Sitting, Cuff Size: Large)   Pulse 77   Temp 97.6 F (36.4 C) (Oral)   Ht 5' 10.5" (1.791 m)   Wt 171 lb 9.6 oz (77.8 kg)   SpO2 97%   BMI 24.27 kg/m     Review of Systems  Constitutional: Negative for appetite change, chills, fatigue and fever.  HENT: Negative for congestion, dental problem, ear pain, hearing loss, sore throat, tinnitus, trouble swallowing and voice change.   Eyes: Negative for pain, discharge and visual disturbance.  Respiratory: Negative for cough, chest tightness, wheezing and stridor.   Cardiovascular: Negative for chest pain, palpitations and leg swelling.  Gastrointestinal: Negative for abdominal distention, abdominal pain, blood in stool, constipation, diarrhea, nausea and vomiting.  Genitourinary: Negative for difficulty urinating, discharge, flank pain, genital sores, hematuria and urgency.  Musculoskeletal: Negative for arthralgias, back pain, gait problem, joint swelling, myalgias and neck stiffness.  Skin: Negative for rash.  Neurological: Negative for dizziness, syncope, speech difficulty, weakness, numbness and headaches.  Hematological: Negative for adenopathy. Does not bruise/bleed easily.  Psychiatric/Behavioral: Negative for behavioral problems and dysphoric mood. The patient is not nervous/anxious.        Objective:   Physical Exam    Constitutional: He is oriented to person, place, and time. He appears well-developed.  HENT:  Head: Normocephalic.  Right Ear: External ear normal.  Left Ear: External ear normal.  Eyes: Conjunctivae and EOM are normal.  Neck: Normal range of motion.  Cardiovascular: Normal rate and normal heart sounds.  Pulmonary/Chest: Breath sounds normal.  Abdominal: Bowel sounds are normal.  Musculoskeletal: Normal range of motion. He exhibits no edema or tenderness.  Neurological: He is  alert and oriented to person, place, and time.  Psychiatric: He has a normal mood and affect. His behavior is normal.          Assessment & Plan:  Diabetes mellitus.  Hemoglobin A1c at goal  Essential hypertension well-controlled blood pressure low normal today Low volume tobacco use.  Total smoking cessation encouraged Dyslipidemia continue statin therapy  We will check updated lab including lipid profile and urine for microalbumin  CPX 6 months  Zona Pedro Pilar Plate

## 2017-12-20 DIAGNOSIS — M25511 Pain in right shoulder: Secondary | ICD-10-CM | POA: Diagnosis not present

## 2017-12-26 DIAGNOSIS — M25511 Pain in right shoulder: Secondary | ICD-10-CM | POA: Diagnosis not present

## 2017-12-29 DIAGNOSIS — Z961 Presence of intraocular lens: Secondary | ICD-10-CM | POA: Diagnosis not present

## 2017-12-29 DIAGNOSIS — E119 Type 2 diabetes mellitus without complications: Secondary | ICD-10-CM | POA: Diagnosis not present

## 2017-12-29 DIAGNOSIS — H35373 Puckering of macula, bilateral: Secondary | ICD-10-CM | POA: Diagnosis not present

## 2017-12-29 DIAGNOSIS — H524 Presbyopia: Secondary | ICD-10-CM | POA: Diagnosis not present

## 2017-12-29 LAB — HM DIABETES EYE EXAM

## 2018-01-04 ENCOUNTER — Encounter: Payer: Self-pay | Admitting: Internal Medicine

## 2018-01-04 DIAGNOSIS — M25511 Pain in right shoulder: Secondary | ICD-10-CM | POA: Diagnosis not present

## 2018-02-09 ENCOUNTER — Other Ambulatory Visit: Payer: Self-pay | Admitting: Internal Medicine

## 2018-02-15 ENCOUNTER — Other Ambulatory Visit: Payer: Self-pay | Admitting: Internal Medicine

## 2018-03-22 ENCOUNTER — Ambulatory Visit (INDEPENDENT_AMBULATORY_CARE_PROVIDER_SITE_OTHER): Payer: Medicare Other | Admitting: Internal Medicine

## 2018-03-22 ENCOUNTER — Encounter: Payer: Self-pay | Admitting: Internal Medicine

## 2018-03-22 VITALS — BP 100/58 | HR 65 | Temp 98.4°F | Wt 166.0 lb

## 2018-03-22 DIAGNOSIS — N200 Calculus of kidney: Secondary | ICD-10-CM | POA: Diagnosis not present

## 2018-03-22 DIAGNOSIS — E785 Hyperlipidemia, unspecified: Secondary | ICD-10-CM | POA: Diagnosis not present

## 2018-03-22 DIAGNOSIS — E119 Type 2 diabetes mellitus without complications: Secondary | ICD-10-CM

## 2018-03-22 DIAGNOSIS — I10 Essential (primary) hypertension: Secondary | ICD-10-CM | POA: Diagnosis not present

## 2018-03-22 LAB — POCT GLYCOSYLATED HEMOGLOBIN (HGB A1C): HEMOGLOBIN A1C: 6.2 % — AB (ref 4.0–5.6)

## 2018-03-22 MED ORDER — HYDROCHLOROTHIAZIDE 25 MG PO TABS: 12.5000 mg | ORAL_TABLET | Freq: Every day | ORAL | 3 refills | Status: DC

## 2018-03-22 MED ORDER — SIMVASTATIN 20 MG PO TABS: 20.0000 mg | ORAL_TABLET | Freq: Every day | ORAL | 4 refills | Status: DC

## 2018-03-22 MED ORDER — METFORMIN HCL 500 MG PO TABS: ORAL_TABLET | ORAL | 4 refills | Status: DC

## 2018-03-22 NOTE — Patient Instructions (Addendum)
Please check your hemoglobin A1c every 3-6  Months  Return in 6 months for follow-up  Smoking tobacco is very bad for your health. You should stop smoking immediately.

## 2018-03-22 NOTE — Progress Notes (Signed)
Subjective:    Patient ID: [name], male    DOB: 19-Jul-1941, 77 y.o.   MRN: [id number]  HPI  77 year old patient who is seen today for follow-up of type 2 diabetes as well as essential hypertension. He is on metformin therapy only.  Hemoglobin A1c today 6.3 He is doing well but having some issues with a partial rotator cuff tear.  Remains active with golf.  Still smokes a few cigarettes while playing golf Has history mild hypertension and remains on low-dose diuretic therapy also for kidney stone prophylaxis He is scheduled to see urology in follow-up soon.  Remains on simvastatin  No new concerns or complaints  Past Medical History:  Diagnosis Date  . Bronchitis    hx of  . Chronic kidney disease    stone  . Cigarette smoker   . Colon polyp   . COPD (chronic obstructive pulmonary disease) ([place])    Pt reports does not have history of COPD  . Diverticulosis of colon   . DM (diabetes mellitus) ([place])   . Gastritis   . GERD (gastroesophageal reflux disease)   . Hemorrhoids   . Hiatal hernia   . History of BPH   . History of renal calculi   . HTN (hypertension)    pt states med is for kidney stones and not high bp.   . Hyperlipidemia   . IBS (irritable bowel syndrome)   . Lumbar back pain   . Neck pain   . Shortness of breath      Social History   Socioeconomic History  . Marital status: Married    Spouse name: Not on file  . Number of children: 2  . Years of education: Not on file  . Highest education level: Not on file  Occupational History  . Occupation: retired from [employer]  . Financial resource strain: Not on file  . Food insecurity:    Worry: Not on file    Inability: Not on file  . Transportation needs:    Medical: Not on file    Non-medical: Not on file  Tobacco Use  . Smoking status: Former Smoker    Packs/day: 0.25    Years: 35.00    Pack years: 8.75    Last attempt to quit: 06/18/2016    Years since quitting: 1.7  .  Smokeless tobacco: Never Used  . Tobacco comment: 1-3 cigarettes daily when golfing----Counseling given in exam room to quit smoking   Substance and Sexual Activity  . Alcohol use: Yes    Alcohol/week: 1.5 oz    Types: 3 Standard drinks or equivalent per week    Comment: social  . Drug use: No  . Sexual activity: Not on file  Lifestyle  . Physical activity:    Days per week: Not on file    Minutes per session: Not on file  . Stress: Not on file  Relationships  . Social connections:    Talks on phone: Not on file    Gets together: Not on file    Attends religious service: Not on file    Active member of club or organization: Not on file    Attends meetings of clubs or organizations: Not on file    Relationship status: Not on file  . Intimate partner violence:    Fear of current or ex partner: Not on file    Emotionally abused: Not on file    Physically abused: Not on file  Forced sexual activity: Not on file  Other Topics Concern  . Not on file  Social History Narrative  . Not on file    Past Surgical History:  Procedure Laterality Date  . ANTERIOR LAT LUMBAR FUSION  08/14/2012   Procedure: ANTERIOR LATERAL LUMBAR FUSION 1 LEVEL;  Surgeon: Charlie Pitter, MD;  Location: [place] [facility];  Service: Neurosurgery;  Laterality: Left;  Left lumbar three-four extreme lumbar interbody fusion with percutaneous pedicle screws   . BACK SURGERY     x 3  . [place]  . COLONOSCOPY W/ BIOPSIES AND POLYPECTOMY  05/06/2009   adenomous polyp, diverticulosis, external hemorrhoids  . CYSTOSCOPY  11/03   TURP and stone removal - Dr Amalia Hailey  . dental implants  2007  . LITHOTRIPSY     x 3  . LUMBAR MICRODISCECTOMY  12/07   L3-4 by Dr Annette Stable  . LUMBAR PERCUTANEOUS PEDICLE SCREW 1 LEVEL  08/14/2012   Procedure: LUMBAR PERCUTANEOUS PEDICLE SCREW 1 LEVEL;  Surgeon: Charlie Pitter, MD;  Location: [place] [facility];  Service: Neurosurgery;  Laterality: Left;  Left lumbar three-four  extreme lumbar interbody fusion with percutaneous pedicle screws   . ROTATOR CUFF REPAIR     left shoulder  . UPPER GASTROINTESTINAL ENDOSCOPY  09/07/2010   erosive gastritis, GERD  . UVULOPALATOPHARYNGOPLASTY  2001   Dr Wilburn Cornelia    Family History  Problem Relation Age of Onset  . Diabetes Father   . Colon cancer Mother        ? she had met dis and chemoRx  . Esophageal cancer Neg Hx   . Rectal cancer Neg Hx   . Stomach cancer Neg Hx     No Known Allergies  Current Outpatient Medications on File Prior to Visit  Medication Sig Dispense Refill  . acyclovir (ZOVIRAX) 400 MG tablet Take 1 tablet (400 mg total) by mouth 2 (two) times daily. 180 tablet 1  . aspirin 81 MG tablet Take 81 mg by mouth daily.      Marland Kitchen HYDROcodone-acetaminophen (NORCO/VICODIN) 5-325 MG tablet TK 1 TO 2 TS PO Q 6 H PRF PAIN  0  . potassium citrate (UROCIT-K) 10 MEQ (1080 MG) SR tablet     . sildenafil (VIAGRA) 100 MG tablet TAKE 1 TABLET BY MOUTH EVERY DAY AS NEEDED 10 tablet 0   No current facility-administered medications on file prior to visit.     BP (!) 100/58 (BP Location: Right Arm, Patient Position: Sitting, Cuff Size: Large)   Pulse 65   Temp 98.4 F (36.9 C) (Oral)   Wt 166 lb (75.3 kg)   SpO2 97%   BMI 23.48 kg/m     Review of Systems  Constitutional: Negative for appetite change, chills, fatigue and fever.  HENT: Negative for congestion, dental problem, ear pain, hearing loss, sore throat, tinnitus, trouble swallowing and voice change.   Eyes: Negative for pain, discharge and visual disturbance.  Respiratory: Negative for cough, chest tightness, wheezing and stridor.   Cardiovascular: Negative for chest pain, palpitations and leg swelling.  Gastrointestinal: Negative for abdominal distention, abdominal pain, blood in stool, constipation, diarrhea, nausea and vomiting.  Genitourinary: Negative for difficulty urinating, discharge, flank pain, genital sores, hematuria and urgency.    Musculoskeletal: Negative for arthralgias, back pain, gait problem, joint swelling, myalgias and neck stiffness.  Skin: Negative for rash.  Neurological: Negative for dizziness, syncope, speech difficulty, weakness, numbness and headaches.  Hematological: Negative for adenopathy. Does not bruise/bleed  easily.  Psychiatric/Behavioral: Negative for behavioral problems and dysphoric mood. The patient is not nervous/anxious.        Objective:   Physical Exam  Constitutional: He is oriented to person, place, and time. He appears well-developed and well-nourished.  Appears fit and deeply tanned.  Blood pressure low normal  HENT:  Head: Normocephalic.  Right Ear: External ear normal.  Left Ear: External ear normal.  Eyes: Conjunctivae and EOM are normal.  Neck: Normal range of motion.  Cardiovascular: Normal rate and normal heart sounds.  Pulmonary/Chest: Breath sounds normal.  Abdominal: Bowel sounds are normal.  Musculoskeletal: Normal range of motion. He exhibits no edema or tenderness.  Neurological: He is alert and oriented to person, place, and time.  Psychiatric: He has a normal mood and affect. His behavior is normal.          Assessment & Plan:   Diabetes mellitus type 2.  Well controlled with metformin therapy Essential hypertension stable Dyslipidemia continue statin therapy Nephrolithiasis.  Follow-up urology as scheduled  CPX with new provider in 6 months Medications updated  Nyoka Cowden

## 2018-03-23 DIAGNOSIS — Z87442 Personal history of urinary calculi: Secondary | ICD-10-CM | POA: Diagnosis not present

## 2018-03-23 DIAGNOSIS — N400 Enlarged prostate without lower urinary tract symptoms: Secondary | ICD-10-CM | POA: Diagnosis not present

## 2018-03-23 DIAGNOSIS — N5201 Erectile dysfunction due to arterial insufficiency: Secondary | ICD-10-CM | POA: Diagnosis not present

## 2018-05-16 ENCOUNTER — Encounter: Payer: Self-pay | Admitting: Internal Medicine

## 2018-05-16 DIAGNOSIS — Z86018 Personal history of other benign neoplasm: Secondary | ICD-10-CM | POA: Diagnosis not present

## 2018-05-16 DIAGNOSIS — Z808 Family history of malignant neoplasm of other organs or systems: Secondary | ICD-10-CM | POA: Diagnosis not present

## 2018-05-16 DIAGNOSIS — L821 Other seborrheic keratosis: Secondary | ICD-10-CM | POA: Diagnosis not present

## 2018-05-16 DIAGNOSIS — L570 Actinic keratosis: Secondary | ICD-10-CM | POA: Diagnosis not present

## 2018-05-16 DIAGNOSIS — D485 Neoplasm of uncertain behavior of skin: Secondary | ICD-10-CM | POA: Diagnosis not present

## 2018-05-16 DIAGNOSIS — C4442 Squamous cell carcinoma of skin of scalp and neck: Secondary | ICD-10-CM | POA: Diagnosis not present

## 2018-05-28 ENCOUNTER — Other Ambulatory Visit: Payer: Self-pay | Admitting: Internal Medicine

## 2018-06-05 ENCOUNTER — Encounter: Payer: Self-pay | Admitting: Internal Medicine

## 2018-06-22 DIAGNOSIS — C4442 Squamous cell carcinoma of skin of scalp and neck: Secondary | ICD-10-CM | POA: Diagnosis not present

## 2018-06-28 ENCOUNTER — Ambulatory Visit (INDEPENDENT_AMBULATORY_CARE_PROVIDER_SITE_OTHER): Payer: Medicare Other

## 2018-06-28 DIAGNOSIS — Z23 Encounter for immunization: Secondary | ICD-10-CM | POA: Diagnosis not present

## 2018-09-07 ENCOUNTER — Other Ambulatory Visit: Payer: Self-pay

## 2018-09-13 ENCOUNTER — Encounter: Payer: Self-pay | Admitting: Internal Medicine

## 2018-09-13 ENCOUNTER — Ambulatory Visit (INDEPENDENT_AMBULATORY_CARE_PROVIDER_SITE_OTHER): Payer: Medicare Other | Admitting: Internal Medicine

## 2018-09-13 VITALS — BP 110/76 | HR 58 | Temp 97.7°F | Wt 170.4 lb

## 2018-09-13 DIAGNOSIS — I10 Essential (primary) hypertension: Secondary | ICD-10-CM

## 2018-09-13 DIAGNOSIS — E119 Type 2 diabetes mellitus without complications: Secondary | ICD-10-CM

## 2018-09-13 DIAGNOSIS — E785 Hyperlipidemia, unspecified: Secondary | ICD-10-CM | POA: Diagnosis not present

## 2018-09-13 LAB — POCT GLYCOSYLATED HEMOGLOBIN (HGB A1C): Hemoglobin A1C: 6.5 % — AB (ref 4.0–5.6)

## 2018-09-13 NOTE — Patient Instructions (Signed)
-  It was nice to meet you!  -Your A1c is good today at 6.5.  -Stop taking both hydrochlorothiazide and potassium citrate.  -Please schedule follow-up with me in 6 months for diabetes, blood pressure follow-up.

## 2018-09-13 NOTE — Progress Notes (Signed)
Established Patient Office Visit     CC/Reason for Visit: Establish care, follow-up chronic medical conditions  HPI: [name] is a 77 y.o. male who is coming in today for the above mentioned reasons. Past Medical History is significant for: Diabetes, questionable history of hypertension, prior history of kidney stones (he states he has been prescribed hydrochlorothiazide for this reason?).  He has no acute complaints today.  His hypertension has been well controlled, his diabetes has been well controlled with prior hemoglobin A1c of 6.3 in June.  He tells me that he would like to try and get off of the hydrochlorothiazide and potassium tablets.  He leads a very healthy lifestyle but admits to eating a little bit more sweets around the holidays.   Past Medical/Surgical History: Past Medical History:  Diagnosis Date  . Bronchitis    hx of  . Chronic kidney disease    stone  . Cigarette smoker   . Colon polyp   . COPD (chronic obstructive pulmonary disease) ([place])    Pt reports does not have history of COPD  . Diverticulosis of colon   . DM (diabetes mellitus) ([place])   . Gastritis   . GERD (gastroesophageal reflux disease)   . Hemorrhoids   . Hiatal hernia   . History of BPH   . History of renal calculi   . HTN (hypertension)    pt states med is for kidney stones and not high bp.   . Hyperlipidemia   . IBS (irritable bowel syndrome)   . Lumbar back pain   . Neck pain   . Shortness of breath     Past Surgical History:  Procedure Laterality Date  . ANTERIOR LAT LUMBAR FUSION  08/14/2012   Procedure: ANTERIOR LATERAL LUMBAR FUSION 1 LEVEL;  Surgeon: Charlie Pitter, MD;  Location: [facility];  Service: Neurosurgery;  Laterality: Left;  Left lumbar three-four extreme lumbar interbody fusion with percutaneous pedicle screws   . BACK SURGERY     x 3  . [place]  . COLONOSCOPY W/ BIOPSIES AND POLYPECTOMY  05/06/2009   adenomous polyp,  diverticulosis, external hemorrhoids  . CYSTOSCOPY  11/03   TURP and stone removal - Dr Amalia Hailey  . dental implants  2007  . LITHOTRIPSY     x 3  . LUMBAR MICRODISCECTOMY  12/07   L3-4 by Dr Annette Stable  . LUMBAR PERCUTANEOUS PEDICLE SCREW 1 LEVEL  08/14/2012   Procedure: LUMBAR PERCUTANEOUS PEDICLE SCREW 1 LEVEL;  Surgeon: Charlie Pitter, MD;  Location: [facility];  Service: Neurosurgery;  Laterality: Left;  Left lumbar three-four extreme lumbar interbody fusion with percutaneous pedicle screws   . ROTATOR CUFF REPAIR     left shoulder  . UPPER GASTROINTESTINAL ENDOSCOPY  09/07/2010   erosive gastritis, GERD  . UVULOPALATOPHARYNGOPLASTY  2001   Dr Wilburn Cornelia    Social History:  reports that he quit smoking about 2 years ago. He has a 8.75 pack-year smoking history. He has never used smokeless tobacco. He reports that he drinks about 3.0 standard drinks of alcohol per week. He reports that he does not use drugs.  Allergies: No Known Allergies  Family History:  Family History  Problem Relation Age of Onset  . Diabetes Father   . Colon cancer Mother        ? she had met dis and chemoRx  . Esophageal cancer Neg Hx   . Rectal cancer Neg Hx   .  Stomach cancer Neg Hx      Current Outpatient Medications:  .  acyclovir (ZOVIRAX) 400 MG tablet, Take 1 tablet (400 mg total) by mouth 2 (two) times daily., Disp: 180 tablet, Rfl: 1 .  aspirin 81 MG tablet, Take 81 mg by mouth daily.  , Disp: , Rfl:  .  metFORMIN (GLUCOPHAGE) 500 MG tablet, TAKE 1 TABLET BY MOUTH DAILY WITH A MEAL, Disp: 90 tablet, Rfl: 4 .  simvastatin (ZOCOR) 20 MG tablet, Take 1 tablet (20 mg total) by mouth at bedtime., Disp: 90 tablet, Rfl: 4  Review of Systems:  Constitutional: Denies fever, chills, diaphoresis, appetite change and fatigue.  HEENT: Denies photophobia, eye pain, redness, hearing loss, ear pain, congestion, sore throat, rhinorrhea, sneezing, mouth sores, trouble swallowing, neck pain, neck stiffness and  tinnitus.   Respiratory: Denies SOB, DOE, cough, chest tightness,  and wheezing.   Cardiovascular: Denies chest pain, palpitations and leg swelling.  Gastrointestinal: Denies nausea, vomiting, abdominal pain, diarrhea, constipation, blood in stool and abdominal distention.  Genitourinary: Denies dysuria, urgency, frequency, hematuria, flank pain and difficulty urinating.  Endocrine: Denies: hot or cold intolerance, sweats, changes in hair or nails, polyuria, polydipsia. Musculoskeletal: Denies myalgias, back pain, joint swelling, arthralgias and gait problem.  Skin: Denies pallor, rash and wound.  Neurological: Denies dizziness, seizures, syncope, weakness, light-headedness, numbness and headaches.  Hematological: Denies adenopathy. Easy bruising, personal or family bleeding history  Psychiatric/Behavioral: Denies suicidal ideation, mood changes, confusion, nervousness, sleep disturbance and agitation    Physical Exam: Vitals:   09/13/18 0934  BP: 110/76  Pulse: (!) 58  Temp: 97.7 F (36.5 C)  TempSrc: Oral  SpO2: 95%  Weight: 170 lb 6.4 oz (77.3 kg)    Body mass index is 24.1 kg/m.   Constitutional: NAD, calm, comfortable Eyes: PERRL, lids and conjunctivae normal ENMT: Mucous membranes are moist. Posterior pharynx clear of any exudate or lesions. Normal dentition.  Neck: normal, supple, no masses, no thyromegaly Respiratory: clear to auscultation bilaterally, no wheezing, no crackles. Normal respiratory effort. No accessory muscle use.  Cardiovascular: Regular rate and rhythm, no murmurs / rubs / gallops. No extremity edema. 2+ pedal pulses. No carotid bruits.  Abdomen: no tenderness, no masses palpated. No hepatosplenomegaly. Bowel sounds positive.  Musculoskeletal: no clubbing / cyanosis. No joint deformity upper and lower extremities. Good ROM, no contractures. Normal muscle tone.  Skin: no rashes, lesions, ulcers. No induration Neurologic: Grossly intact and  nonfocal Psychiatric: Normal judgment and insight. Alert and oriented x 3. Normal mood.    Impression and Plan:  Diabetes mellitus without complication ([place]) - Plan: POCT glycosylated hemoglobin (Hb A1C)  Lab Results  Component Value Date   HGBA1C 6.5 (A) 09/13/2018   -A1c today continues to demonstrate good control at 6.5. -Continue metformin 500 mg daily.  Dyslipidemia -On simvastatin 20 mg. -LDL in January 2019 was 65 demonstrating excellent control.  Essential hypertension -Unclear if he truly has history of hypertension, he states his hydrochlorothiazide was prescribed for kidney stones. -As he wishes to discontinue hydrochlorothiazide we will stop and have him follow-up in 6 months.    Patient Instructions  -It was nice to meet you!  -Your A1c is good today at 6.5.  -Stop taking both hydrochlorothiazide and potassium citrate.  -Please schedule follow-up with me in 6 months for diabetes, blood pressure follow-up.     Lelon Frohlich, MD [facility]

## 2018-10-06 DIAGNOSIS — J069 Acute upper respiratory infection, unspecified: Secondary | ICD-10-CM | POA: Diagnosis not present

## 2018-10-06 DIAGNOSIS — J029 Acute pharyngitis, unspecified: Secondary | ICD-10-CM | POA: Diagnosis not present

## 2018-10-06 DIAGNOSIS — M791 Myalgia, unspecified site: Secondary | ICD-10-CM | POA: Diagnosis not present

## 2019-03-14 ENCOUNTER — Ambulatory Visit (INDEPENDENT_AMBULATORY_CARE_PROVIDER_SITE_OTHER): Payer: Medicare Other | Admitting: Internal Medicine

## 2019-03-14 ENCOUNTER — Other Ambulatory Visit: Payer: Self-pay

## 2019-03-14 ENCOUNTER — Encounter: Payer: Self-pay | Admitting: Internal Medicine

## 2019-03-14 VITALS — BP 130/70 | HR 74 | Temp 98.2°F | Wt 170.5 lb

## 2019-03-14 DIAGNOSIS — A6000 Herpesviral infection of urogenital system, unspecified: Secondary | ICD-10-CM

## 2019-03-14 DIAGNOSIS — E119 Type 2 diabetes mellitus without complications: Secondary | ICD-10-CM | POA: Diagnosis not present

## 2019-03-14 DIAGNOSIS — E785 Hyperlipidemia, unspecified: Secondary | ICD-10-CM

## 2019-03-14 DIAGNOSIS — I10 Essential (primary) hypertension: Secondary | ICD-10-CM

## 2019-03-14 LAB — POCT GLYCOSYLATED HEMOGLOBIN (HGB A1C): Hemoglobin A1C: 6.6 % — AB (ref 4.0–5.6)

## 2019-03-14 MED ORDER — ACYCLOVIR 400 MG PO TABS: 400.0000 mg | ORAL_TABLET | Freq: Two times a day (BID) | ORAL | 1 refills | Status: DC

## 2019-03-14 MED ORDER — SIMVASTATIN 20 MG PO TABS: 20.0000 mg | ORAL_TABLET | Freq: Every day | ORAL | 1 refills | Status: DC

## 2019-03-14 MED ORDER — METFORMIN HCL 500 MG PO TABS: ORAL_TABLET | ORAL | 1 refills | Status: DC

## 2019-03-14 NOTE — Patient Instructions (Signed)
-  Nice seeing you today!  -Schedule follow up in 3-4 months with me for your medicare wellness visit. Please come in fasting that day.

## 2019-03-14 NOTE — Progress Notes (Signed)
Established Patient Office Visit     CC/Reason for Visit: Follow up chronic conditions  HPI: [name] is a 78 y.o. male who is coming in today for the above mentioned reasons. Past Medical History is significant for: DM 2, HTN, HLD that have been well controlled. Last visit he wanted to get off HCTZ, which we have done successfully. He has no acute complaints today. He requests refills of all meds today.   Past Medical/Surgical History: Past Medical History:  Diagnosis Date  . Bronchitis    hx of  . Chronic kidney disease    stone  . Cigarette smoker   . Colon polyp   . COPD (chronic obstructive pulmonary disease) ([place])    Pt reports does not have history of COPD  . Diverticulosis of colon   . DM (diabetes mellitus) ([place])   . Gastritis   . GERD (gastroesophageal reflux disease)   . Hemorrhoids   . Hiatal hernia   . History of BPH   . History of renal calculi   . HTN (hypertension)    pt states med is for kidney stones and not high bp.   . Hyperlipidemia   . IBS (irritable bowel syndrome)   . Lumbar back pain   . Neck pain   . Shortness of breath     Past Surgical History:  Procedure Laterality Date  . ANTERIOR LAT LUMBAR FUSION  08/14/2012   Procedure: ANTERIOR LATERAL LUMBAR FUSION 1 LEVEL;  Surgeon: Charlie Pitter, MD;  Location: [facility];  Service: Neurosurgery;  Laterality: Left;  Left lumbar three-four extreme lumbar interbody fusion with percutaneous pedicle screws   . BACK SURGERY     x 3  . [place]  . COLONOSCOPY W/ BIOPSIES AND POLYPECTOMY  05/06/2009   adenomous polyp, diverticulosis, external hemorrhoids  . CYSTOSCOPY  11/03   TURP and stone removal - Dr Amalia Hailey  . dental implants  2007  . LITHOTRIPSY     x 3  . LUMBAR MICRODISCECTOMY  12/07   L3-4 by Dr Annette Stable  . LUMBAR PERCUTANEOUS PEDICLE SCREW 1 LEVEL  08/14/2012   Procedure: LUMBAR PERCUTANEOUS PEDICLE SCREW 1 LEVEL;  Surgeon: Charlie Pitter, MD;  Location:  [place];  Service: Neurosurgery;  Laterality: Left;  Left lumbar three-four extreme lumbar interbody fusion with percutaneous pedicle screws   . ROTATOR CUFF REPAIR     left shoulder  . UPPER GASTROINTESTINAL ENDOSCOPY  09/07/2010   erosive gastritis, GERD  . UVULOPALATOPHARYNGOPLASTY  2001   Dr Wilburn Cornelia    Social History:  reports that he quit smoking about 2 years ago. He has a 8.75 pack-year smoking history. He has never used smokeless tobacco. He reports current alcohol use of about 3.0 standard drinks of alcohol per week. He reports that he does not use drugs.  Allergies: No Known Allergies  Family History:  Family History  Problem Relation Age of Onset  . Diabetes Father   . Colon cancer Mother        ? she had met dis and chemoRx  . Esophageal cancer Neg Hx   . Rectal cancer Neg Hx   . Stomach cancer Neg Hx      Current Outpatient Medications:  .  acyclovir (ZOVIRAX) 400 MG tablet, Take 1 tablet (400 mg total) by mouth 2 (two) times daily., Disp: 180 tablet, Rfl: 1 .  aspirin 81 MG tablet, Take 81 mg by mouth daily.  ,  Disp: , Rfl:  .  metFORMIN (GLUCOPHAGE) 500 MG tablet, TAKE 1 TABLET BY MOUTH DAILY WITH A MEAL, Disp: 90 tablet, Rfl: 1 .  simvastatin (ZOCOR) 20 MG tablet, Take 1 tablet (20 mg total) by mouth at bedtime., Disp: 90 tablet, Rfl: 1  Review of Systems:  Constitutional: Denies fever, chills, diaphoresis, appetite change and fatigue.  HEENT: Denies photophobia, eye pain, redness, hearing loss, ear pain, congestion, sore throat, rhinorrhea, sneezing, mouth sores, trouble swallowing, neck pain, neck stiffness and tinnitus.   Respiratory: Denies SOB, DOE, cough, chest tightness,  and wheezing.   Cardiovascular: Denies chest pain, palpitations and leg swelling.  Gastrointestinal: Denies nausea, vomiting, abdominal pain, diarrhea, constipation, blood in stool and abdominal distention.  Genitourinary: Denies dysuria, urgency, frequency, hematuria, flank pain  and difficulty urinating.  Endocrine: Denies: hot or cold intolerance, sweats, changes in hair or nails, polyuria, polydipsia. Musculoskeletal: Denies myalgias, back pain, joint swelling, arthralgias and gait problem.  Skin: Denies pallor, rash and wound.  Neurological: Denies dizziness, seizures, syncope, weakness, light-headedness, numbness and headaches.  Hematological: Denies adenopathy. Easy bruising, personal or family bleeding history  Psychiatric/Behavioral: Denies suicidal ideation, mood changes, confusion, nervousness, sleep disturbance and agitation    Physical Exam: Vitals:   03/14/19 0756  BP: 130/70  Pulse: 74  Temp: 98.2 F (36.8 C)  TempSrc: Oral  SpO2: 96%  Weight: 170 lb 8 oz (77.3 kg)    Body mass index is 24.12 kg/m.   Constitutional: NAD, calm, comfortable Eyes: PERRL, lids and conjunctivae normal, wears corrective lenses ENMT: Mucous membranes are moist.  Neck: normal, supple, no masses, no thyromegaly Respiratory: clear to auscultation bilaterally, no wheezing, no crackles. Normal respiratory effort. No accessory muscle use.  Cardiovascular: Regular rate and rhythm, no murmurs / rubs / gallops. No extremity edema. 2+ pedal pulses. No carotid bruits.  Abdomen: no tenderness, no masses palpated. No hepatosplenomegaly. Bowel sounds positive.  Musculoskeletal: no clubbing / cyanosis. No joint deformity upper and lower extremities. Good ROM, no contractures. Normal muscle tone.  Psychiatric: Normal judgment and insight. Alert and oriented x 3. Normal mood.    Impression and Plan:  Diabetes mellitus without complication ([facility])  -N1A demonstrates good control at 6.6 today. -Continue metformin.  Dyslipidemia  -Refill statin. LDL 65 in 1/19. -Check lipids next visit with CPE.  Essential hypertension -Well controlled off all meds.  Genital herpes simplex, unspecified site  -No current outbreak. -Will refill acyclovir today.   Patient Instructions   -Nice seeing you today!  -Schedule follow up in 3-4 months with me for your medicare wellness visit. Please come in fasting that day.       Lelon Frohlich, MD [facility]

## 2019-05-30 DIAGNOSIS — L570 Actinic keratosis: Secondary | ICD-10-CM | POA: Diagnosis not present

## 2019-05-30 DIAGNOSIS — L821 Other seborrheic keratosis: Secondary | ICD-10-CM | POA: Diagnosis not present

## 2019-05-30 DIAGNOSIS — Z808 Family history of malignant neoplasm of other organs or systems: Secondary | ICD-10-CM | POA: Diagnosis not present

## 2019-05-30 DIAGNOSIS — Z86018 Personal history of other benign neoplasm: Secondary | ICD-10-CM | POA: Diagnosis not present

## 2019-05-30 DIAGNOSIS — Z85828 Personal history of other malignant neoplasm of skin: Secondary | ICD-10-CM | POA: Diagnosis not present

## 2019-06-14 DIAGNOSIS — H43813 Vitreous degeneration, bilateral: Secondary | ICD-10-CM | POA: Diagnosis not present

## 2019-06-14 DIAGNOSIS — H524 Presbyopia: Secondary | ICD-10-CM | POA: Diagnosis not present

## 2019-06-14 DIAGNOSIS — E119 Type 2 diabetes mellitus without complications: Secondary | ICD-10-CM | POA: Diagnosis not present

## 2019-06-14 DIAGNOSIS — H35373 Puckering of macula, bilateral: Secondary | ICD-10-CM | POA: Diagnosis not present

## 2019-06-14 LAB — HM DIABETES EYE EXAM

## 2019-06-15 ENCOUNTER — Other Ambulatory Visit: Payer: Self-pay

## 2019-06-15 ENCOUNTER — Ambulatory Visit (INDEPENDENT_AMBULATORY_CARE_PROVIDER_SITE_OTHER): Payer: Medicare Other | Admitting: Internal Medicine

## 2019-06-15 ENCOUNTER — Encounter: Payer: Self-pay | Admitting: Internal Medicine

## 2019-06-15 VITALS — BP 110/76 | HR 72 | Temp 98.0°F | Ht 71.0 in | Wt 167.8 lb

## 2019-06-15 DIAGNOSIS — Z23 Encounter for immunization: Secondary | ICD-10-CM

## 2019-06-15 DIAGNOSIS — E119 Type 2 diabetes mellitus without complications: Secondary | ICD-10-CM | POA: Diagnosis not present

## 2019-06-15 DIAGNOSIS — A6000 Herpesviral infection of urogenital system, unspecified: Secondary | ICD-10-CM | POA: Diagnosis not present

## 2019-06-15 DIAGNOSIS — Z0000 Encounter for general adult medical examination without abnormal findings: Secondary | ICD-10-CM | POA: Diagnosis not present

## 2019-06-15 DIAGNOSIS — Z720 Tobacco use: Secondary | ICD-10-CM | POA: Diagnosis not present

## 2019-06-15 DIAGNOSIS — E559 Vitamin D deficiency, unspecified: Secondary | ICD-10-CM

## 2019-06-15 DIAGNOSIS — E785 Hyperlipidemia, unspecified: Secondary | ICD-10-CM | POA: Diagnosis not present

## 2019-06-15 DIAGNOSIS — E538 Deficiency of other specified B group vitamins: Secondary | ICD-10-CM | POA: Diagnosis not present

## 2019-06-15 DIAGNOSIS — Z136 Encounter for screening for cardiovascular disorders: Secondary | ICD-10-CM

## 2019-06-15 DIAGNOSIS — Z122 Encounter for screening for malignant neoplasm of respiratory organs: Secondary | ICD-10-CM | POA: Diagnosis not present

## 2019-06-15 LAB — CBC WITH DIFFERENTIAL/PLATELET
Basophils Absolute: 0 10*3/uL (ref 0.0–0.1)
Basophils Relative: 0.7 % (ref 0.0–3.0)
Eosinophils Absolute: 0.3 10*3/uL (ref 0.0–0.7)
Eosinophils Relative: 4.6 % (ref 0.0–5.0)
HCT: 42.2 % (ref 39.0–52.0)
Hemoglobin: 14.3 g/dL (ref 13.0–17.0)
Lymphocytes Relative: 27.9 % (ref 12.0–46.0)
Lymphs Abs: 1.8 10*3/uL (ref 0.7–4.0)
MCHC: 33.9 g/dL (ref 30.0–36.0)
MCV: 93.9 fl (ref 78.0–100.0)
Monocytes Absolute: 0.5 10*3/uL (ref 0.1–1.0)
Monocytes Relative: 7.6 % (ref 3.0–12.0)
Neutro Abs: 3.7 10*3/uL (ref 1.4–7.7)
Neutrophils Relative %: 59.2 % (ref 43.0–77.0)
Platelets: 216 10*3/uL (ref 150.0–400.0)
RBC: 4.5 Mil/uL (ref 4.22–5.81)
RDW: 13.1 % (ref 11.5–15.5)
WBC: 6.3 10*3/uL (ref 4.0–10.5)

## 2019-06-15 LAB — COMPREHENSIVE METABOLIC PANEL
ALT: 14 U/L (ref 0–53)
AST: 15 U/L (ref 0–37)
Albumin: 4.2 g/dL (ref 3.5–5.2)
Alkaline Phosphatase: 81 U/L (ref 39–117)
BUN: 21 mg/dL (ref 6–23)
CO2: 28 mEq/L (ref 19–32)
Calcium: 9.3 mg/dL (ref 8.4–10.5)
Chloride: 105 mEq/L (ref 96–112)
Creatinine, Ser: 1.11 mg/dL (ref 0.40–1.50)
GFR: 64.45 mL/min (ref >60.00)
Glucose, Bld: 117 mg/dL — ABNORMAL HIGH (ref 70–99)
Potassium: 4.5 mEq/L (ref 3.5–5.1)
Sodium: 140 mEq/L (ref 135–145)
Total Bilirubin: 0.6 mg/dL (ref 0.2–1.2)
Total Protein: 6.1 g/dL (ref 6.0–8.3)

## 2019-06-15 LAB — VITAMIN B12: Vitamin B-12: 256 pg/mL (ref 211–911)

## 2019-06-15 LAB — LIPID PANEL
Cholesterol: 121 mg/dL (ref 0–200)
HDL: 46.2 mg/dL (ref >39.00)
LDL Cholesterol: 60 mg/dL (ref 0–99)
NonHDL: 74.8
Total CHOL/HDL Ratio: 3
Triglycerides: 75 mg/dL (ref 0.0–149.0)
VLDL: 15 mg/dL (ref 0.0–40.0)

## 2019-06-15 LAB — HEMOGLOBIN A1C: Hgb A1c MFr Bld: 6.8 % — ABNORMAL HIGH (ref 4.6–6.5)

## 2019-06-15 LAB — TSH: TSH: 3.99 u[IU]/mL (ref 0.35–4.50)

## 2019-06-15 LAB — VITAMIN D 25 HYDROXY (VIT D DEFICIENCY, FRACTURES): VITD: 48.95 ng/mL (ref 30.00–100.00)

## 2019-06-15 MED ORDER — ACYCLOVIR 400 MG PO TABS: 400.0000 mg | ORAL_TABLET | Freq: Two times a day (BID) | ORAL | 1 refills | Status: DC

## 2019-06-15 MED ORDER — METFORMIN HCL 500 MG PO TABS: ORAL_TABLET | ORAL | 1 refills | Status: DC

## 2019-06-15 MED ORDER — SIMVASTATIN 20 MG PO TABS: 20.0000 mg | ORAL_TABLET | Freq: Every day | ORAL | 1 refills | Status: DC

## 2019-06-15 NOTE — Addendum Note (Signed)
Addended by: Westley Hummer B on: 06/15/2019 04:25 PM   Modules accepted: Orders

## 2019-06-15 NOTE — Progress Notes (Signed)
Established Patient Office Visit     CC/Reason for Visit: Annual preventive exam and subsequent Medicare wellness visit  HPI: [name] is a 78 y.o. male who is coming in today for the above mentioned reasons. Past Medical History is significant for: Hypertension, hyperlipidemia and type 2 diabetes that has been well controlled.  He has no acute complaints today.  He has routine eye and dental care and in fact saw both of them this week, he was told he did not have diabetic retinopathy, we have not received a report yet.  He had a colonoscopy in 2014 and was asked to return in 5 years due to his mother having colon cancer.  He remains physically active he plays golf 3 times a week and has a home gym.   Past Medical/Surgical History: Past Medical History:  Diagnosis Date  . Bronchitis    hx of  . Chronic kidney disease    stone  . Cigarette smoker   . Colon polyp   . COPD (chronic obstructive pulmonary disease) ([place])    Pt reports does not have history of COPD  . Diverticulosis of colon   . DM (diabetes mellitus) ([place])   . Gastritis   . GERD (gastroesophageal reflux disease)   . Hemorrhoids   . Hiatal hernia   . History of BPH   . History of renal calculi   . HTN (hypertension)    pt states med is for kidney stones and not high bp.   . Hyperlipidemia   . IBS (irritable bowel syndrome)   . Lumbar back pain   . Neck pain   . Shortness of breath     Past Surgical History:  Procedure Laterality Date  . ANTERIOR LAT LUMBAR FUSION  08/14/2012   Procedure: ANTERIOR LATERAL LUMBAR FUSION 1 LEVEL;  Surgeon: Charlie Pitter, MD;  Location: [facility];  Service: Neurosurgery;  Laterality: Left;  Left lumbar three-four extreme lumbar interbody fusion with percutaneous pedicle screws   . BACK SURGERY     x 3  . [place]  . COLONOSCOPY W/ BIOPSIES AND POLYPECTOMY  05/06/2009   adenomous polyp, diverticulosis, external hemorrhoids  . CYSTOSCOPY   11/03   TURP and stone removal - Dr Amalia Hailey  . dental implants  2007  . LITHOTRIPSY     x 3  . LUMBAR MICRODISCECTOMY  12/07   L3-4 by Dr Annette Stable  . LUMBAR PERCUTANEOUS PEDICLE SCREW 1 LEVEL  08/14/2012   Procedure: LUMBAR PERCUTANEOUS PEDICLE SCREW 1 LEVEL;  Surgeon: Charlie Pitter, MD;  Location: [facility];  Service: Neurosurgery;  Laterality: Left;  Left lumbar three-four extreme lumbar interbody fusion with percutaneous pedicle screws   . ROTATOR CUFF REPAIR     left shoulder  . UPPER GASTROINTESTINAL ENDOSCOPY  09/07/2010   erosive gastritis, GERD  . UVULOPALATOPHARYNGOPLASTY  2001   Dr Wilburn Cornelia    Social History:  reports that he quit smoking about 2 years ago. He has a 8.75 pack-year smoking history. He has never used smokeless tobacco. He reports current alcohol use of about 3.0 standard drinks of alcohol per week. He reports that he does not use drugs.  Allergies: No Known Allergies  Family History:  Family History  Problem Relation Age of Onset  . Diabetes Father   . Colon cancer Mother        ? she had met dis and chemoRx  . Esophageal cancer Neg Hx   .  Rectal cancer Neg Hx   . Stomach cancer Neg Hx      Current Outpatient Medications:  .  acyclovir (ZOVIRAX) 400 MG tablet, Take 1 tablet (400 mg total) by mouth 2 (two) times daily., Disp: 180 tablet, Rfl: 1 .  aspirin 81 MG tablet, Take 81 mg by mouth daily.  , Disp: , Rfl:  .  metFORMIN (GLUCOPHAGE) 500 MG tablet, TAKE 1 TABLET BY MOUTH DAILY WITH A MEAL, Disp: 90 tablet, Rfl: 1 .  simvastatin (ZOCOR) 20 MG tablet, Take 1 tablet (20 mg total) by mouth at bedtime., Disp: 90 tablet, Rfl: 1  Review of Systems:  Constitutional: Denies fever, chills, diaphoresis, appetite change and fatigue.  HEENT: Denies photophobia, eye pain, redness, hearing loss, ear pain, congestion, sore throat, rhinorrhea, sneezing, mouth sores, trouble swallowing, neck pain, neck stiffness and tinnitus.   Respiratory: Denies SOB, DOE, cough,  chest tightness,  and wheezing.   Cardiovascular: Denies chest pain, palpitations and leg swelling.  Gastrointestinal: Denies nausea, vomiting, abdominal pain, diarrhea, constipation, blood in stool and abdominal distention.  Genitourinary: Denies dysuria, urgency, frequency, hematuria, flank pain and difficulty urinating.  Endocrine: Denies: hot or cold intolerance, sweats, changes in hair or nails, polyuria, polydipsia. Musculoskeletal: Denies myalgias, back pain, joint swelling, arthralgias and gait problem.  Skin: Denies pallor, rash and wound.  Neurological: Denies dizziness, seizures, syncope, weakness, light-headedness, numbness and headaches.  Hematological: Denies adenopathy. Easy bruising, personal or family bleeding history  Psychiatric/Behavioral: Denies suicidal ideation, mood changes, confusion, nervousness, sleep disturbance and agitation    Physical Exam: Vitals:   06/15/19 0720  BP: 110/76  Pulse: 72  Temp: 98 F (36.7 C)  TempSrc: Temporal  SpO2: 94%  Weight: 167 lb 12.8 oz (76.1 kg)  Height: '5\' 11"'$  (1.803 m)    Body mass index is 23.4 kg/m.   Constitutional: NAD, calm, comfortable Eyes: PERRL, lids and conjunctivae normal, wears corrective lenses ENMT: Mucous membranes are moist. Tympanic membrane is pearly white, no erythema or bulging. Neck: normal, supple, no masses, no thyromegaly Respiratory: clear to auscultation bilaterally, no wheezing, no crackles. Normal respiratory effort. No accessory muscle use.  Cardiovascular: Regular rate and rhythm, no murmurs / rubs / gallops. No extremity edema. 2+ pedal pulses. No carotid bruits.  Abdomen: no tenderness, no masses palpated. No hepatosplenomegaly. Bowel sounds positive.  Musculoskeletal: no clubbing / cyanosis. No joint deformity upper and lower extremities. Good ROM, no contractures. Normal muscle tone.  Skin: no rashes, lesions, ulcers. No induration Neurologic: CN 2-12 grossly intact. Sensation intact,  DTR normal. Strength 5/5 in all 4.  Psychiatric: Normal judgment and insight. Alert and oriented x 3. Normal mood.    Subsequent Medicare wellness visit   1. Risk factors, based on past  M,S,F -cardiovascular disease risk factors include age, gender, history of diabetes, history of hypertension, history of hyperlipidemia   2.  Physical activities: He is quite physically active at his home gym and playing golf   3.  Depression/mood:  Mood is stable not depressed   4.  Hearing:  No perceived issues   5.  ADL's: Independent in all ADLs   6.  Fall risk:  Low fall risk   7.  Home safety: No problems identified   8.  Height weight, and visual acuity: Height and weight as above, visual acuity is 20/20 with both eyes independently and together with correction   9.  Counseling:  We have discussed pros and cons of multiple cancer screening modalities today.  10. Lab orders based on risk factors: Laboratory update will be reviewed   11. Referral :  None today   12. Care plan:  Follow-up with me in 4 months   13. Cognitive assessment:  No cognitive impairment   14. Screening: Patient provided with a written and personalized 5-10 year screening schedule in the AVS.   yes   15. Provider List Update:   PCP, ophthalmologist (unknown provider)  16. Advance Directives: Full code     Office Visit from 06/15/2019 in [facility]  PHQ-9 Total Score  0      Fall Risk  06/15/2019 09/13/2018 09/07/2018 05/13/2017 07/26/2016  Falls in the past year? 0 1 0 Yes No  Comment - - [facility] Telephone Survey: data to providers prior to load - -  Number falls in past yr: 0 1 - 1 -  Injury with Fall? 0 0 - Yes -  Risk for fall due to : - - - - -  Follow up - - - Education provided;Falls prevention discussed -     Impression and Plan:  Encounter for preventive health examination -Has routine eye and dental care. -Will receive flu vaccine in office today, will consider getting  shingles vaccination series at his pharmacy. -Healthy lifestyle has been discussed today. -Screening labs to be performed today -Will refer to GI as he is overdue for screening colonoscopy due to family history. -We discussed pros and cons of prostate cancer screening, and have elected to defer PSA and digital rectal exam at this time. -He is a current smoker, will send for AAA screening with abdominal ultrasound and lung cancer screening with low-dose CT scan.  Genital herpes simplex, unspecified site  - Plan: acyclovir (ZOVIRAX) 400 MG tablet  Diabetes mellitus without complication ([place])  -Most recent A1c was 6.6 in March 2020, redo today.  Dyslipidemia  -Last LDL was 65 in January 2019, recheck lipids today and refill statin.  Encounter for screening for malignant neoplasm of respiratory organs  -Order low-dose CT scan for lung cancer screening.  Encounter for abdominal aortic aneurysm (AAA) screening  - Plan: VAS US AORT[facility] MEDICARE SCREEN  Tobacco abuse -He used to be a much heavier smoker, now only smokes when playing golf, not interested in cessation at this time.    Patient Instructions  -Nice seeing you today!!  -Lab work today; will notify you once results are available.  -Flu vaccine today.  -Consider shingles vaccination series at your pharmacy.  -Abdominal US and CT scan of lungs will be requested as discussed.  -Schedule follow up in 4 months.   Preventive Care 12 Years and Older, Male Preventive care refers to lifestyle choices and visits with your health care provider that can promote health and wellness. This includes:  A yearly physical exam. This is also called an annual well check.  Regular dental and eye exams.  Immunizations.  Screening for certain conditions.  Healthy lifestyle choices, such as diet and exercise. What can I expect for my preventive care visit? Physical exam Your health care provider will check:  Height and weight. These  may be used to calculate body mass index (BMI), which is a measurement that tells if you are at a healthy weight.  Heart rate and blood pressure.  Your skin for abnormal spots. Counseling Your health care provider may ask you questions about:  Alcohol, tobacco, and drug use.  Emotional well-being.  Home and relationship well-being.  Sexual activity.  Eating habits.  History  of falls.  Memory and ability to understand (cognition).  Work and work Statistician. What immunizations do I need?  Influenza (flu) vaccine  This is recommended every year. Tetanus, diphtheria, and pertussis (Tdap) vaccine  You may need a Td booster every 10 years. Varicella (chickenpox) vaccine  You may need this vaccine if you have not already been vaccinated. Zoster (shingles) vaccine  You may need this after age 48. Pneumococcal conjugate (PCV13) vaccine  One dose is recommended after age 69. Pneumococcal polysaccharide (PPSV23) vaccine  One dose is recommended after age 91. Measles, mumps, and rubella (MMR) vaccine  You may need at least one dose of MMR if you were born in 1957 or later. You may also need a second dose. Meningococcal conjugate (MenACWY) vaccine  You may need this if you have certain conditions. Hepatitis A vaccine  You may need this if you have certain conditions or if you travel or work in places where you may be exposed to hepatitis A. Hepatitis B vaccine  You may need this if you have certain conditions or if you travel or work in places where you may be exposed to hepatitis B. Haemophilus influenzae type b (Hib) vaccine  You may need this if you have certain conditions. You may receive vaccines as individual doses or as more than one vaccine together in one shot (combination vaccines). Talk with your health care provider about the risks and benefits of combination vaccines. What tests do I need? Blood tests  Lipid and cholesterol levels. These may be checked  every 5 years, or more frequently depending on your overall health.  Hepatitis C test.  Hepatitis B test. Screening  Lung cancer screening. You may have this screening every year starting at age 58 if you have a 30-pack-year history of smoking and currently smoke or have quit within the past 15 years.  Colorectal cancer screening. All adults should have this screening starting at age 40 and continuing until age 82. Your health care provider may recommend screening at age 82 if you are at increased risk. You will have tests every 1-10 years, depending on your results and the type of screening test.  Prostate cancer screening. Recommendations will vary depending on your family history and other risks.  Diabetes screening. This is done by checking your blood sugar (glucose) after you have not eaten for a while (fasting). You may have this done every 1-3 years.  Abdominal aortic aneurysm (AAA) screening. You may need this if you are a current or former smoker.  Sexually transmitted disease (STD) testing. Follow these instructions at home: Eating and drinking  Eat a diet that includes fresh fruits and vegetables, whole grains, lean protein, and low-fat dairy products. Limit your intake of foods with high amounts of sugar, saturated fats, and salt.  Take vitamin and mineral supplements as recommended by your health care provider.  Do not drink alcohol if your health care provider tells you not to drink.  If you drink alcohol: ? Limit how much you have to 0-2 drinks a day. ? Be aware of how much alcohol is in your drink. In the [place] one drink equals one 12 oz bottle of beer (355 mL), one 5 oz glass of wine (148 mL), or one 1 oz glass of hard liquor (44 mL). Lifestyle  Take daily care of your teeth and gums.  Stay active. Exercise for at least 30 minutes on 5 or more days each week.  Do not use any products that contain  nicotine or tobacco, such as cigarettes, e-cigarettes, and chewing  tobacco. If you need help quitting, ask your health care provider.  If you are sexually active, practice safe sex. Use a condom or other form of protection to prevent STIs (sexually transmitted infections).  Talk with your health care provider about taking a low-dose aspirin or statin. What's next?  Visit your health care provider once a year for a well check visit.  Ask your health care provider how often you should have your eyes and teeth checked.  Stay up to date on all vaccines. This information is not intended to replace advice given to you by your health care provider. Make sure you discuss any questions you have with your health care provider. Document Released: 10/31/2015 Document Revised: 09/28/2018 Document Reviewed: 09/28/2018 Elsevier Patient Education  2020 [place], MD [facility]

## 2019-06-15 NOTE — Patient Instructions (Addendum)
-Nice seeing you today!!  -Lab work today; will notify you once results are available.  -Flu vaccine today.  -Consider shingles vaccination series at your pharmacy.  -Abdominal US and CT scan of lungs will be requested as discussed.  -Schedule follow up in 4 months.   Preventive Care 25 Years and Older, Male Preventive care refers to lifestyle choices and visits with your health care provider that can promote health and wellness. This includes:  A yearly physical exam. This is also called an annual well check.  Regular dental and eye exams.  Immunizations.  Screening for certain conditions.  Healthy lifestyle choices, such as diet and exercise. What can I expect for my preventive care visit? Physical exam Your health care provider will check:  Height and weight. These may be used to calculate body mass index (BMI), which is a measurement that tells if you are at a healthy weight.  Heart rate and blood pressure.  Your skin for abnormal spots. Counseling Your health care provider may ask you questions about:  Alcohol, tobacco, and drug use.  Emotional well-being.  Home and relationship well-being.  Sexual activity.  Eating habits.  History of falls.  Memory and ability to understand (cognition).  Work and work Statistician. What immunizations do I need?  Influenza (flu) vaccine  This is recommended every year. Tetanus, diphtheria, and pertussis (Tdap) vaccine  You may need a Td booster every 10 years. Varicella (chickenpox) vaccine  You may need this vaccine if you have not already been vaccinated. Zoster (shingles) vaccine  You may need this after age 18. Pneumococcal conjugate (PCV13) vaccine  One dose is recommended after age 22. Pneumococcal polysaccharide (PPSV23) vaccine  One dose is recommended after age 75. Measles, mumps, and rubella (MMR) vaccine  You may need at least one dose of MMR if you were born in 1957 or later. You may also need  a second dose. Meningococcal conjugate (MenACWY) vaccine  You may need this if you have certain conditions. Hepatitis A vaccine  You may need this if you have certain conditions or if you travel or work in places where you may be exposed to hepatitis A. Hepatitis B vaccine  You may need this if you have certain conditions or if you travel or work in places where you may be exposed to hepatitis B. Haemophilus influenzae type b (Hib) vaccine  You may need this if you have certain conditions. You may receive vaccines as individual doses or as more than one vaccine together in one shot (combination vaccines). Talk with your health care provider about the risks and benefits of combination vaccines. What tests do I need? Blood tests  Lipid and cholesterol levels. These may be checked every 5 years, or more frequently depending on your overall health.  Hepatitis C test.  Hepatitis B test. Screening  Lung cancer screening. You may have this screening every year starting at age 52 if you have a 30-pack-year history of smoking and currently smoke or have quit within the past 15 years.  Colorectal cancer screening. All adults should have this screening starting at age 24 and continuing until age 31. Your health care provider may recommend screening at age 66 if you are at increased risk. You will have tests every 1-10 years, depending on your results and the type of screening test.  Prostate cancer screening. Recommendations will vary depending on your family history and other risks.  Diabetes screening. This is done by checking your blood sugar (glucose) after you  have not eaten for a while (fasting). You may have this done every 1-3 years.  Abdominal aortic aneurysm (AAA) screening. You may need this if you are a current or former smoker.  Sexually transmitted disease (STD) testing. Follow these instructions at home: Eating and drinking  Eat a diet that includes fresh fruits and  vegetables, whole grains, lean protein, and low-fat dairy products. Limit your intake of foods with high amounts of sugar, saturated fats, and salt.  Take vitamin and mineral supplements as recommended by your health care provider.  Do not drink alcohol if your health care provider tells you not to drink.  If you drink alcohol: ? Limit how much you have to 0-2 drinks a day. ? Be aware of how much alcohol is in your drink. In the [place] one drink equals one 12 oz bottle of beer (355 mL), one 5 oz glass of wine (148 mL), or one 1 oz glass of hard liquor (44 mL). Lifestyle  Take daily care of your teeth and gums.  Stay active. Exercise for at least 30 minutes on 5 or more days each week.  Do not use any products that contain nicotine or tobacco, such as cigarettes, e-cigarettes, and chewing tobacco. If you need help quitting, ask your health care provider.  If you are sexually active, practice safe sex. Use a condom or other form of protection to prevent STIs (sexually transmitted infections).  Talk with your health care provider about taking a low-dose aspirin or statin. What's next?  Visit your health care provider once a year for a well check visit.  Ask your health care provider how often you should have your eyes and teeth checked.  Stay up to date on all vaccines. This information is not intended to replace advice given to you by your health care provider. Make sure you discuss any questions you have with your health care provider. Document Released: 10/31/2015 Document Revised: 09/28/2018 Document Reviewed: 09/28/2018 Elsevier Patient Education  2020 [employer].

## 2019-06-15 NOTE — Addendum Note (Signed)
Addended by: Elmer Picker on: 06/15/2019 08:14 AM   Modules accepted: Orders

## 2019-06-18 ENCOUNTER — Ambulatory Visit (AMBULATORY_SURGERY_CENTER): Payer: Self-pay | Admitting: *Deleted

## 2019-06-18 ENCOUNTER — Other Ambulatory Visit: Payer: Self-pay

## 2019-06-18 VITALS — Temp 97.0°F | Ht 71.0 in | Wt 170.0 lb

## 2019-06-18 DIAGNOSIS — Z86010 Personal history of colonic polyps: Secondary | ICD-10-CM

## 2019-06-18 NOTE — Progress Notes (Signed)
No egg or soy allergy known to patient  No issues with past sedation with any surgeries  or procedures, no intubation problems  No diet pills per patient No home 02 use per patient  No blood thinners per patient  Pt denies issues with constipation  No A fib or A flutter  EMMI video sent to pt's e mail  

## 2019-06-19 ENCOUNTER — Encounter: Payer: Self-pay | Admitting: Internal Medicine

## 2019-06-20 ENCOUNTER — Other Ambulatory Visit: Payer: Self-pay | Admitting: Internal Medicine

## 2019-06-20 DIAGNOSIS — Z720 Tobacco use: Secondary | ICD-10-CM

## 2019-06-20 DIAGNOSIS — Z122 Encounter for screening for malignant neoplasm of respiratory organs: Secondary | ICD-10-CM

## 2019-06-26 ENCOUNTER — Other Ambulatory Visit: Payer: Self-pay | Admitting: *Deleted

## 2019-06-26 DIAGNOSIS — Z87891 Personal history of nicotine dependence: Secondary | ICD-10-CM

## 2019-06-26 DIAGNOSIS — Z122 Encounter for screening for malignant neoplasm of respiratory organs: Secondary | ICD-10-CM

## 2019-07-02 ENCOUNTER — Other Ambulatory Visit: Payer: Self-pay

## 2019-07-02 ENCOUNTER — Encounter: Payer: Self-pay | Admitting: Internal Medicine

## 2019-07-02 ENCOUNTER — Ambulatory Visit (AMBULATORY_SURGERY_CENTER): Payer: Medicare Other | Admitting: Internal Medicine

## 2019-07-02 VITALS — BP 117/75 | HR 55 | Temp 98.3°F | Resp 13 | Ht 71.0 in | Wt 170.0 lb

## 2019-07-02 DIAGNOSIS — J449 Chronic obstructive pulmonary disease, unspecified: Secondary | ICD-10-CM | POA: Diagnosis not present

## 2019-07-02 DIAGNOSIS — D125 Benign neoplasm of sigmoid colon: Secondary | ICD-10-CM | POA: Diagnosis not present

## 2019-07-02 DIAGNOSIS — I10 Essential (primary) hypertension: Secondary | ICD-10-CM | POA: Diagnosis not present

## 2019-07-02 DIAGNOSIS — E119 Type 2 diabetes mellitus without complications: Secondary | ICD-10-CM | POA: Diagnosis not present

## 2019-07-02 DIAGNOSIS — D123 Benign neoplasm of transverse colon: Secondary | ICD-10-CM | POA: Diagnosis not present

## 2019-07-02 DIAGNOSIS — Z86010 Personal history of colonic polyps: Secondary | ICD-10-CM | POA: Diagnosis not present

## 2019-07-02 DIAGNOSIS — D120 Benign neoplasm of cecum: Secondary | ICD-10-CM

## 2019-07-02 MED ORDER — SODIUM CHLORIDE 0.9 % IV SOLN: 500.0000 mL | Freq: Once | INTRAVENOUS | Status: DC

## 2019-07-02 NOTE — Progress Notes (Signed)
Called to room to assist during endoscopic procedure.  Patient ID and intended procedure confirmed with present staff. Received instructions for my participation in the procedure from the performing physician.  

## 2019-07-02 NOTE — Op Note (Signed)
[facility] Patient Name: [name] Procedure Date: 07/02/2019 11:23 AM MRN: [id number] Endoscopist: Gatha Mayer , MD Age: 78 Referring MD:  Date of Birth: 1941/03/04 Gender: Male Account #: [id number] Procedure:                Colonoscopy Indications:              Surveillance: Personal history of adenomatous                            polyps on last colonoscopy > 5 years ago Medicines:                Propofol per Anesthesia, Monitored Anesthesia Care Procedure:                Pre-Anesthesia Assessment:                           - Prior to the procedure, a History and Physical                            was performed, and patient medications and                            allergies were reviewed. The patient's tolerance of                            previous anesthesia was also reviewed. The risks                            and benefits of the procedure and the sedation                            options and risks were discussed with the patient.                            All questions were answered, and informed consent                            was obtained. Prior Anticoagulants: The patient has                            taken no previous anticoagulant or antiplatelet                            agents. ASA Grade Assessment: II - A patient with                            mild systemic disease. After reviewing the risks                            and benefits, the patient was deemed in                            satisfactory condition to undergo the procedure.  After obtaining informed consent, the colonoscope                            was passed under direct vision. Throughout the                            procedure, the patient's blood pressure, pulse, and                            oxygen saturations were monitored continuously. The                            Colonoscope was introduced through the anus and   advanced to the the cecum, identified by                            appendiceal orifice and ileocecal valve. The                            colonoscopy was performed without difficulty. The                            patient tolerated the procedure well. The quality                            of the bowel preparation was good. The ileocecal                            valve, appendiceal orifice, and rectum were                            photographed. The bowel preparation used was                            Miralax via split dose instruction. Scope In: 11:29:57 AM Scope Out: 11:49:32 AM Scope Withdrawal Time: 0 hours 16 minutes 25 seconds  Total Procedure Duration: 0 hours 19 minutes 35 seconds  Findings:                 The perianal and digital rectal examinations were                            normal. Pertinent negatives include normal prostate                            (size, shape, and consistency).                           Eleven (11) sessile polyps were found in the                            sigmoid colon, transverse colon and cecum. The                            polyps were diminutive in size. These  polyps were                            removed with a cold snare. Resection and retrieval                            were complete. Verification of patient                            identification for the specimen was done. Estimated                            blood loss was minimal.                           Many diverticula were found in the sigmoid colon.                           The exam was otherwise without abnormality on                            direct and retroflexion views. Complications:            No immediate complications. Estimated Blood Loss:     Estimated blood loss was minimal. Impression:               - Eleven (11) diminutive polyps in the sigmoid                            colon, in the transverse colon and in the cecum,                            removed  with a cold snare. Resected and retrieved.                           - Diverticulosis in the sigmoid colon.                           - The examination was otherwise normal on direct                            and retroflexion views.                           - Personal history of colonic polyps. Multiple                            adenomas - last exam 2014 only 1, 6 before that in                            2010 and had adenomas before that and mother                            probably had colon cancer Recommendation:           - Patient  has a contact number available for                            emergencies. The signs and symptoms of potential                            delayed complications were discussed with the                            patient. Return to normal activities tomorrow.                            Written discharge instructions were provided to the                            patient.                           - Resume previous diet.                           - Continue present medications.                           - Repeat colonoscopy is recommended for                            surveillance. The colonoscopy date will be                            determined after pathology results from today's                            exam become available for review. Gatha Mayer, MD 07/02/2019 11:56:51 AM This report has been signed electronically.

## 2019-07-02 NOTE — Patient Instructions (Addendum)
I found and removed 11 tiny polyps today. I will let you know pathology results and when to have another routine colonoscopy by mail and/or My Chart.  I appreciate the opportunity to care for you. Gatha Mayer, MD, [facility]  Please read handouts provided. Await pathology results. Continue present medications.      YOU HAD AN ENDOSCOPIC PROCEDURE TODAY AT [facility]:   Refer to the procedure report that was given to you for any specific questions about what was found during the examination.  If the procedure report does not answer your questions, please call your gastroenterologist to clarify.  If you requested that your care partner not be given the details of your procedure findings, then the procedure report has been included in a sealed envelope for you to review at your convenience later.  YOU SHOULD EXPECT: Some feelings of bloating in the abdomen. Passage of more gas than usual.  Walking can help get rid of the air that was put into your GI tract during the procedure and reduce the bloating. If you had a lower endoscopy (such as a colonoscopy or flexible sigmoidoscopy) you may notice spotting of blood in your stool or on the toilet paper. If you underwent a bowel prep for your procedure, you may not have a normal bowel movement for a few days.  Please Note:  You might notice some irritation and congestion in your nose or some drainage.  This is from the oxygen used during your procedure.  There is no need for concern and it should clear up in a day or so.  SYMPTOMS TO REPORT IMMEDIATELY:   Following lower endoscopy (colonoscopy or flexible sigmoidoscopy):  Excessive amounts of blood in the stool  Significant tenderness or worsening of abdominal pains  Swelling of the abdomen that is new, acute  Fever of 100F or higher   For urgent or emergent issues, a gastroenterologist can be reached at any hour by calling [phone].   DIET:  We do recommend a small  meal at first, but then you may proceed to your regular diet.  Drink plenty of fluids but you should avoid alcoholic beverages for 24 hours.  ACTIVITY:  You should plan to take it easy for the rest of today and you should NOT DRIVE or use heavy machinery until tomorrow (because of the sedation medicines used during the test).    FOLLOW UP: Our staff will call the number listed on your records 48-72 hours following your procedure to check on you and address any questions or concerns that you may have regarding the information given to you following your procedure. If we do not reach you, we will leave a message.  We will attempt to reach you two times.  During this call, we will ask if you have developed any symptoms of COVID 19. If you develop any symptoms (ie: fever, flu-like symptoms, shortness of breath, cough etc.) before then, please call [phone].  If you test positive for Covid 19 in the 2 weeks post procedure, please call and report this information to [place].    If any biopsies were taken you will be contacted by phone or by letter within the next 1-3 weeks.  Please call us at [phone] if you have not heard about the biopsies in 3 weeks.    SIGNATURES/CONFIDENTIALITY: You and/or your care partner have signed paperwork which will be entered into your electronic medical record.  These signatures attest to the fact that that  the information above on your After Visit Summary has been reviewed and is understood.  Full responsibility of the confidentiality of this discharge information lies with you and/or your care-partner.

## 2019-07-02 NOTE — Progress Notes (Signed)
Temp-June Vitals-Courtney  Pt's states no medical or surgical changes since previsit or office visit.

## 2019-07-02 NOTE — Progress Notes (Signed)
Report given to PACU, vss 

## 2019-07-04 ENCOUNTER — Ambulatory Visit (INDEPENDENT_AMBULATORY_CARE_PROVIDER_SITE_OTHER): Payer: Medicare Other | Admitting: Acute Care

## 2019-07-04 ENCOUNTER — Ambulatory Visit
Admission: RE | Admit: 2019-07-04 | Discharge: 2019-07-04 | Disposition: A | Discharge status: Home / Self Care | Payer: Medicare Other | Source: Ambulatory Visit | Attending: Acute Care | Admitting: Acute Care

## 2019-07-04 ENCOUNTER — Telehealth: Payer: Self-pay

## 2019-07-04 ENCOUNTER — Encounter: Payer: Self-pay | Admitting: Acute Care

## 2019-07-04 ENCOUNTER — Other Ambulatory Visit: Payer: Self-pay

## 2019-07-04 VITALS — BP 124/74 | HR 66 | Temp 97.2°F | Ht 71.0 in | Wt 168.4 lb

## 2019-07-04 DIAGNOSIS — F17210 Nicotine dependence, cigarettes, uncomplicated: Secondary | ICD-10-CM | POA: Diagnosis not present

## 2019-07-04 DIAGNOSIS — Z122 Encounter for screening for malignant neoplasm of respiratory organs: Secondary | ICD-10-CM

## 2019-07-04 DIAGNOSIS — Z87891 Personal history of nicotine dependence: Secondary | ICD-10-CM

## 2019-07-04 NOTE — Progress Notes (Signed)
Shared Decision Making Visit Lung Cancer Screening Program [phone])   Eligibility:  Age 78 y.o.  Pack Years Smoking History Calculation 43 pack year smoking history (# packs/per year x # years smoked)  Recent History of coughing up blood  no  Unexplained weight loss? no ( >Than 15 pounds within the last 6 months )  Prior History Lung / other cancer no (Diagnosis within the last 5 years already requiring surveillance chest CT Scans).  Smoking Status Current Smoker  Former Smokers: Years since quit:   Quit Date: NA  Visit Components:  Discussion included one or more decision making aids. yes  Discussion included risk/benefits of screening. yes  Discussion included potential follow up diagnostic testing for abnormal scans. yes  Discussion included meaning and risk of over diagnosis. yes  Discussion included meaning and risk of False Positives. yes  Discussion included meaning of total radiation exposure. yes  Counseling Included:  Importance of adherence to annual lung cancer LDCT screening. yes  Impact of comorbidities on ability to participate in the program. yes  Ability and willingness to under diagnostic treatment. yes  Smoking Cessation Counseling:  Current Smokers:   Discussed importance of smoking cessation. yes  Information about tobacco cessation classes and interventions provided to patient. yes  Patient provided with "ticket" for LDCT Scan. yes  Symptomatic Patient. no  Counseling  Diagnosis Code: Tobacco Use Z72.0  Asymptomatic Patient yes  Counseling (Intermediate counseling: > three minutes counseling) [id number]  Former Smokers:   Discussed the importance of maintaining cigarette abstinence. yes  Diagnosis Code: Personal History of Nicotine Dependence. [id number]  Information about tobacco cessation classes and interventions provided to patient. Yes  Patient provided with "ticket" for LDCT Scan. yes  Written Order for Lung Cancer Screening  with LDCT placed in Epic. Yes (CT Chest Lung Cancer Screening Low Dose W/O CM) [id number] Z12.2-Screening of respiratory organs Z87.891-Personal history of nicotine dependence  I have spent 25 minutes of face to face time with Mr. Benthall discussing the risks and benefits of lung cancer screening. We viewed a power point together that explained in detail the above noted topics. We paused at intervals to allow for questions to be asked and answered to ensure understanding.We discussed that the single most powerful action that he can take to decrease his risk of developing lung cancer is to quit smoking. We discussed whether or not he is ready to commit to setting a quit date. We discussed options for tools to aid in quitting smoking including nicotine replacement therapy, non-nicotine medications, support groups, Quit Smart classes, and behavior modification. We discussed that often times setting smaller, more achievable goals, such as eliminating 1 cigarette a day for a week and then 2 cigarettes a day for a week can be helpful in slowly decreasing the number of cigarettes smoked. This allows for a sense of accomplishment as well as providing a clinical benefit. I gave him the " Be Stronger Than Your Excuses" card with contact information for community resources, classes, free nicotine replacement therapy, and access to mobile apps, text messaging, and on-line smoking cessation help. I have also given him my card and contact information in the event he needs to contact me. We discussed the time and location of the scan, and that either Doroteo Glassman RN or I will call with the results within 24-48 hours of receiving them. I have offered him  a copy of the power point we viewed  as a resource in the event they need reinforcement of  the concepts we discussed today in the office. The patient verbalized understanding of all of  the above and had no further questions upon leaving the office. They have my contact  information in the event they have any further questions.  I spent 3 minutes counseling on smoking cessation and the health risks of continued tobacco abuse.  I explained to the patient that there has been a high incidence of coronary artery disease noted on these exams. I explained that this is a non-gated exam therefore degree or severity cannot be determined. This patient is currently on statin therapy. I have asked the patient to follow-up with their PCP regarding any incidental finding of coronary artery disease and management with diet or medication as their PCP  feels is clinically indicated. The patient verbalized understanding of the above and had no further questions upon completion of the visit.   Still smoking socially when he plays golf but goes weeks without a cigarette   Magdalen Spatz, NP 07/04/2019 9:39 AM

## 2019-07-04 NOTE — Telephone Encounter (Signed)
  Follow up Call-  Call back number 07/02/2019  Post procedure Call Back phone  # [phone]  Permission to leave phone message Yes  Some recent data might be hidden     Patient questions:  Do you have a fever, pain , or abdominal swelling? No. Pain Score  0 *  Have you tolerated food without any problems? Yes.    Have you been able to return to your normal activities? Yes.    Do you have any questions about your discharge instructions: Diet   No. Medications  No. Follow up visit  No.  Do you have questions or concerns about your Care? No.  Actions: * If pain score is 4 or above: No action needed, pain <4.  1. Have you developed a fever since your procedure? no  2.   Have you had an respiratory symptoms (SOB or cough) since your procedure? no  3.   Have you tested positive for COVID 19 since your procedure no  4.   Have you had any family members/close contacts diagnosed with the COVID 19 since your procedure?  no   If yes to any of these questions please route to Joylene John, RN and Alphonsa Gin, Therapist, sports.

## 2019-07-06 ENCOUNTER — Other Ambulatory Visit: Payer: Self-pay | Admitting: *Deleted

## 2019-07-06 DIAGNOSIS — Z122 Encounter for screening for malignant neoplasm of respiratory organs: Secondary | ICD-10-CM

## 2019-07-06 DIAGNOSIS — Z87891 Personal history of nicotine dependence: Secondary | ICD-10-CM

## 2019-07-06 DIAGNOSIS — F17210 Nicotine dependence, cigarettes, uncomplicated: Secondary | ICD-10-CM

## 2019-07-08 ENCOUNTER — Encounter: Payer: Self-pay | Admitting: Internal Medicine

## 2019-07-08 NOTE — Progress Notes (Signed)
11 adenomas Recall 2021 Consider genetic testing

## 2019-07-20 ENCOUNTER — Telehealth: Payer: Self-pay | Admitting: Internal Medicine

## 2019-07-20 DIAGNOSIS — Z86010 Personal history of colonic polyps: Secondary | ICD-10-CM

## 2019-07-20 DIAGNOSIS — Z800 Family history of malignant neoplasm of digestive organs: Secondary | ICD-10-CM

## 2019-07-20 NOTE — Telephone Encounter (Signed)
See note below

## 2019-07-20 NOTE — Telephone Encounter (Signed)
Pt received Dr. Celesta Aver message about his results. He would like to speak with him when Dr. Carlean Purl has a chance.

## 2019-07-23 NOTE — Telephone Encounter (Signed)
Pt called again asking to speak with Dr. Carlean Purl.

## 2019-07-24 NOTE — Telephone Encounter (Signed)
Do you want to call him or do you want me to set up an appt?

## 2019-07-24 NOTE — Telephone Encounter (Signed)
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

## 2019-07-25 NOTE — Telephone Encounter (Signed)
Referral entered  

## 2019-07-27 ENCOUNTER — Telehealth: Payer: Self-pay | Admitting: Genetic Counselor

## 2019-07-27 NOTE — Telephone Encounter (Signed)
A genetic counseling appt has been scheduled for [name] to see [name] on 10/21 at [facility]. [name] has been made aware to arrive 15 minutes early.

## 2019-08-07 ENCOUNTER — Telehealth: Payer: Self-pay | Admitting: Genetic Counselor

## 2019-08-07 NOTE — Telephone Encounter (Signed)
Called patient regarding upcoming [facility] appointment, per patient's request this will be a walk-in visit. °

## 2019-08-08 ENCOUNTER — Other Ambulatory Visit: Payer: Self-pay

## 2019-08-08 ENCOUNTER — Other Ambulatory Visit: Payer: Self-pay | Admitting: Genetic Counselor

## 2019-08-08 ENCOUNTER — Inpatient Hospital Stay: Payer: Medicare Other

## 2019-08-08 ENCOUNTER — Inpatient Hospital Stay: Payer: Medicare Other | Attending: Genetic Counselor | Admitting: Genetic Counselor

## 2019-08-08 ENCOUNTER — Encounter: Payer: Self-pay | Admitting: Genetic Counselor

## 2019-08-08 DIAGNOSIS — Z86010 Personal history of colonic polyps: Secondary | ICD-10-CM

## 2019-08-08 DIAGNOSIS — Z800 Family history of malignant neoplasm of digestive organs: Secondary | ICD-10-CM | POA: Diagnosis not present

## 2019-08-08 DIAGNOSIS — Z8042 Family history of malignant neoplasm of prostate: Secondary | ICD-10-CM | POA: Diagnosis not present

## 2019-08-08 NOTE — Progress Notes (Addendum)
REFERRING PROVIDER: Gatha Mayer, MD [address],  [place] [address]  PRIMARY PROVIDER:  Isaac Bliss, Rayford Halsted, MD  PRIMARY REASON FOR VISIT:  1. History of colonic polyps - ? attenuated polyposis   2. Family history of colon cancer   3. Family history of prostate cancer      HISTORY OF PRESENT ILLNESS:   [name], a 78 y.o. male, was seen for a [facility] cancer genetics consultation at the request of Dr. Carlean Purl due to a personal history of polyposis, and family history of cancer.  [name] presents to clinic today to discuss the possibility of a hereditary predisposition to cancer, genetic testing, and to further clarify his future cancer risks, as well as potential cancer risks for family members.   [name] is a 78 y.o. male with no personal history of cancer.  Since 2010, Mr. Mantel has had 19 colon polyps on three different colonoscopies.  He is a former smoker of 50 years.  He quit in 2017.  CANCER HISTORY:  Oncology History   No history exists.     Past Medical History:  Diagnosis Date  . Bronchitis    hx of  . Cataract    BILATERAL-REMOVED  . Chronic kidney disease    stone  . Cigarette smoker   . Colon polyp   . COPD (chronic obstructive pulmonary disease) ([place])    Pt reports does not have history of COPD  . Diverticulosis of colon   . DM (diabetes mellitus) ([place])   . Family history of colon cancer   . Family history of prostate cancer   . Gastritis   . GERD (gastroesophageal reflux disease)   . Hemorrhoids   . Hiatal hernia   . History of BPH   . History of colonic polyps - ? attenuated polyposis 07/27/2007   Adenomas in past, mother w/ probable colon cancer 2004 - none 04/2009 Polyps, multiple (6, max size 8 mm) ADENOMAS 03/2013 - 50m adenoma (1) - 07/02/2019 11 diminutive polyps removed - adenomas CGatha Mayer MD, FMarval Regal    . History of renal calculi   . HTN (hypertension)    pt states med is for kidney stones and not high  bp.   . Hyperlipidemia   . IBS (irritable bowel syndrome)   . Lumbar back pain   . Neck pain   . Shortness of breath     Past Surgical History:  Procedure Laterality Date  . ANTERIOR LAT LUMBAR FUSION  08/14/2012   Procedure: ANTERIOR LATERAL LUMBAR FUSION 1 LEVEL;  Surgeon: HCharlie Pitter MD;  [facility];  Service: Neurosurgery;  Laterality: Left;  Left lumbar three-four extreme lumbar interbody fusion with percutaneous pedicle screws   . BACK SURGERY     x 3  . CBettles . COLONOSCOPY    . COLONOSCOPY W/ BIOPSIES AND POLYPECTOMY  05/06/2009   adenomous polyp, diverticulosis, external hemorrhoids  . CYSTOSCOPY  11/03   TURP and stone removal - Dr EAmalia Hailey . dental implants  2007  . LITHOTRIPSY     x 3  . LUMBAR MICRODISCECTOMY  12/07   L3-4 by Dr PAnnette Stable . LUMBAR PERCUTANEOUS PEDICLE SCREW 1 LEVEL  08/14/2012   Procedure: LUMBAR PERCUTANEOUS PEDICLE SCREW 1 LEVEL;  Surgeon: HCharlie Pitter MD;  [facility];  Service: Neurosurgery;  Laterality: Left;  Left lumbar three-four extreme lumbar interbody fusion with percutaneous pedicle screws   .  ROTATOR CUFF REPAIR     left shoulder  . UPPER GASTROINTESTINAL ENDOSCOPY  09/07/2010   erosive gastritis, GERD  . UVULOPALATOPHARYNGOPLASTY  2001   Dr Wilburn Cornelia    Social History   Socioeconomic History  . Marital status: Married    Spouse name: Not on file  . Number of children: 2  . Years of education: Not on file  . Highest education level: Not on file  Occupational History  . Occupation: retired from [employer]  . Financial resource strain: Not on file  . Food insecurity    Worry: Not on file    Inability: Not on file  . Transportation needs    Medical: Not on file    Non-medical: Not on file  Tobacco Use  . Smoking status: Current Some Day Smoker    Packs/day: 0.80    Years: 51.00    Pack years: 40.80    Types: Cigarettes  . Smokeless tobacco: Never Used  .  Tobacco comment: 1-3 cigarettes daily when golfing----Counseling given in exam room to quit smoking   Substance and Sexual Activity  . Alcohol use: Yes    Alcohol/week: 3.0 standard drinks    Types: 3 Standard drinks or equivalent per week    Comment: social  . Drug use: No  . Sexual activity: Not on file  Lifestyle  . Physical activity    Days per week: Not on file    Minutes per session: Not on file  . Stress: Not on file  Relationships  . Social Herbalist on phone: Not on file    Gets together: Not on file    Attends religious service: Not on file    Active member of club or organization: Not on file    Attends meetings of clubs or organizations: Not on file    Relationship status: Not on file  Other Topics Concern  . Not on file  Social History Narrative  . Not on file     FAMILY HISTORY:  We obtained a detailed, 4-generation family history.  Significant diagnoses are listed below: Family History  Problem Relation Age of Onset  . Diabetes Father   . Kidney failure Father   . Colon cancer Mother        ? she had met dis and chemoRx  . Prostate cancer Brother   . Esophageal cancer Neg Hx   . Rectal cancer Neg Hx   . Stomach cancer Neg Hx     The patient has two children who are cancer free.  He has two sisters who are cancer free, and a brother who was diagnosed with prostate cancer in his 66s; none have multiple colon polyps.  Both parents are deceased.  The patient's mother was diagnosed with colon cancer at 31 and died at 68.  She had two sisters who died at ages 52 and 71 and were cancer free.  The maternal grandparents are deceased.  The grandmother died soon after childbirth from a tonsillectomy.  The patient's father died from complications of diabetes.  He had three brothers and three sisters.  None reportedly had cancer.  The paternal grandparents are deceased from non-cancer related issues.  Mr. Chittick is unaware of previous family history of  genetic testing for hereditary cancer risks. Patient's maternal ancestors are of [place] descent, and paternal ancestors are of [place] descent. There is no reported Ashkenazi Jewish ancestry. There is no known consanguinity.  GENETIC COUNSELING ASSESSMENT: Mr. Bur  is a 78 y.o. male with a personal history of polyposis and family history of cancer which is somewhat suggestive of a hereditary cancer syndrome and predisposition to cancer given the number of polyps and his family history of colon cancer. We, therefore, discussed and recommended the following at today's visit.   DISCUSSION: We disucssed that many people will have colon polyps, but most will have fewer than 5 in their lifetime.  When an individual develops over 10 polyps it raises a concern for a hereditary polyposis condition that increases the risk for colon cancer.  We discussed that 5 - 7% of colon cancer is hereditary, with most cases associated with Lynch syndrome.  There are other genes that can be associated with hereditary polyposis cancer syndromes.  These include APC and MUTYH, most commonly.  We discussed that testing is beneficial for several reasons including knowing how to follow individuals after completing their treatment, and understand if other family members could be at risk for cancer and allow them to undergo genetic testing.   We reviewed the characteristics, features and inheritance patterns of hereditary cancer syndromes. We also discussed genetic testing, including the appropriate family members to test, the process of testing, insurance coverage and turn-around-time for results. We discussed the implications of a negative, positive, carrier and/or variant of uncertain significant result. We recommended Mr. Evola pursue genetic testing for the common hereditary cancer gene panel. The Common Hereditary Gene Panel offered by [facility] includes sequencing and/or deletion duplication testing of the following 48 genes: APC,  ATM, AXIN2, BARD1, BMPR1A, BRCA1, BRCA2, BRIP1, CDH1, CDK4, CDKN2A (p14ARF), CDKN2A (p16INK4a), CHEK2, CTNNA1, DICER1, EPCAM (Deletion/duplication testing only), GREM1 (promoter region deletion/duplication testing only), KIT, MEN1, MLH1, MSH2, MSH3, MSH6, MUTYH, NBN, NF1, NHTL1, PALB2, PDGFRA, PMS2, POLD1, POLE, PTEN, RAD50, RAD51C, RAD51D, RNF43, SDHB, SDHC, SDHD, SMAD4, SMARCA4. STK11, TP53, TSC1, TSC2, and VHL.  The following genes were evaluated for sequence changes only: SDHA and HOXB13 c.251G>A variant only.   Based on Mr. Narciso's personal history of polyposis and family history of cancer, he meets medical criteria for genetic testing. Despite that he meets criteria, he may still have an out of pocket cost. We discussed that if his out of pocket cost for testing is over $100, the laboratory will call and confirm whether he wants to proceed with testing.  If the out of pocket cost of testing is less than $100 he will be billed by the genetic testing laboratory.   PLAN: After considering the risks, benefits, and limitations, Mr. Dugdale provided informed consent to pursue genetic testing and the blood sample was sent to [facility] for analysis of the common hereditary cancer panel. Results should be available within approximately 2-3 weeks' time, at which point they will be disclosed by telephone to Mr. Alberico, as will any additional recommendations warranted by these results. Mr. Volkert will receive a summary of his genetic counseling visit and a copy of his results once available. This information will also be available in Epic.   Lastly, we encouraged Mr. Benard to remain in contact with cancer genetics annually so that we can continuously update the family history and inform him of any changes in cancer genetics and testing that may be of benefit for this family.   Mr. Schuman's questions were answered to his satisfaction today. Our contact information was provided should additional  questions or concerns arise. Thank you for the referral and allowing [place] to share in the care of your patient.   Srihaan Mastrangelo P.  [place], [place], [facility] Licensed, Insurance risk surveyor [email or web address] phone: [phone]  The patient was seen for a total of 35 minutes in face-to-face genetic counseling.  This patient was discussed with Drs. Magrinat, Lindi Adie and/or Burr Medico who agrees with the above.    _______________________________________________________________________ For Office Staff:  Number of people involved in session: 1 Was an Intern/ student involved with case: no

## 2019-08-16 ENCOUNTER — Ambulatory Visit: Payer: Self-pay | Admitting: Genetic Counselor

## 2019-08-16 ENCOUNTER — Telehealth: Payer: Self-pay | Admitting: Genetic Counselor

## 2019-08-16 ENCOUNTER — Encounter: Payer: Self-pay | Admitting: Genetic Counselor

## 2019-08-16 DIAGNOSIS — Z1379 Encounter for other screening for genetic and chromosomal anomalies: Secondary | ICD-10-CM

## 2019-08-16 NOTE — Telephone Encounter (Signed)
Revealed negative genetic testing.  Discussed that we do not know why he has polyposis or why there is cancer in the family. It could be due to a different gene that we are not testing, or maybe our current technology may not be able to pick something up.  It will be important for him to keep in contact with genetics to keep up with whether additional testing may be needed.  ? ? ?

## 2019-08-16 NOTE — Telephone Encounter (Signed)
LM on VM that results are back and to please call.  Left CB instructions. 

## 2019-08-16 NOTE — Progress Notes (Addendum)
HPI:  Mr. Orgeron was previously seen in the [facility] clinic due to a personal history of polyposis and family history of cancer and concerns regarding a hereditary predisposition to cancer. Please refer to our prior cancer genetics clinic note for more information regarding our discussion, assessment and recommendations, at the time. Mr. Leasure's recent genetic test results were disclosed to him, as were recommendations warranted by these results. These results and recommendations are discussed in more detail below.  CANCER HISTORY:  Oncology History   No history exists.    FAMILY HISTORY:  We obtained a detailed, 4-generation family history.  Significant diagnoses are listed below: Family History  Problem Relation Age of Onset  . Diabetes Father   . Kidney failure Father   . Colon cancer Mother        ? she had met dis and chemoRx  . Prostate cancer Brother   . Esophageal cancer Neg Hx   . Rectal cancer Neg Hx   . Stomach cancer Neg Hx     The patient has two children who are cancer free.  He has two sisters who are cancer free, and a brother who was diagnosed with prostate cancer in his 66's; none have multiple colon polyps.  Both parents are deceased.  The patient's mother was diagnosed with colon cancer at 80 and died at 55.  She had two sisters who died at ages 31 and 29 and were cancer free.  The maternal grandparents are deceased.  The grandmother died soon after childbirth from a tonsillectomy.  The patient's father died from complications of diabetes.  He had three brothers and three sisters.  None reportedly had cancer.  The paternal grandparents are deceased from non-cancer related issues.  Mr. Pomplun is unaware of previous family history of genetic testing for hereditary cancer risks. Patient's maternal ancestors are of [place] descent, and paternal ancestors are of [place] descent. There is no reported Ashkenazi Jewish ancestry. There is no known  consanguinity.    GENETIC TEST RESULTS: Genetic testing reported out on August 15, 2019 through the common hereditary cancer panel found no pathogenic mutations. The Common Hereditary Gene Panel offered by [facility] includes sequencing and/or deletion duplication testing of the following 48 genes: APC, ATM, AXIN2, BARD1, BMPR1A, BRCA1, BRCA2, BRIP1, CDH1, CDK4, CDKN2A (p14ARF), CDKN2A (p16INK4a), CHEK2, CTNNA1, DICER1, EPCAM (Deletion/duplication testing only), GREM1 (promoter region deletion/duplication testing only), KIT, MEN1, MLH1, MSH2, MSH3, MSH6, MUTYH, NBN, NF1, NHTL1, PALB2, PDGFRA, PMS2, POLD1, POLE, PTEN, RAD50, RAD51C, RAD51D, RNF43, SDHB, SDHC, SDHD, SMAD4, SMARCA4. STK11, TP53, TSC1, TSC2, and VHL.  The following genes were evaluated for sequence changes only: SDHA and HOXB13 c.251G>A variant only. The test report has been scanned into EPIC and is located under the Molecular Pathology section of the Results Review tab.  A portion of the result report is included below for reference.     We discussed with Mr. Velazco that because current genetic testing is not perfect, it is possible there may be a gene mutation in one of these genes that current testing cannot detect, but that chance is small.  We also discussed, that there could be another gene that has not yet been discovered, or that we have not yet tested, that is responsible for the cancer diagnoses in the family. It is also possible there is a hereditary cause for the cancer in the family that Mr. Kivett did not inherit and therefore was not identified in his testing.  Therefore, it is important  to remain in touch with cancer genetics in the future so that we can continue to offer Mr. Sanzo the most up to date genetic testing.   RECOMMENDATIONS FOR FAMILY MEMBERS:  Individuals in this family might be at some increased risk of developing cancer, over the general population risk, simply due to the family history of cancer.  We  recommended women in this family have a yearly mammogram beginning at age 15, or 35 years younger than the earliest onset of cancer, an annual clinical breast exam, and perform monthly breast self-exams. Women in this family should also have a gynecological exam as recommended by their primary provider. All family members should have a colonoscopy by age 45.  FOLLOW-UP: Lastly, we discussed with Mr. Trompeter that cancer genetics is a rapidly advancing field and it is possible that new genetic tests will be appropriate for him and/or his family members in the future. We encouraged him to remain in contact with cancer genetics on an annual basis so we can update his personal and family histories and let him know of advances in cancer genetics that may benefit this family.   Our contact number was provided. Mr. Simonin's questions were answered to his satisfaction, and he knows he is welcome to call us at anytime with additional questions or concerns.   Roma Kayser, [place], [facility] Licensed, Certified Genetic Counselor [email or web address]

## 2019-09-12 ENCOUNTER — Other Ambulatory Visit: Payer: Self-pay

## 2019-10-19 HISTORY — PX: COLONOSCOPY: SHX174

## 2019-10-31 ENCOUNTER — Encounter: Payer: Self-pay | Admitting: Internal Medicine

## 2019-11-04 ENCOUNTER — Ambulatory Visit: Payer: Medicare Other | Attending: Internal Medicine

## 2019-11-04 DIAGNOSIS — Z23 Encounter for immunization: Secondary | ICD-10-CM | POA: Diagnosis not present

## 2019-11-06 ENCOUNTER — Telehealth: Payer: Medicare Other | Admitting: Nurse Practitioner

## 2019-11-06 DIAGNOSIS — N3000 Acute cystitis without hematuria: Secondary | ICD-10-CM

## 2019-11-06 MED ORDER — SULFAMETHOXAZOLE-TRIMETHOPRIM 800-160 MG PO TABS: 1.0000 | ORAL_TABLET | Freq: Two times a day (BID) | ORAL | 0 refills | Status: DC

## 2019-11-06 NOTE — Progress Notes (Signed)
We are sorry that you are not feeling well.  Here is how we plan to help!  Based on what you shared with me it looks like you most likely have a simple urinary tract infection.  A UTI (Urinary Tract Infection) is a bacterial infection of the bladder.  Most cases of urinary tract infections are simple to treat but a key part of your care is to encourage you to drink plenty of fluids and watch your symptoms carefully.  I have prescribed Bactrim DS One tablet twice a day for 10 days.  Your symptoms should gradually improve. Call us if the burning in your urine worsens, you develop worsening fever, back pain or pelvic pain or if your symptoms do not resolve after completing the antibiotic.  Urinary tract infections can be prevented by drinking plenty of water to keep your body hydrated.  Also be sure when you wipe, wipe from front to back and don't hold it in!  If possible, empty your bladder every 4 hours.  Your e-visit answers were reviewed by a board certified advanced clinical practitioner to complete your personal care plan.  Depending on the condition, your plan could have included both over the counter or prescription medications.  If there is a problem please reply  once you have received a response from your provider.  Your safety is important to [place].  If you have drug allergies check your prescription carefully.    You can use MyChart to ask questions about today's visit, request a non-urgent call back, or ask for a work or school excuse for 24 hours related to this e-Visit. If it has been greater than 24 hours you will need to follow up with your provider, or enter a new e-Visit to address those concerns.   You will get an e-mail in the next two days asking about your experience.  I hope that your e-visit has been valuable and will speed your recovery. Thank you for using e-visits.   5-10 minutes spent reviewing and documenting in chart.

## 2019-11-08 DIAGNOSIS — N281 Cyst of kidney, acquired: Secondary | ICD-10-CM | POA: Diagnosis not present

## 2019-11-08 DIAGNOSIS — Z87442 Personal history of urinary calculi: Secondary | ICD-10-CM | POA: Diagnosis not present

## 2019-11-08 DIAGNOSIS — R300 Dysuria: Secondary | ICD-10-CM | POA: Diagnosis not present

## 2019-11-15 DIAGNOSIS — N200 Calculus of kidney: Secondary | ICD-10-CM | POA: Diagnosis not present

## 2019-11-15 DIAGNOSIS — N3000 Acute cystitis without hematuria: Secondary | ICD-10-CM | POA: Diagnosis not present

## 2019-11-23 ENCOUNTER — Ambulatory Visit: Payer: Medicare Other | Attending: Internal Medicine

## 2019-11-23 DIAGNOSIS — Z23 Encounter for immunization: Secondary | ICD-10-CM | POA: Insufficient documentation

## 2019-11-23 NOTE — Progress Notes (Signed)
   Covid-19 Vaccination Clinic  Name:  [name]    MRN: [id number] DOB: [birth year]  11/23/2019  [name] was observed post Covid-19 immunization for 15 minutes without incidence. He was provided with Vaccine Information Sheet and instruction to access the V-Safe system.   [name] was instructed to call 911 with any severe reactions post vaccine: Marland Kitchen Difficulty breathing  . Swelling of your face and throat  . A fast heartbeat  . A bad rash all over your body  . Dizziness and weakness    Immunizations Administered    Name Date Dose VIS Date Route   Pfizer COVID-19 Vaccine 11/23/2019  8:32 AM 0.3 mL 09/28/2019 Intramuscular   Manufacturer: [place]   Lot: [id number]   [place]: [id number]

## 2019-12-14 ENCOUNTER — Ambulatory Visit: Payer: Medicare Other | Admitting: Internal Medicine

## 2019-12-26 ENCOUNTER — Other Ambulatory Visit: Payer: Self-pay | Admitting: Internal Medicine

## 2019-12-26 DIAGNOSIS — A6000 Herpesviral infection of urogenital system, unspecified: Secondary | ICD-10-CM

## 2019-12-26 DIAGNOSIS — E119 Type 2 diabetes mellitus without complications: Secondary | ICD-10-CM

## 2020-02-07 ENCOUNTER — Other Ambulatory Visit: Payer: Self-pay

## 2020-02-08 ENCOUNTER — Ambulatory Visit (INDEPENDENT_AMBULATORY_CARE_PROVIDER_SITE_OTHER): Payer: Medicare Other | Admitting: Cardiology

## 2020-02-08 ENCOUNTER — Ambulatory Visit (INDEPENDENT_AMBULATORY_CARE_PROVIDER_SITE_OTHER): Payer: Medicare Other | Admitting: Internal Medicine

## 2020-02-08 ENCOUNTER — Encounter: Payer: Self-pay | Admitting: Cardiology

## 2020-02-08 ENCOUNTER — Encounter: Payer: Self-pay | Admitting: Internal Medicine

## 2020-02-08 VITALS — BP 126/58 | HR 67 | Temp 97.1°F | Ht 71.0 in | Wt 171.0 lb

## 2020-02-08 VITALS — BP 140/90 | HR 70 | Temp 98.0°F | Wt 171.7 lb

## 2020-02-08 DIAGNOSIS — F17200 Nicotine dependence, unspecified, uncomplicated: Secondary | ICD-10-CM | POA: Diagnosis not present

## 2020-02-08 DIAGNOSIS — Z87891 Personal history of nicotine dependence: Secondary | ICD-10-CM

## 2020-02-08 DIAGNOSIS — I10 Essential (primary) hypertension: Secondary | ICD-10-CM

## 2020-02-08 DIAGNOSIS — Z136 Encounter for screening for cardiovascular disorders: Secondary | ICD-10-CM | POA: Diagnosis not present

## 2020-02-08 DIAGNOSIS — R0602 Shortness of breath: Secondary | ICD-10-CM | POA: Diagnosis not present

## 2020-02-08 DIAGNOSIS — E785 Hyperlipidemia, unspecified: Secondary | ICD-10-CM | POA: Diagnosis not present

## 2020-02-08 DIAGNOSIS — R0600 Dyspnea, unspecified: Secondary | ICD-10-CM | POA: Diagnosis not present

## 2020-02-08 DIAGNOSIS — E119 Type 2 diabetes mellitus without complications: Secondary | ICD-10-CM | POA: Diagnosis not present

## 2020-02-08 DIAGNOSIS — R0609 Other forms of dyspnea: Secondary | ICD-10-CM

## 2020-02-08 LAB — POCT GLYCOSYLATED HEMOGLOBIN (HGB A1C): Hemoglobin A1C: 6.1 % — AB (ref 4.0–5.6)

## 2020-02-08 NOTE — Progress Notes (Signed)
Cardiology Office Note   Date:  02/08/2020   ID:  [name], DOB November 04, 1940, MRN [id number]  PCP:  Isaac Bliss, Rayford Halsted, MD  Cardiologist:   No primary care provider on file. Referring:  Isaac Bliss, Rayford Halsted, MD  Chief Complaint  Patient presents with  . Shortness of Breath      History of Present Illness: [name] is a 79 y.o. male who is referred by Isaac Bliss, Rayford Halsted, MD for evaluation of SOB.  He has had a little bit of dyspnea walking 75 to 90 yards with garbage cans going to his curb.  He has had a little left arm tingling with this as well occasionally.  He gets a little short of breath walking up the stairs.  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  He is not describing substernal chest discomfort, neck or arm discomfort.  He is not having any new resting shortness of breath, PND or orthopnea.  He said no palpitations, presyncope or syncope.  He does have a little aortic atherosclerosis on a CT that he had is a smoker screening.  He is not had any coronary calcium.    Past Medical History:  Diagnosis Date  . Bronchitis    hx of  . Cataract    BILATERAL-REMOVED  . Chronic kidney disease    stone  . Cigarette smoker   . Colon polyp   . COPD (chronic obstructive pulmonary disease) ([place])    Pt reports does not have history of COPD  . Diverticulosis of colon   . DM (diabetes mellitus) ([place])   . Gastritis   . GERD (gastroesophageal reflux disease)   . Hemorrhoids   . Hiatal hernia   . History of BPH   . History of colonic polyps - ? attenuated polyposis 07/27/2007   Adenomas in past, mother w/ probable colon cancer 2004 - none 04/2009 Polyps, multiple (6, max size 8 mm) ADENOMAS 03/2013 - 80mm adenoma (1) - 07/02/2019 11 diminutive polyps removed - adenomas Gatha Mayer, MD, [facility] Regal     . History of renal calculi   . Hyperlipidemia   . IBS (irritable bowel syndrome)   .  Lumbar back pain   . Neck pain     Past Surgical History:  Procedure Laterality Date  . ANTERIOR LAT LUMBAR FUSION  08/14/2012   Procedure: ANTERIOR LATERAL LUMBAR FUSION 1 LEVEL;  Surgeon: Charlie Pitter, MD;  Location: [facility];  Service: Neurosurgery;  Laterality: Left;  Left lumbar three-four extreme lumbar interbody fusion with percutaneous pedicle screws   . BACK SURGERY     x 3  . [place]  . COLONOSCOPY    . COLONOSCOPY W/ BIOPSIES AND POLYPECTOMY  05/06/2009   adenomous polyp, diverticulosis, external hemorrhoids  . CYSTOSCOPY  11/03   TURP and stone removal - Dr Amalia Hailey  . dental implants  2007  . LITHOTRIPSY     x 3  . LUMBAR MICRODISCECTOMY  12/07   L3-4 by Dr Annette Stable  . LUMBAR PERCUTANEOUS PEDICLE SCREW 1 LEVEL  08/14/2012   Procedure: LUMBAR PERCUTANEOUS PEDICLE SCREW 1 LEVEL;  Surgeon: Charlie Pitter, MD;  Location: [facility];  Service: Neurosurgery;  Laterality: Left;  Left lumbar three-four extreme lumbar interbody fusion with percutaneous pedicle screws   . ROTATOR CUFF REPAIR     left shoulder  . UPPER  GASTROINTESTINAL ENDOSCOPY  09/07/2010   erosive gastritis, GERD  . UVULOPALATOPHARYNGOPLASTY  2001   Dr Wilburn Cornelia     Current Outpatient Medications  Medication Sig Dispense Refill  . acyclovir (ZOVIRAX) 400 MG tablet TAKE 1 TABLET(400 MG) BY MOUTH TWICE DAILY (Patient taking differently: Take 200 mg by mouth 2 (two) times daily. ) 180 tablet 1  . aspirin 81 MG tablet Take 81 mg by mouth daily.      . metFORMIN (GLUCOPHAGE) 500 MG tablet TAKE 1 TABLET BY MOUTH DAILY WITH A MEAL 90 tablet 1  . simvastatin (ZOCOR) 20 MG tablet Take 1 tablet (20 mg total) by mouth at bedtime. 90 tablet 1   No current facility-administered medications for this visit.    Allergies:   Patient has no known allergies.    Social History:  The patient  reports that he has been smoking cigarettes. He has a 40.80 pack-year smoking history. He has never used  smokeless tobacco. He reports current alcohol use of about 3.0 standard drinks of alcohol per week. He reports that he does not use drugs.   Family History:  The patient's family history includes Colon cancer in his mother; Diabetes in his father; Kidney failure in his father; Prostate cancer in his brother.    ROS:  Please see the history of present illness.   Otherwise, review of systems are positive for none.   All other systems are reviewed and negative.    PHYSICAL EXAM: VS:  BP (!) 126/58 (BP Location: Right Arm, Patient Position: Sitting, Cuff Size: Normal)   Pulse 67   Temp (!) 97.1 F (36.2 C)   Ht 5\' 11"  (1.803 m)   Wt 171 lb (77.6 kg)   BMI 23.85 kg/m  , BMI Body mass index is 23.85 kg/m. GENERAL:  Well appearing HEENT:  Pupils equal round and reactive, fundi not visualized, oral mucosa unremarkable NECK:  No jugular venous distention, waveform within normal limits, carotid upstroke brisk and symmetric, no bruits, no thyromegaly LYMPHATICS:  No cervical, inguinal adenopathy LUNGS:  Clear to auscultation bilaterally BACK:  No CVA tenderness CHEST:  Unremarkable HEART:  PMI not displaced or sustained,S1 and S2 within normal limits, no S3, no S4, no clicks, no rubs, no murmurs ABD:  Flat, positive bowel sounds normal in frequency in pitch, no bruits, no rebound, no guarding, no midline pulsatile mass, no hepatomegaly, no splenomegaly EXT:  2 plus pulses throughout, no edema, no cyanosis no clubbing SKIN:  No rashes no nodules NEURO:  Cranial nerves II through XII grossly intact, motor grossly intact throughout PSYCH:  Cognitively intact, oriented to person place and time    EKG:  EKG is ordered today. The ekg ordered today demonstrates sinus rhythm, rate 70, axis within normal limits, intervals within normal limits, poor anterior R wave progression, no acute ST-T wave changes.   Recent Labs: 06/15/2019: ALT 14; BUN 21; Creatinine, Ser 1.11; Hemoglobin 14.3; Platelets  216.0; Potassium 4.5; Sodium 140; TSH 3.99    Lipid Panel    Component Value Date/Time   CHOL 121 06/15/2019 0814   TRIG 75.0 06/15/2019 0814   HDL 46.20 06/15/2019 0814   CHOLHDL 3 06/15/2019 0814   VLDL 15.0 06/15/2019 0814   LDLCALC 60 06/15/2019 0814      Wt Readings from Last 3 Encounters:  02/08/20 171 lb (77.6 kg)  02/08/20 171 lb 11.2 oz (77.9 kg)  07/04/19 168 lb 6.4 oz (76.4 kg)      Other studies Reviewed: Additional  studies/ records that were reviewed today include: Labs. Review of the above records demonstrates:  Please see elsewhere in the note.     ASSESSMENT AND PLAN:  SOB:   This could be an anginal equivalent.  He does have risk factors.  I will bring him back for a POET (Plain Old Exercise Treadmill).  If this is negative then no further cardiac work-up would be suggested.  He will continue with primary risk reduction.  DM: A1c was 6.6.  It is now 6.1.  No change in therapy.  AAA SCREEN:  I will send for screening with his smoking history.    COVID EDUCATION:  He has had his vaccine.    DYSLIPIDEMIA: His LDL was 60.  HDL 46.2.  No change in therapy.  Current medicines are reviewed at length with the patient today.  The patient does not have concerns regarding medicines.  The following changes have been made:  no change  Labs/ tests ordered today include:  Orders Placed This Encounter  Procedures  . EXERCISE TOLERANCE TEST (ETT)  . VAS [place] AAA DUPLEX     Disposition:   FU with me as needed.     Signed, Minus Breeding, MD  02/08/2020 5:32 PM    [place]

## 2020-02-08 NOTE — Patient Instructions (Signed)
Medication Instructions:  NO CHANGES *If you need a refill on your cardiac medications before your next appointment, please call your pharmacy*  Lab Work: NONE ORDERED THIS VISIT  Testing/Procedures: Your physician has requested that you have an abdominal aorta duplex. During this test, an ultrasound is used to evaluate the aorta. Allow 30 minutes for this exam. Do not eat after midnight the day before and avoid carbonated beverages  Your physician has requested that you have an exercise tolerance test. For further information please visit [email or web address]. Please also follow instruction sheet, as given.   You will need a COVID screening 3 days before your stress test.This is a Drive Up Visit at the [facility] [address] [place]. Someone will direct you to the appropriate testing line. Stay in your car and someone will be with you shortly  Follow-Up: At [facility], you and your health needs are our priority.  As part of our continuing mission to provide you with exceptional heart care, we have created designated Provider Care Teams.  These Care Teams include your primary Cardiologist (physician) and Advanced Practice Providers (APPs -  Physician Assistants and Nurse Practitioners) who all work together to provide you with the care you need, when you need it.  Your next appointment:   FOLLOW UP AS NEEDED

## 2020-02-08 NOTE — Progress Notes (Signed)
Established Patient Office Visit     This visit occurred during the SARS-CoV-2 public health emergency.  Safety protocols were in place, including screening questions prior to the visit, additional usage of staff PPE, and extensive cleaning of exam room while observing appropriate contact time as indicated for disinfecting solutions.    CC/Reason for Visit: Follow-up chronic conditions and discuss acute concerns  HPI: [name] is a 79 y.o. male who is coming in today for the above mentioned reasons. Past Medical History is significant for: Hypertension, hyperlipidemia and type 2 diabetes that has been well controlled.    He is also a smoker.  He was scheduled as a diabetic follow-up today.  He had his 2 Covid vaccines.  He has noticed that his blood pressure has been a little higher than average, it is 140/90 in office today, he has never been noted to have elevated blood pressures in the office before.  In addition he tells me for the past 6 weeks he has had significant dyspnea on exertion while climbing up a flight of steps or pulling his trash can out to the curb which is about 100 yards.  This is new for him.  He is usually very physically active, plays golf routinely, he has a home gym and does stretches and weight training without chest pain or dyspnea.  He denies chest pain, palpitations, dizziness, blurred or double vision.   Past Medical/Surgical History: Past Medical History:  Diagnosis Date  . Bronchitis    hx of  . Cataract    BILATERAL-REMOVED  . Chronic kidney disease    stone  . Cigarette smoker   . Colon polyp   . COPD (chronic obstructive pulmonary disease) ([place])    Pt reports does not have history of COPD  . Diverticulosis of colon   . DM (diabetes mellitus) ([place])   . Family history of colon cancer   . Family history of prostate cancer   . Gastritis   . GERD (gastroesophageal reflux disease)   . Hemorrhoids   . Hiatal hernia   . History of BPH   .  History of colonic polyps - ? attenuated polyposis 07/27/2007   Adenomas in past, mother w/ probable colon cancer 2004 - none 04/2009 Polyps, multiple (6, max size 8 mm) ADENOMAS 03/2013 - 21m adenoma (1) - 07/02/2019 11 diminutive polyps removed - adenomas CGatha Mayer MD, FMarval Regal    . History of renal calculi   . HTN (hypertension)    pt states med is for kidney stones and not high bp.   . Hyperlipidemia   . IBS (irritable bowel syndrome)   . Lumbar back pain   . Neck pain   . Shortness of breath     Past Surgical History:  Procedure Laterality Date  . ANTERIOR LAT LUMBAR FUSION  08/14/2012   Procedure: ANTERIOR LATERAL LUMBAR FUSION 1 LEVEL;  Surgeon: HCharlie Pitter MD;  [facility];  Service: Neurosurgery;  Laterality: Left;  Left lumbar three-four extreme lumbar interbody fusion with percutaneous pedicle screws   . BACK SURGERY     x 3  . [place] . COLONOSCOPY    . COLONOSCOPY W/ BIOPSIES AND POLYPECTOMY  05/06/2009   adenomous polyp, diverticulosis, external hemorrhoids  . CYSTOSCOPY  11/03   TURP and stone removal - Dr EAmalia Hailey . dental implants  2007  . LITHOTRIPSY     x 3  .  LUMBAR MICRODISCECTOMY  12/07   L3-4 by Dr Annette Stable  . LUMBAR PERCUTANEOUS PEDICLE SCREW 1 LEVEL  08/14/2012   Procedure: LUMBAR PERCUTANEOUS PEDICLE SCREW 1 LEVEL;  Surgeon: Charlie Pitter, MD;  Location: [facility];  Service: Neurosurgery;  Laterality: Left;  Left lumbar three-four extreme lumbar interbody fusion with percutaneous pedicle screws   . ROTATOR CUFF REPAIR     left shoulder  . UPPER GASTROINTESTINAL ENDOSCOPY  09/07/2010   erosive gastritis, GERD  . UVULOPALATOPHARYNGOPLASTY  2001   Dr Wilburn Cornelia    Social History:  reports that he has been smoking cigarettes. He has a 40.80 pack-year smoking history. He has never used smokeless tobacco. He reports current alcohol use of about 3.0 standard drinks of alcohol per week. He reports that he does not use  drugs.  Allergies: No Known Allergies  Family History:  Family History  Problem Relation Age of Onset  . Diabetes Father   . Kidney failure Father   . Colon cancer Mother        ? she had met dis and chemoRx  . Prostate cancer Brother   . Esophageal cancer Neg Hx   . Rectal cancer Neg Hx   . Stomach cancer Neg Hx      Current Outpatient Medications:  .  acyclovir (ZOVIRAX) 400 MG tablet, TAKE 1 TABLET(400 MG) BY MOUTH TWICE DAILY, Disp: 180 tablet, Rfl: 1 .  aspirin 81 MG tablet, Take 81 mg by mouth daily.  , Disp: , Rfl:  .  metFORMIN (GLUCOPHAGE) 500 MG tablet, TAKE 1 TABLET BY MOUTH DAILY WITH A MEAL, Disp: 90 tablet, Rfl: 1 .  simvastatin (ZOCOR) 20 MG tablet, Take 1 tablet (20 mg total) by mouth at bedtime., Disp: 90 tablet, Rfl: 1  Review of Systems:  Constitutional: Denies fever, chills, diaphoresis, appetite change and fatigue.  HEENT: Denies photophobia, eye pain, redness, hearing loss, ear pain, congestion, sore throat, rhinorrhea, sneezing, mouth sores, trouble swallowing, neck pain, neck stiffness and tinnitus.   Respiratory: Denies cough, chest tightness,  and wheezing.   Cardiovascular: Denies chest pain, palpitations and leg swelling.  Gastrointestinal: Denies nausea, vomiting, abdominal pain, diarrhea, constipation, blood in stool and abdominal distention.  Genitourinary: Denies dysuria, urgency, frequency, hematuria, flank pain and difficulty urinating.  Endocrine: Denies: hot or cold intolerance, sweats, changes in hair or nails, polyuria, polydipsia. Musculoskeletal: Denies myalgias, back pain, joint swelling, arthralgias and gait problem.  Skin: Denies pallor, rash and wound.  Neurological: Denies dizziness, seizures, syncope, weakness, light-headedness, numbness and headaches.  Hematological: Denies adenopathy. Easy bruising, personal or family bleeding history  Psychiatric/Behavioral: Denies suicidal ideation, mood changes, confusion, nervousness, sleep  disturbance and agitation    Physical Exam: Vitals:   02/08/20 0716  BP: 140/90  Pulse: 70  Temp: 98 F (36.7 C)  TempSrc: Temporal  SpO2: 98%  Weight: 171 lb 11.2 oz (77.9 kg)    Body mass index is 23.95 kg/m.   Constitutional: NAD, calm, comfortable Eyes: PERRL, lids and conjunctivae normal, wears corrective lenses ENMT: Mucous membranes are moist.  Respiratory: clear to auscultation bilaterally, no wheezing, no crackles. Normal respiratory effort. No accessory muscle use.  Cardiovascular: Regular rate and rhythm, no murmurs / rubs / gallops. No extremity edema.  Neurologic: Grossly intact and nonfocal Psychiatric: Normal judgment and insight. Alert and oriented x 3. Normal mood.    Impression and Plan:  DOE (dyspnea on exertion) -Concern for an anginal equivalent in this gentleman with multiple coronary artery disease risk factors  including age, gender, history of hypertension, history of hyperlipidemia, history of diabetes, tobacco use. -EKG done in office today and interpreted by myself: Normal sinus rhythm at a rate of 68, normal axis, no acute ischemic changes. -Will place an urgent cardiology referral, I believe he needs to be studied further. -Can consider pulmonary referral if cardiac work-up is negative as he is a chronic smoker and there is a concern for mild COPD.  His lung exam is clear today.  Diabetes mellitus without complication ([place])  -Well-controlled with an A1c of 6.1 in office today.  Essential hypertension -Uncontrolled, he has never had elevated blood pressures in the office before. -He will do ambulatory blood pressure monitoring and follow-up if elevated.  Dyslipidemia -Last LDL was in August 2020 of 68 at goal.  CIGARETTE SMOKER -He has cut down some and is now only smoking when playing golf.   Patient Instructions  -Nice seeing you today!!  -Will send you to see the cardiologist for work up of your shortness of breath.     Lelon Frohlich, MD [facility]

## 2020-02-08 NOTE — Patient Instructions (Signed)
-  Nice seeing you today!!  -Will send you to see the cardiologist for work up of your shortness of breath.

## 2020-02-12 ENCOUNTER — Ambulatory Visit (HOSPITAL_COMMUNITY)
Admission: RE | Admit: 2020-02-12 | Discharge: 2020-02-12 | Disposition: A | Discharge status: Home / Self Care | Payer: Medicare Other | Source: Ambulatory Visit | Attending: Cardiovascular Disease | Admitting: Cardiovascular Disease

## 2020-02-12 ENCOUNTER — Other Ambulatory Visit: Payer: Self-pay

## 2020-02-12 DIAGNOSIS — Z136 Encounter for screening for cardiovascular disorders: Secondary | ICD-10-CM | POA: Diagnosis not present

## 2020-02-12 DIAGNOSIS — Z87891 Personal history of nicotine dependence: Secondary | ICD-10-CM | POA: Insufficient documentation

## 2020-02-19 ENCOUNTER — Other Ambulatory Visit (INDEPENDENT_AMBULATORY_CARE_PROVIDER_SITE_OTHER): Payer: Medicare Other

## 2020-02-19 DIAGNOSIS — I10 Essential (primary) hypertension: Secondary | ICD-10-CM

## 2020-02-28 ENCOUNTER — Telehealth (HOSPITAL_COMMUNITY): Payer: Self-pay

## 2020-02-28 NOTE — Telephone Encounter (Signed)
Encounter complete. 

## 2020-02-29 ENCOUNTER — Other Ambulatory Visit (HOSPITAL_COMMUNITY)
Admission: RE | Admit: 2020-02-29 | Discharge: 2020-02-29 | Disposition: A | Discharge status: Home / Self Care | Payer: Medicare Other | Source: Ambulatory Visit | Attending: Cardiology | Admitting: Cardiology

## 2020-02-29 DIAGNOSIS — Z01812 Encounter for preprocedural laboratory examination: Secondary | ICD-10-CM | POA: Insufficient documentation

## 2020-02-29 DIAGNOSIS — Z20822 Contact with and (suspected) exposure to covid-19: Secondary | ICD-10-CM | POA: Diagnosis not present

## 2020-02-29 LAB — SARS CORONAVIRUS 2 (TAT 6-24 HRS): SARS Coronavirus 2: NEGATIVE

## 2020-03-04 ENCOUNTER — Ambulatory Visit (HOSPITAL_COMMUNITY)
Admission: RE | Admit: 2020-03-04 | Discharge: 2020-03-04 | Disposition: A | Discharge status: Home / Self Care | Payer: Medicare Other | Source: Ambulatory Visit | Attending: Cardiology | Admitting: Cardiology

## 2020-03-04 ENCOUNTER — Other Ambulatory Visit: Payer: Self-pay

## 2020-03-04 DIAGNOSIS — R0602 Shortness of breath: Secondary | ICD-10-CM | POA: Diagnosis not present

## 2020-03-04 LAB — EXERCISE TOLERANCE TEST
Estimated workload: 7 METS
Exercise duration (min): 5 min
Exercise duration (sec): 18 s
MPHR: 144 {beats}/min
Peak HR: 141 {beats}/min
Percent HR: 97 %
RPE: 17
Rest HR: 63 {beats}/min

## 2020-03-05 ENCOUNTER — Telehealth: Payer: Self-pay | Admitting: Cardiology

## 2020-03-05 ENCOUNTER — Other Ambulatory Visit (HOSPITAL_COMMUNITY): Payer: Medicare Other

## 2020-03-05 NOTE — Telephone Encounter (Signed)
Pt would like to have Dr. Percival Spanish call him to discuss the results from his stress test yesterday (03-04-20)

## 2020-03-11 NOTE — Telephone Encounter (Signed)
Will forward to Dr Percival Spanish and Odie Sera RN for review

## 2020-03-11 NOTE — Telephone Encounter (Signed)
Patient following up.

## 2020-03-13 ENCOUNTER — Telehealth: Payer: Self-pay | Admitting: Cardiology

## 2020-03-13 NOTE — Telephone Encounter (Signed)
Please see the results I sent on his test.  He is aware as I talked to him and is to have a coronary CTA.

## 2020-03-13 NOTE — Telephone Encounter (Signed)
Will forward to Dr. Rosezella [place] nurse.

## 2020-03-13 NOTE — Telephone Encounter (Signed)
New message   Patient states that he wants to know when an order will be setup for a cat scan w/dye to check for blockages. Please call to discuss.

## 2020-03-14 ENCOUNTER — Other Ambulatory Visit: Payer: Self-pay | Admitting: Internal Medicine

## 2020-03-14 ENCOUNTER — Other Ambulatory Visit: Payer: Self-pay

## 2020-03-14 DIAGNOSIS — R0602 Shortness of breath: Secondary | ICD-10-CM

## 2020-03-14 DIAGNOSIS — E785 Hyperlipidemia, unspecified: Secondary | ICD-10-CM

## 2020-03-14 DIAGNOSIS — R9430 Abnormal result of cardiovascular function study, unspecified: Secondary | ICD-10-CM

## 2020-03-14 MED ORDER — METOPROLOL TARTRATE 50 MG PO TABS
50.0000 mg | ORAL_TABLET | Freq: Once | ORAL | 0 refills | Status: DC
Start: 2020-03-14 — End: 2020-04-03

## 2020-03-14 NOTE — Telephone Encounter (Signed)
Spoke with patient. Informed him CTA has to be approved by insurance and then he will be contacted to schedule the scan. Patient verbalized understanding.

## 2020-03-14 NOTE — Progress Notes (Signed)
Coronary CT orders placed per Dr. Percival Spanish. Instructions sent to patient's mychart.

## 2020-03-14 NOTE — Telephone Encounter (Signed)
Follow Up:   Pt called back and said he needs a CT. He would like it asap. I told him as soon as an order was placed it would be scheduled. Please call today and let him know something, he says he is nervous.

## 2020-03-19 ENCOUNTER — Telehealth (HOSPITAL_COMMUNITY): Payer: Self-pay | Admitting: *Deleted

## 2020-03-19 NOTE — Telephone Encounter (Signed)
Received a phone call from pt regarding scheduling his Cardiac CT scan.  Pt informed that his insurance has not approved the scan at the moment and that we would reach out to schedule when that occurs.  Pt expressed understanding.  Pt's information given to pre-cert team to take a look.  Burley Saver, Cardiac Imaging Nurse Navigator Office: [phone]

## 2020-03-24 ENCOUNTER — Telehealth (HOSPITAL_COMMUNITY): Payer: Self-pay | Admitting: *Deleted

## 2020-03-24 ENCOUNTER — Other Ambulatory Visit: Payer: Self-pay

## 2020-03-24 DIAGNOSIS — R0602 Shortness of breath: Secondary | ICD-10-CM | POA: Diagnosis not present

## 2020-03-24 NOTE — Telephone Encounter (Signed)

## 2020-03-25 ENCOUNTER — Ambulatory Visit (HOSPITAL_COMMUNITY)
Admission: RE | Admit: 2020-03-25 | Discharge: 2020-03-25 | Disposition: A | Discharge status: Home / Self Care | Payer: Medicare Other | Source: Ambulatory Visit | Attending: Cardiology | Admitting: Cardiology

## 2020-03-25 ENCOUNTER — Other Ambulatory Visit: Payer: Self-pay

## 2020-03-25 DIAGNOSIS — R9430 Abnormal result of cardiovascular function study, unspecified: Secondary | ICD-10-CM | POA: Diagnosis not present

## 2020-03-25 DIAGNOSIS — R0602 Shortness of breath: Secondary | ICD-10-CM | POA: Insufficient documentation

## 2020-03-25 LAB — BASIC METABOLIC PANEL
BUN/Creatinine Ratio: 16 (ref 10–24)
BUN: 17 mg/dL (ref 8–27)
CO2: 21 mmol/L (ref 20–29)
Calcium: 9.7 mg/dL (ref 8.6–10.2)
Chloride: 105 mmol/L (ref 96–106)
Creatinine, Ser: 1.08 mg/dL (ref 0.76–1.27)
GFR calc Af Amer: 77 mL/min/{1.73_m2} (ref >59)
GFR calc non Af Amer: 66 mL/min/{1.73_m2} (ref >59)
Glucose: 126 mg/dL — ABNORMAL HIGH (ref 65–99)
Potassium: 4.6 mmol/L (ref 3.5–5.2)
Sodium: 141 mmol/L (ref 134–144)

## 2020-03-25 MED ORDER — NITROGLYCERIN 0.4 MG SL SUBL: 0.8000 mg | SUBLINGUAL_TABLET | Freq: Once | SUBLINGUAL | Status: AC

## 2020-03-25 MED ORDER — NITROGLYCERIN 0.4 MG SL SUBL
SUBLINGUAL_TABLET | SUBLINGUAL | Status: AC
  Administered 2020-03-25: 0.8 mg via SUBLINGUAL
  Filled 2020-03-25: qty 2

## 2020-03-25 MED ORDER — IOHEXOL 350 MG/ML SOLN
100.0000 mL | Freq: Once | INTRAVENOUS | Status: AC | PRN
  Administered 2020-03-25: 80 mL via INTRAVENOUS

## 2020-03-26 ENCOUNTER — Ambulatory Visit (HOSPITAL_COMMUNITY)
Admission: RE | Admit: 2020-03-26 | Discharge: 2020-03-26 | Disposition: A | Discharge status: Home / Self Care | Payer: Medicare Other | Source: Ambulatory Visit | Attending: Cardiology | Admitting: Cardiology

## 2020-03-26 DIAGNOSIS — R9430 Abnormal result of cardiovascular function study, unspecified: Secondary | ICD-10-CM

## 2020-03-26 DIAGNOSIS — I2510 Atherosclerotic heart disease of native coronary artery without angina pectoris: Secondary | ICD-10-CM | POA: Insufficient documentation

## 2020-03-26 DIAGNOSIS — R0602 Shortness of breath: Secondary | ICD-10-CM | POA: Insufficient documentation

## 2020-03-27 DIAGNOSIS — R9430 Abnormal result of cardiovascular function study, unspecified: Secondary | ICD-10-CM | POA: Diagnosis not present

## 2020-03-27 DIAGNOSIS — R0602 Shortness of breath: Secondary | ICD-10-CM | POA: Diagnosis not present

## 2020-03-27 DIAGNOSIS — I2510 Atherosclerotic heart disease of native coronary artery without angina pectoris: Secondary | ICD-10-CM | POA: Diagnosis not present

## 2020-04-02 ENCOUNTER — Encounter: Payer: Self-pay | Admitting: Cardiology

## 2020-04-02 DIAGNOSIS — I2510 Atherosclerotic heart disease of native coronary artery without angina pectoris: Secondary | ICD-10-CM | POA: Insufficient documentation

## 2020-04-02 NOTE — Progress Notes (Signed)
Cardiology Office Note   Date:  04/03/2020   ID:  [name], DOB 03/20/1941, MRN [id number]  PCP:  [name], Paul Halsted, MD  Cardiologist:   Paul Breeding, MD Referring:  [name], Paul Halsted, MD  Chief Complaint  Patient presents with  . Coronary Artery Disease      History of Present Illness: [name] is a 79 y.o. male who is referred by [name], Paul Halsted, MD for evaluation of SOB.  I sent him for a stress test.  He had ST depression on POET (Plain [employer] Exercise Treadmill).  I sent him for a coronary CTA.    He had a negative screen for AAA.  CTA demonstrated a calcium score of 271 which was 49th percentile.  Left main had 25 to 49% stenosis.  The LAD had 25% stenosis.  There was a ramus intermediate with less than 60% stenosis.  Circumflex had 25% and right coronary artery had 25% lesions.    Since I last saw him he has done well.  He is not having any discomfort associated with that which he described previously.  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  He has been able to be very active and he denies any chest pressure, neck or arm discomfort.  He has no shortness of breath, PND or orthopnea.  He has had no weight gain or edema   Past Medical History:  Diagnosis Date  . Bronchitis    hx of  . Cataract    BILATERAL-REMOVED  . Chronic kidney disease    stone  . Cigarette smoker   . Colon polyp   . COPD (chronic obstructive pulmonary disease) ([place])    Pt reports does not have history of COPD  . Diverticulosis of colon   . DM (diabetes mellitus) ([place])   . Gastritis   . GERD (gastroesophageal reflux disease)   . Hemorrhoids   . Hiatal hernia   . History of BPH   . History of colonic polyps - ? attenuated polyposis 07/27/2007   Adenomas in past, mother w/ probable colon cancer 2004 - none 04/2009 Polyps, multiple (6, max size 8 mm) ADENOMAS 03/2013 - 34mm adenoma (1) - 07/02/2019 11 diminutive polyps removed -  adenomas Gatha Mayer, MD, [facility]     . History of renal calculi   . Hyperlipidemia   . IBS (irritable bowel syndrome)   . Lumbar back pain     Past Surgical History:  Procedure Laterality Date  . ANTERIOR LAT LUMBAR FUSION  08/14/2012   Procedure: ANTERIOR LATERAL LUMBAR FUSION 1 LEVEL;  Surgeon: Charlie Pitter, MD;  Location: [place] [facility];  Service: Neurosurgery;  Laterality: Left;  Left lumbar three-four extreme lumbar interbody fusion with percutaneous pedicle screws   . BACK SURGERY     x 3  . [place]  . COLONOSCOPY    . COLONOSCOPY W/ BIOPSIES AND POLYPECTOMY  05/06/2009   adenomous polyp, diverticulosis, external hemorrhoids  . CYSTOSCOPY  11/03   TURP and stone removal - Dr Amalia Hailey  . dental implants  2007  . LITHOTRIPSY     x 3  . LUMBAR MICRODISCECTOMY  12/07   L3-4 by Dr Annette Stable  . LUMBAR PERCUTANEOUS PEDICLE SCREW 1 LEVEL  08/14/2012   Procedure: LUMBAR PERCUTANEOUS PEDICLE SCREW 1 LEVEL;  Surgeon: Charlie Pitter, MD;  Location: [place] [facility];  Service: Neurosurgery;  Laterality: Left;  Left lumbar three-four extreme lumbar interbody fusion with percutaneous pedicle screws   . ROTATOR CUFF REPAIR     left shoulder  . UPPER GASTROINTESTINAL ENDOSCOPY  09/07/2010   erosive gastritis, GERD  . UVULOPALATOPHARYNGOPLASTY  2001   Dr Wilburn Cornelia     Current Outpatient Medications  Medication Sig Dispense Refill  . acyclovir (ZOVIRAX) 400 MG tablet TAKE 1 TABLET(400 MG) BY MOUTH TWICE DAILY (Patient taking differently: Take 200 mg by mouth 2 (two) times daily. ) 180 tablet 1  . aspirin 81 MG tablet Take 81 mg by mouth daily.      . metFORMIN (GLUCOPHAGE) 500 MG tablet TAKE 1 TABLET BY MOUTH DAILY WITH A MEAL 90 tablet 1  . simvastatin (ZOCOR) 20 MG tablet TAKE 1 TABLET(20 MG) BY MOUTH AT BEDTIME 90 tablet 0   No current facility-administered medications for this visit.    Allergies:   Patient has no known allergies.    ROS:  Please see the history  of present illness.   Otherwise, review of systems are positive for none.   All other systems are reviewed and negative.    PHYSICAL EXAM: VS:  BP 128/78   Pulse 76   Temp 97.7 F (36.5 C)   Ht 5\' 11"  (1.803 m)   Wt 172 lb 3.2 oz (78.1 kg)   SpO2 96%   BMI 24.02 kg/m  , BMI Body mass index is 24.02 kg/m. GENERAL:  Well appearing NECK:  No jugular venous distention, waveform within normal limits, carotid upstroke brisk and symmetric, no bruits, no thyromegaly LUNGS:  Clear to auscultation bilaterally CHEST:  Unremarkable HEART:  PMI not displaced or sustained,S1 and S2 within normal limits, no S3, no S4, no clicks, no rubs, no murmurs ABD:  Flat, positive bowel sounds normal in frequency in pitch, no bruits, no rebound, no guarding, no midline pulsatile mass, no hepatomegaly, no splenomegaly EXT:  2 plus pulses throughout, no edema, no cyanosis no clubbing    EKG:  EKG is not ordered today.   Recent Labs: 06/15/2019: ALT 14; Hemoglobin 14.3; Platelets 216.0; TSH 3.99 03/24/2020: BUN 17; Creatinine, Ser 1.08; Potassium 4.6; Sodium 141    Lipid Panel    Component Value Date/Time   CHOL 121 06/15/2019 0814   TRIG 75.0 06/15/2019 0814   HDL 46.20 06/15/2019 0814   CHOLHDL 3 06/15/2019 0814   VLDL 15.0 06/15/2019 0814   LDLCALC 60 06/15/2019 0814      Wt Readings from Last 3 Encounters:  04/03/20 172 lb 3.2 oz (78.1 kg)  02/08/20 171 lb (77.6 kg)  02/08/20 171 lb 11.2 oz (77.9 kg)      Other studies Reviewed: Additional studies/ records that were reviewed today include: None. Review of the above records demonstrates:   NA   ASSESSMENT AND PLAN:  CAD:    We had a long discussion about his results and I reviewed the anatomy in detail.  At this point he has no symptoms.  He had no high risk findings on treadmill testing.  He has no obstructive disease as noted by CT. I will follow him again and likely repeat treadmill testing in a couple of years.  DM: A1c was 6.1.  No  change in therapy.   COVID EDUCATION:  He has had his vaccine.    DYSLIPIDEMIA: His LDL was at target but this was last summer.  He is can have this checked again clearly with a goal LDL less than 70.  It is really as  good as it was when I would leave him on the simvastatin.  However, if it is at all elevated I would suggest switching to a moderate dose 40 mg Lipitor or 20 mg of Crestor.    Current medicines are reviewed at length with the patient today.  The patient does not have concerns regarding medicines.  The following changes have been made:  no change  Labs/ tests ordered today include: None  No orders of the defined types were placed in this encounter.    Disposition:   FU with me in two years. Ronnell Guadalajara, MD  04/03/2020 12:36 PM    [place]

## 2020-04-03 ENCOUNTER — Ambulatory Visit (INDEPENDENT_AMBULATORY_CARE_PROVIDER_SITE_OTHER): Payer: Medicare Other | Admitting: Cardiology

## 2020-04-03 ENCOUNTER — Other Ambulatory Visit: Payer: Self-pay

## 2020-04-03 ENCOUNTER — Encounter: Payer: Self-pay | Admitting: Cardiology

## 2020-04-03 VITALS — BP 128/78 | HR 76 | Temp 97.7°F | Ht 71.0 in | Wt 172.2 lb

## 2020-04-03 DIAGNOSIS — E785 Hyperlipidemia, unspecified: Secondary | ICD-10-CM

## 2020-04-03 DIAGNOSIS — E118 Type 2 diabetes mellitus with unspecified complications: Secondary | ICD-10-CM

## 2020-04-03 DIAGNOSIS — I2510 Atherosclerotic heart disease of native coronary artery without angina pectoris: Secondary | ICD-10-CM | POA: Diagnosis not present

## 2020-04-03 NOTE — Patient Instructions (Signed)
Medication Instructions:  NO CHANGES *If you need a refill on your cardiac medications before your next appointment, please call your pharmacy*  Lab Work: NONE ORDERED THIS VISIT  Testing/Procedures: NONE ORDERED THIS VISIT  Follow-Up: At [facility], you and your health needs are our priority.  As part of our continuing mission to provide you with exceptional heart care, we have created designated Provider Care Teams.  These Care Teams include your primary Cardiologist (physician) and Advanced Practice Providers (APPs -  Physician Assistants and Nurse Practitioners) who all work together to provide you with the care you need, when you need it.  Your next appointment:   2 year(s)   You will receive a reminder letter in the mail two months in advance. If you don't receive a letter, please call our office to schedule the follow-up appointment.  The format for your next appointment:   In Person  Provider:   Minus Breeding, MD

## 2020-05-14 ENCOUNTER — Other Ambulatory Visit: Payer: Self-pay | Admitting: Internal Medicine

## 2020-05-14 DIAGNOSIS — E785 Hyperlipidemia, unspecified: Secondary | ICD-10-CM

## 2020-05-15 DIAGNOSIS — N200 Calculus of kidney: Secondary | ICD-10-CM | POA: Diagnosis not present

## 2020-05-15 DIAGNOSIS — N400 Enlarged prostate without lower urinary tract symptoms: Secondary | ICD-10-CM | POA: Diagnosis not present

## 2020-05-15 DIAGNOSIS — R3121 Asymptomatic microscopic hematuria: Secondary | ICD-10-CM | POA: Diagnosis not present

## 2020-05-15 DIAGNOSIS — N5201 Erectile dysfunction due to arterial insufficiency: Secondary | ICD-10-CM | POA: Diagnosis not present

## 2020-06-03 DIAGNOSIS — N132 Hydronephrosis with renal and ureteral calculous obstruction: Secondary | ICD-10-CM | POA: Diagnosis not present

## 2020-06-03 DIAGNOSIS — N5201 Erectile dysfunction due to arterial insufficiency: Secondary | ICD-10-CM | POA: Diagnosis not present

## 2020-06-03 DIAGNOSIS — I700 Atherosclerosis of aorta: Secondary | ICD-10-CM | POA: Diagnosis not present

## 2020-06-03 DIAGNOSIS — N281 Cyst of kidney, acquired: Secondary | ICD-10-CM | POA: Diagnosis not present

## 2020-06-03 DIAGNOSIS — K4090 Unilateral inguinal hernia, without obstruction or gangrene, not specified as recurrent: Secondary | ICD-10-CM | POA: Diagnosis not present

## 2020-06-03 DIAGNOSIS — N202 Calculus of kidney with calculus of ureter: Secondary | ICD-10-CM | POA: Diagnosis not present

## 2020-06-05 ENCOUNTER — Other Ambulatory Visit: Payer: Self-pay | Admitting: Urology

## 2020-06-16 DIAGNOSIS — Z961 Presence of intraocular lens: Secondary | ICD-10-CM | POA: Diagnosis not present

## 2020-06-16 DIAGNOSIS — H524 Presbyopia: Secondary | ICD-10-CM | POA: Diagnosis not present

## 2020-06-16 DIAGNOSIS — H35373 Puckering of macula, bilateral: Secondary | ICD-10-CM | POA: Diagnosis not present

## 2020-06-16 DIAGNOSIS — E119 Type 2 diabetes mellitus without complications: Secondary | ICD-10-CM | POA: Diagnosis not present

## 2020-06-16 LAB — HM DIABETES EYE EXAM

## 2020-06-17 ENCOUNTER — Encounter: Payer: Self-pay | Admitting: Internal Medicine

## 2020-06-17 ENCOUNTER — Other Ambulatory Visit (HOSPITAL_COMMUNITY): Payer: Medicare Other

## 2020-06-17 ENCOUNTER — Other Ambulatory Visit (HOSPITAL_COMMUNITY)
Admission: RE | Admit: 2020-06-17 | Discharge: 2020-06-17 | Disposition: A | Discharge status: Home / Self Care | Payer: Medicare Other | Source: Ambulatory Visit | Attending: Urology | Admitting: Urology

## 2020-06-17 DIAGNOSIS — Z01812 Encounter for preprocedural laboratory examination: Secondary | ICD-10-CM | POA: Insufficient documentation

## 2020-06-17 DIAGNOSIS — Z20822 Contact with and (suspected) exposure to covid-19: Secondary | ICD-10-CM | POA: Insufficient documentation

## 2020-06-17 DIAGNOSIS — D485 Neoplasm of uncertain behavior of skin: Secondary | ICD-10-CM | POA: Diagnosis not present

## 2020-06-17 DIAGNOSIS — D0421 Carcinoma in situ of skin of right ear and external auricular canal: Secondary | ICD-10-CM | POA: Diagnosis not present

## 2020-06-17 DIAGNOSIS — L720 Epidermal cyst: Secondary | ICD-10-CM | POA: Diagnosis not present

## 2020-06-17 DIAGNOSIS — D225 Melanocytic nevi of trunk: Secondary | ICD-10-CM | POA: Diagnosis not present

## 2020-06-17 DIAGNOSIS — L821 Other seborrheic keratosis: Secondary | ICD-10-CM | POA: Diagnosis not present

## 2020-06-17 DIAGNOSIS — L578 Other skin changes due to chronic exposure to nonionizing radiation: Secondary | ICD-10-CM | POA: Diagnosis not present

## 2020-06-17 DIAGNOSIS — L570 Actinic keratosis: Secondary | ICD-10-CM | POA: Diagnosis not present

## 2020-06-17 LAB — SARS CORONAVIRUS 2 (TAT 6-24 HRS): SARS Coronavirus 2: NEGATIVE

## 2020-06-18 ENCOUNTER — Other Ambulatory Visit: Payer: Self-pay

## 2020-06-18 ENCOUNTER — Encounter (HOSPITAL_BASED_OUTPATIENT_CLINIC_OR_DEPARTMENT_OTHER): Payer: Self-pay | Admitting: Urology

## 2020-06-18 NOTE — Progress Notes (Addendum)
Spoke w/ via phone for pre-op interview---pt Lab needs dos----    I stat 8           COVID test ------06-17-2020 at 1100 Arrive at -------915 am 06-20-2020 NPO after MN NO Solid Food.  Clear liquids from MN until---815 am Medications to take morning of surgery -----none Diabetic medication -----none day of surgery Patient Special Instructions -----none Pre-Op special Istructions -----none Patient verbalized understanding of instructions that were given at this phone interview. Patient denies shortness of breath, chest pain, fever, cough at this phone interview.  Anesthesia Review: no  PCP:dr  Isaac Bliss Cardiologist : dr Rhae Hammock 04-03-2020 ( follow up in 2 years) Chest ct: 07-04-2019 epic EKG :02-08-2020 epic Echo :none Exercise tolerance test 03-04-2020 epic Stress test:n/a Cardiac Cath : none Ct coronary 03-27-2020 epic Activity level: climbs stairs and dose housework without problwems Sleep Study/ CPAP :none Fasting Blood Sugar : "normal per pt"     / Checks Blood Sugar --"once in w while" Blood Thinner/ Instructions /Last Dose:n/a ASA / Instructions/ Last Dose : staying on 81 mg aspirin per dr Tresa Moore last dose will be 06-19-2020

## 2020-06-19 NOTE — Anesthesia Preprocedure Evaluation (Addendum)
Anesthesia Evaluation  Patient identified by MRN, date of birth, ID band Patient awake    Reviewed: Allergy & Precautions, NPO status , Patient's Chart, lab work & pertinent test results  Airway Mallampati: II  TM Distance: >3 FB Neck ROM: Full    Dental no notable dental hx. (+) Dental Advisory Given, Upper Dentures, Poor Dentition,    Pulmonary COPD, former smoker,    Pulmonary exam normal breath sounds clear to auscultation       Cardiovascular hypertension, + CAD  Normal cardiovascular exam Rhythm:Regular Rate:Normal  Negative stress test   Neuro/Psych Anxiety negative neurological ROS     GI/Hepatic GERD  ,  Endo/Other  diabetes, Well Controlled, Type 2, Oral Hypoglycemic Agents  Renal/GU negative Renal ROS     Musculoskeletal  (+) Arthritis ,   Abdominal   Peds  Hematology   Anesthesia Other Findings   Reproductive/Obstetrics                           Anesthesia Physical Anesthesia Plan  ASA: III  Anesthesia Plan: General   Post-op Pain Management:    Induction: Intravenous  PONV Risk Score and Plan: 3 and Treatment may vary due to age or medical condition, Ondansetron and Dexamethasone  Airway Management Planned: LMA  Additional Equipment: None  Intra-op Plan:   Post-operative Plan:   Informed Consent: I have reviewed the patients History and Physical, chart, labs and discussed the procedure including the risks, benefits and alternatives for the proposed anesthesia with the patient or authorized representative who has indicated his/her understanding and acceptance.     Dental advisory given  Plan Discussed with: CRNA and Anesthesiologist  Anesthesia Plan Comments:        Anesthesia Quick Evaluation

## 2020-06-20 ENCOUNTER — Ambulatory Visit (HOSPITAL_BASED_OUTPATIENT_CLINIC_OR_DEPARTMENT_OTHER)
Admission: RE | Admit: 2020-06-20 | Discharge: 2020-06-20 | Disposition: A | Discharge status: Home / Self Care | Payer: Medicare Other | Attending: Urology | Admitting: Urology

## 2020-06-20 ENCOUNTER — Encounter (HOSPITAL_BASED_OUTPATIENT_CLINIC_OR_DEPARTMENT_OTHER)
Admission: RE | Disposition: A | Discharge status: Home / Self Care | Payer: Self-pay | Source: Home / Self Care | Attending: Urology

## 2020-06-20 ENCOUNTER — Ambulatory Visit (HOSPITAL_BASED_OUTPATIENT_CLINIC_OR_DEPARTMENT_OTHER): Payer: Medicare Other | Admitting: Anesthesiology

## 2020-06-20 ENCOUNTER — Encounter (HOSPITAL_BASED_OUTPATIENT_CLINIC_OR_DEPARTMENT_OTHER): Payer: Self-pay | Admitting: Urology

## 2020-06-20 DIAGNOSIS — Z7984 Long term (current) use of oral hypoglycemic drugs: Secondary | ICD-10-CM | POA: Diagnosis not present

## 2020-06-20 DIAGNOSIS — Z7982 Long term (current) use of aspirin: Secondary | ICD-10-CM | POA: Diagnosis not present

## 2020-06-20 DIAGNOSIS — I2510 Atherosclerotic heart disease of native coronary artery without angina pectoris: Secondary | ICD-10-CM | POA: Diagnosis not present

## 2020-06-20 DIAGNOSIS — N201 Calculus of ureter: Secondary | ICD-10-CM | POA: Diagnosis present

## 2020-06-20 DIAGNOSIS — N202 Calculus of kidney with calculus of ureter: Secondary | ICD-10-CM | POA: Diagnosis not present

## 2020-06-20 DIAGNOSIS — E785 Hyperlipidemia, unspecified: Secondary | ICD-10-CM | POA: Diagnosis not present

## 2020-06-20 DIAGNOSIS — J449 Chronic obstructive pulmonary disease, unspecified: Secondary | ICD-10-CM | POA: Insufficient documentation

## 2020-06-20 DIAGNOSIS — Z87891 Personal history of nicotine dependence: Secondary | ICD-10-CM | POA: Insufficient documentation

## 2020-06-20 DIAGNOSIS — Z79899 Other long term (current) drug therapy: Secondary | ICD-10-CM | POA: Insufficient documentation

## 2020-06-20 DIAGNOSIS — K219 Gastro-esophageal reflux disease without esophagitis: Secondary | ICD-10-CM | POA: Diagnosis not present

## 2020-06-20 DIAGNOSIS — K589 Irritable bowel syndrome without diarrhea: Secondary | ICD-10-CM | POA: Insufficient documentation

## 2020-06-20 DIAGNOSIS — E119 Type 2 diabetes mellitus without complications: Secondary | ICD-10-CM | POA: Diagnosis not present

## 2020-06-20 DIAGNOSIS — Z87442 Personal history of urinary calculi: Secondary | ICD-10-CM | POA: Insufficient documentation

## 2020-06-20 HISTORY — DX: Personal history of urinary calculi: Z87.442

## 2020-06-20 HISTORY — DX: Atherosclerotic heart disease of native coronary artery without angina pectoris: I25.10

## 2020-06-20 HISTORY — DX: Prediabetes: R73.03

## 2020-06-20 HISTORY — PX: CYSTOSCOPY WITH RETROGRADE PYELOGRAM, URETEROSCOPY AND STENT PLACEMENT: SHX5789

## 2020-06-20 HISTORY — PX: HOLMIUM LASER APPLICATION: SHX5852

## 2020-06-20 LAB — POCT I-STAT, CHEM 8
BUN: 21 mg/dL (ref 8–23)
BUN: 29 mg/dL — ABNORMAL HIGH (ref 8–23)
BUN: 29 mg/dL — ABNORMAL HIGH (ref 8–23)
Calcium, Ion: 1.22 mmol/L (ref 1.15–1.40)
Calcium, Ion: 1.23 mmol/L (ref 1.15–1.40)
Calcium, Ion: 1.33 mmol/L (ref 1.15–1.40)
Chloride: 103 mmol/L (ref 98–111)
Chloride: 104 mmol/L (ref 98–111)
Chloride: 104 mmol/L (ref 98–111)
Creatinine, Ser: 1 mg/dL (ref 0.61–1.24)
Creatinine, Ser: 1 mg/dL (ref 0.61–1.24)
Creatinine, Ser: 1 mg/dL (ref 0.61–1.24)
Glucose, Bld: 133 mg/dL — ABNORMAL HIGH (ref 70–99)
Glucose, Bld: 133 mg/dL — ABNORMAL HIGH (ref 70–99)
Glucose, Bld: 134 mg/dL — ABNORMAL HIGH (ref 70–99)
HCT: 43 % (ref 39.0–52.0)
HCT: 44 % (ref 39.0–52.0)
HCT: 44 % (ref 39.0–52.0)
Hemoglobin: 14.6 g/dL (ref 13.0–17.0)
Hemoglobin: 15 g/dL (ref 13.0–17.0)
Hemoglobin: 15 g/dL (ref 13.0–17.0)
Potassium: 4.2 mmol/L (ref 3.5–5.1)
Potassium: 6 mmol/L — ABNORMAL HIGH (ref 3.5–5.1)
Potassium: 6 mmol/L — ABNORMAL HIGH (ref 3.5–5.1)
Sodium: 139 mmol/L (ref 135–145)
Sodium: 139 mmol/L (ref 135–145)
Sodium: 141 mmol/L (ref 135–145)
TCO2: 24 mmol/L (ref 22–32)
TCO2: 27 mmol/L (ref 22–32)
TCO2: 27 mmol/L (ref 22–32)

## 2020-06-20 SURGERY — CYSTOURETEROSCOPY, WITH RETROGRADE PYELOGRAM AND STENT INSERTION
Anesthesia: General | Laterality: Left

## 2020-06-20 MED ORDER — SENNOSIDES-DOCUSATE SODIUM 8.6-50 MG PO TABS: 1.0000 | ORAL_TABLET | Freq: Two times a day (BID) | ORAL | 0 refills | Status: DC

## 2020-06-20 MED ORDER — SODIUM CHLORIDE 0.9 % IR SOLN
Status: DC | PRN
  Administered 2020-06-20: 3000 mL

## 2020-06-20 MED ORDER — ONDANSETRON HCL 4 MG/2ML IJ SOLN
INTRAMUSCULAR | Status: DC | PRN
  Administered 2020-06-20: 4 mg via INTRAVENOUS

## 2020-06-20 MED ORDER — KETOROLAC TROMETHAMINE 10 MG PO TABS: 10.0000 mg | ORAL_TABLET | Freq: Three times a day (TID) | ORAL | 0 refills | Status: DC | PRN

## 2020-06-20 MED ORDER — SODIUM CHLORIDE 0.9 % IR SOLN
Status: DC | PRN
  Administered 2020-06-20: 1000 mL

## 2020-06-20 MED ORDER — FENTANYL CITRATE (PF) 100 MCG/2ML IJ SOLN
INTRAMUSCULAR | Status: AC
  Filled 2020-06-20: qty 2

## 2020-06-20 MED ORDER — FENTANYL CITRATE (PF) 250 MCG/5ML IJ SOLN
INTRAMUSCULAR | Status: DC | PRN
Start: 2020-06-20 — End: 2020-06-20
  Administered 2020-06-20: 25 ug via INTRAVENOUS

## 2020-06-20 MED ORDER — LACTATED RINGERS IV SOLN: INTRAVENOUS | Status: DC

## 2020-06-20 MED ORDER — ONDANSETRON HCL 4 MG/2ML IJ SOLN: 4.0000 mg | Freq: Once | INTRAMUSCULAR | Status: DC | PRN

## 2020-06-20 MED ORDER — ACETAMINOPHEN 10 MG/ML IV SOLN: 1000.0000 mg | Freq: Once | INTRAVENOUS | Status: DC | PRN

## 2020-06-20 MED ORDER — IOHEXOL 300 MG/ML  SOLN
INTRAMUSCULAR | Status: DC | PRN
  Administered 2020-06-20: 27 mL via URETHRAL

## 2020-06-20 MED ORDER — PROPOFOL 10 MG/ML IV BOLUS
INTRAVENOUS | Status: DC | PRN
  Administered 2020-06-20: 130 mg via INTRAVENOUS

## 2020-06-20 MED ORDER — EPHEDRINE SULFATE-NACL 50-0.9 MG/10ML-% IV SOSY
PREFILLED_SYRINGE | INTRAVENOUS | Status: DC | PRN
  Administered 2020-06-20: 10 mg via INTRAVENOUS

## 2020-06-20 MED ORDER — OXYCODONE-ACETAMINOPHEN 5-325 MG PO TABS
1.0000 | ORAL_TABLET | Freq: Three times a day (TID) | ORAL | 0 refills | Status: DC | PRN
Start: 2020-06-20 — End: 2021-05-27

## 2020-06-20 MED ORDER — FENTANYL CITRATE (PF) 100 MCG/2ML IJ SOLN: 25.0000 ug | INTRAMUSCULAR | Status: DC | PRN

## 2020-06-20 MED ORDER — CEPHALEXIN 500 MG PO CAPS: 500.0000 mg | ORAL_CAPSULE | Freq: Two times a day (BID) | ORAL | 0 refills | Status: DC

## 2020-06-20 MED ORDER — DEXAMETHASONE SODIUM PHOSPHATE 10 MG/ML IJ SOLN
INTRAMUSCULAR | Status: DC | PRN
  Administered 2020-06-20: 5 mg via INTRAVENOUS

## 2020-06-20 MED ORDER — GENTAMICIN SULFATE 40 MG/ML IJ SOLN
5.0000 mg/kg | INTRAVENOUS | Status: AC
  Administered 2020-06-20: 390 mg via INTRAVENOUS
  Filled 2020-06-20: qty 9.75

## 2020-06-20 MED ORDER — ONDANSETRON HCL 4 MG/2ML IJ SOLN
INTRAMUSCULAR | Status: AC
  Filled 2020-06-20: qty 2

## 2020-06-20 MED ORDER — LIDOCAINE 2% (20 MG/ML) 5 ML SYRINGE
INTRAMUSCULAR | Status: DC | PRN
  Administered 2020-06-20: 80 mg via INTRAVENOUS

## 2020-06-20 MED ORDER — DEXAMETHASONE SODIUM PHOSPHATE 10 MG/ML IJ SOLN
INTRAMUSCULAR | Status: AC
  Filled 2020-06-20: qty 1

## 2020-06-20 SURGICAL SUPPLY — 25 items
BAG DRAIN URO-CYSTO SKYTR STRL (DRAIN) ×3 IMPLANT
BAG DRN UROCATH (DRAIN) ×2
BASKET LASER NITINOL 1.9FR (BASKET) ×1 IMPLANT
BSKT STON RTRVL 120 1.9FR (BASKET) ×2
CATH INTERMIT  6FR 70CM (CATHETERS) IMPLANT
CLOTH BEACON ORANGE TIMEOUT ST (SAFETY) ×3 IMPLANT
FIBER LASER FLEXIVA 365 (UROLOGICAL SUPPLIES) IMPLANT
FIBER LASER TRAC TIP (UROLOGICAL SUPPLIES) IMPLANT
GLOVE BIO SURGEON STRL SZ7.5 (GLOVE) ×3 IMPLANT
GOWN STRL REUS W/TWL LRG LVL3 (GOWN DISPOSABLE) ×3 IMPLANT
GUIDEWIRE ANG ZIPWIRE 038X150 (WIRE) ×3 IMPLANT
GUIDEWIRE STR DUAL SENSOR (WIRE) ×4 IMPLANT
IV NS 1000ML (IV SOLUTION) ×3
IV NS 1000ML BAXH (IV SOLUTION) ×2 IMPLANT
IV NS IRRIG 3000ML ARTHROMATIC (IV SOLUTION) ×3 IMPLANT
KIT TURNOVER CYSTO (KITS) ×3 IMPLANT
MANIFOLD NEPTUNE II (INSTRUMENTS) ×3 IMPLANT
NS IRRIG 500ML POUR BTL (IV SOLUTION) ×3 IMPLANT
PACK CYSTO (CUSTOM PROCEDURE TRAY) ×3 IMPLANT
SHEATH URETERAL 12FRX35CM (MISCELLANEOUS) ×1 IMPLANT
STENT POLARIS 5FRX24 (STENTS) ×1 IMPLANT
SYR 10ML LL (SYRINGE) ×3 IMPLANT
TUBE CONNECTING 12X1/4 (SUCTIONS) ×3 IMPLANT
TUBE FEEDING 8FR 16IN STR KANG (MISCELLANEOUS) IMPLANT
TUBING UROLOGY SET (TUBING) ×3 IMPLANT

## 2020-06-20 NOTE — Brief Op Note (Signed)
06/20/2020  11:36 AM  PATIENT:  [name]  79 y.o. male  PRE-OPERATIVE DIAGNOSIS:  LEFT URETERAL STONE , HEMATURIA  POST-OPERATIVE DIAGNOSIS:  LEFT URETERAL STONE , HEMATURIA  PROCEDURE:  Procedure(s) with comments: CYSTOSCOPY WITH BILATERAL  RETROGRADE PYELOGRAM, LEFT URETEROSCOPY AND STENT PLACEMENT (Bilateral) - 75 MINS HOLMIUM LASER APPLICATION (Left)  SURGEON:  Surgeon(s) and Role:    Alexis Frock, MD - Primary  PHYSICIAN ASSISTANT:   ASSISTANTS: none   ANESTHESIA:   general  EBL:  minimal   BLOOD ADMINISTERED:none  DRAINS: none   LOCAL MEDICATIONS USED:  NONE  SPECIMEN:  Source of Specimen:  Left renal stone fragments  DISPOSITION OF SPECIMEN:  [facility] for compositional analysis  COUNTS:  YES  TOURNIQUET:  * No tourniquets in log *  DICTATION: .Other Dictation: Dictation Number [id number]  PLAN OF CARE: Discharge to home after PACU  PATIENT DISPOSITION:  PACU - hemodynamically stable.   Delay start of Pharmacological VTE agent (>24hrs) due to surgical blood loss or risk of bleeding: yes

## 2020-06-20 NOTE — Op Note (Signed)
NAMETHEOTIS, GERDEMAN MEDICAL RECORD [id number] ACCOUNT [id number] DATE OF BIRTH:[birth year] FACILITY: [facility] LOCATION: [facility] PHYSICIAN:Deshan Hemmelgarn Tresa Moore, MD  OPERATIVE REPORT  DATE OF PROCEDURE:  06/20/2020  SURGEON:  Alexis Frock, MD  PREOPERATIVE DIAGNOSIS:  Left ureteral and renal stones, history of hematuria.  PROCEDURE: 1.  Cystoscopy, bilateral pyelograms, interpretation. 2.  Left ureteroscopy with laser lithotripsy. 3.  Insertion of left ureteral stent, 5 x 24 Polaris.  ESTIMATED BLOOD LOSS:  Nil.  COMPLICATIONS:  None.  SPECIMENS:  Left renal stone fragments for composition analysis.  FINDINGS: 1.  Interval passage of left ureteral stone. 2.  Multifocal left renal stones, total volume approximately 1 cm. 3.  Resolution of all accessible stone fragments larger than one-third mm following laser lithotripsy and basket extraction on the left. 4.  Successful placement of left ureteral stent, proximal end in renal pelvis, distal end in urinary bladder with tether. 5.  Unremarkable right retrograde pyelogram.  INDICATIONS:  The patient is a very pleasant and quite vigorous 79 year old man who was found on workup for gross hematuria, flank pain to have a left distal ureteral stone as well as multifocal left intrarenal stones.  He does have a history of  recurrent urolithiasis.  Options were discussed for management including medical therapy versus shockwave lithotripsy versus ureteroscopy and he wished to proceed with ureteroscopy with goal of stone free.  Informed consent was obtained and placed in the  medical record.  DESCRIPTION OF PROCEDURE:  The patient being identified, the procedure being cystoscopy, bilateral retrogrades and left ureteroscopic stone manipulation was confirmed.  Procedure timeout was performed.  Intravenous antibiotics administered.  General  anesthesia introduced.  The patient was placed into a low lithotomy position, sterile field was created,  prepped and draped base of the penis, perineum and proximal thighs using iodine.  Cystourethroscopy was then performed using 21-French rigid  cystoscope with offset lens.  Inspection of anterior and posterior urethra revealed a prior TURP defect, wide open urinary channel in the prostatic fossa.  Inspection of the urinary bladder revealed mild trabeculation.  Ureteral orifices were singleton  bilaterally.  The right ureteral orifice was cannulated with a 6-French renal catheter and right retrograde pyelogram was obtained.  Right retrograde pyelogram demonstrated a single right ureter, single system right kidney.  No filling defects or narrowing noted.  Similarly, left retrograde pyelogram was obtained.  Left retrograde pyelogram demonstrated a single left ureter single, system left kidney.  There were calcifications seen in the lower pole.  No filling defects noted.  A 0.03 ZIPwire was advanced to lower pole and set aside as a safety wire.  An 8-French  feeding tube was placed in the urinary bladder for pressure release and semirigid ureteroscopy performed in the distal four-fifths left ureter alongside a separate sensor working wire.  There was no evidence of intraluminal stone whatsoever.  There were  several areas of mild mucosal edema, likely at sites of prior stone impaction.  However, this appeared to demonstrate interval passage or retrograde positioning of prior ureteral stone.  As the goal today was to render stone free, the semirigid scope was  exchanged for a 12/14, 35 cm ureteral access sheath using continuous fluoroscopic guidance to the level of the proximal ureter and flexible digital ureteroscopy performed of the proximal left ureter and systematic inspection of the left kidney,  including all calices x3.  There were multifocal lower pole stones noted, one tiny mid pole papillary tip calcification.  The papillary tip calcification was amenable to simple  basketing.  It was removed.  The  lower pole stones, 3 of them, were much too  large for simple basketing.  They were basketed and repositioned into an upper pole calix to allow for less acute angulation and then holmium laser energy was applied to the stone using settings of 0.2 joules and 20 Hz.  Using a dusting technique, these  stones were ablated for approximately 60% of their volume, the remaining 40% fragmented into pieces of 1-2 mm that were then amenable to simple basketing with the Escape basket on the long axis.  Following this, complete resolution of all accessible  stone fragments larger than one-third mm with excellent hemostasis.  No evidence of renal perforation.  The access sheath was removed under continuous vision.  No significant mucosal abnormalities were found.  Given access sheath usage, it was felt that  brief interval stenting with tethered stent would be warranted.  As such, a new 5 x 24 Polaris-type stent was placed with remaining safety wire using fluoroscopic guidance.  Good proximal and distal planes were noted.  Tether was left in place and  fashioned to the dorsum of the penis and the procedure was terminated.  The patient tolerated the procedure well.  There were no immediate perioperative complications.  The patient was taken to the postanesthesia care unit in stable condition.  Plan for  discharge home.  VN/NUANCE  D:06/20/2020 T:06/20/2020 JOB:[id number]

## 2020-06-20 NOTE — H&P (Signed)
[name] is an 79 y.o. male.    Chief Complaint: Pre-Op LEFT Ureteroscopic Stone Manipulation  HPI:   1 -Left Ureteral / renal Stones - 64m left distal stone and few small ?parenchymal renal stones by CT 05/2020 on eval flank pain and hematuria.   Today "BMikki Werner is seen to proceed with LEFT ureteroscopy with goal of left side stone free. No interval fevers. C19 screen negative. Most recent UA without significant infectious parameters.   Past Medical History:  Diagnosis Date  . Cataract    BILATERAL-REMOVED  . Cigarette smoker   . Colon polyp   . COPD (ch[place] obstructive pulmonary disease) ([place]    Pt reports does not have history of COPD  . Coronary artery disease    nonobstructive  . Diverticulosis of colon   . Gastritis   . Hemorrhoids   . Hiatal hernia   . History of colonic polyps - ? attenuated polyposis 07/27/2007   Adenomas in past, mother w/ probable colon cancer 2004 - none 04/2009 Polyps, multiple (6, max size 8 mm) ADENOMAS 03/2013 - 629madenoma (1) - 07/02/2019 11 diminutive polyps removed - adenomas CaGatha MayerMD, FAMarval Regal   . History of kidney stones   . History of renal calculi   . Hyperlipidemia   . IBS (irritable bowel syndrome)   . Lumbar back pain   . Pre-diabetes     Past Surgical History:  Procedure Laterality Date  . ANTERIOR LAT LUMBAR FUSION  08/14/2012   Procedure: ANTERIOR LATERAL LUMBAR FUSION 1 LEVEL;  Surgeon: HeCharlie PitterMD;  [facility];  Service: Neurosurgery;  Laterality: Left;  Left lumbar three-four extreme lumbar interbody fusion with percutaneous pedicle screws  . BACK SURGERY     x 4  [place]  . COLONOSCOPY  2021   with polyp removed x 3 or 4  . COLONOSCOPY W/ BIOPSIES AND POLYPECTOMY  05/06/2009   adenomous polyp, diverticulosis, external hemorrhoids  . CYSTOSCOPY  11/03   TURP and stone removal - Dr EvAmalia Hailey. dental implants  2007  . EYE SURGERY  Bilateral 2019   cataracts both eyes  . LITHOTRIPSY     x 3  . LUMBAR MICRODISCECTOMY  12/07   L3-4 by Dr PoAnnette Stable. LUMBAR PERCUTANEOUS PEDICLE SCREW 1 LEVEL  08/14/2012   Procedure: LUMBAR PERCUTANEOUS PEDICLE SCREW 1 LEVEL;  Surgeon: HeCharlie PitterMD;  [facility] [facility];  Service: Neurosurgery;  Laterality: Left;  Left lumbar three-four extreme lumbar interbody fusion with percutaneous pedicle screws   . ROTATOR CUFF REPAIR     left shoulder 2014, right shoulder 2018  . UPPER GASTROINTESTINAL ENDOSCOPY  09/07/2010   erosive gastritis, GERD  . UVULOPALATOPHARYNGOPLASTY  2001   Dr ShWilburn Cornelia  Family History  Problem Relation Age of Onset  . Diabetes Father   . Kidney failure Father   . Colon cancer Mother        ? she had met dis and chemoRx  . Prostate cancer Brother   . Esophageal cancer Neg Hx   . Rectal cancer Neg Hx   . Stomach cancer Neg Hx    Social History:  reports that he quit smoking about 3 years ago. His smoking use included cigarettes. He has a 40.80 pack-year smoking history. He has never used smokeless tobacco. He reports current alcohol use of about 3.0 standard drinks  of alcohol per week. He reports that he does not use drugs.  Allergies: No Known Allergies  No medications prior to admission.    No results found for this or any previous visit (from the past 48 hour(s)). No results found.  Review of Systems  Constitutional: Negative for chills and fever.  Genitourinary: Positive for flank pain and hematuria.  All other systems reviewed and are negative.   Height 5' 11" (1.803 m), weight 77.1 kg. Physical Exam Vitals reviewed.  HENT:     Head: Normocephalic.     Nose: Nose normal.     Mouth/Throat:     Mouth: Mucous membranes are moist.  Cardiovascular:     Rate and Rhythm: Normal rate.     Pulses: Normal pulses.  Pulmonary:     Effort: Pulmonary effort is normal.  Abdominal:     General: Abdomen is flat.  Genitourinary:    Comments:  Mild left CVAT at present.  Musculoskeletal:        General: Normal range of motion.     Cervical back: Normal range of motion.  Neurological:     General: No focal deficit present.     Mental Status: He is alert.      Assessment/Plan  Proceed as planned with LEFT ureteroscopic stone manipulation and bilateral retrogrades. Risks, benefits, alternatives, expected peri-op course discussed previously and reiterated today.   Alexis Frock, MD 06/20/2020, 5:45 AM

## 2020-06-20 NOTE — Transfer of Care (Signed)
Immediate Anesthesia Transfer of Care Note  Patient: [name]  Procedure(s) Performed: CYSTOSCOPY WITH BILATERAL  RETROGRADE PYELOGRAM, LEFT URETEROSCOPY AND STENT PLACEMENT (Bilateral ) HOLMIUM LASER APPLICATION (Left )  Patient Location: PACU  Anesthesia Type:General  Level of Consciousness: sedated, patient cooperative and responds to stimulation  Airway & Oxygen Therapy: Patient Spontanous Breathing and Patient connected to face mask oxygen  Post-op Assessment: Report given to RN, Post -op Vital signs reviewed and stable and Patient moving all extremities  Post vital signs: Reviewed and stable  Last Vitals:  Vitals Value Taken Time  BP    Temp    Pulse 61 06/20/20 1145  Resp 7 06/20/20 1145  SpO2 100 % 06/20/20 1145  Vitals shown include unvalidated device data.  Last Pain:  Vitals:   06/20/20 0910  TempSrc: Oral  PainSc: 0-No pain      Patients Stated Pain Goal: 4 (72/82/06 0156)  Complications: No complications documented.

## 2020-06-20 NOTE — Anesthesia Postprocedure Evaluation (Signed)
Anesthesia Post Note  Patient: [name]  Procedure(s) Performed: CYSTOSCOPY WITH BILATERAL  RETROGRADE PYELOGRAM, LEFT URETEROSCOPY AND STENT PLACEMENT (Bilateral ) HOLMIUM LASER APPLICATION (Left )     Patient location during evaluation: PACU Anesthesia Type: General Level of consciousness: awake and alert Pain management: pain level controlled Vital Signs Assessment: post-procedure vital signs reviewed and stable Respiratory status: spontaneous breathing, nonlabored ventilation, respiratory function stable and patient connected to nasal cannula oxygen Cardiovascular status: blood pressure returned to baseline and stable Postop Assessment: no apparent nausea or vomiting Anesthetic complications: no   No complications documented.  Last Vitals:  Vitals:   06/20/20 1200 06/20/20 1215  BP: (!) 133/111 135/81  Pulse: 63 (!) 59  Resp: (!) 24 14  Temp:  36.4 C  SpO2: 96% 98%    Last Pain:  Vitals:   06/20/20 1145  TempSrc:   PainSc: 0-No pain                 Barnet [place]

## 2020-06-20 NOTE — Discharge Instructions (Signed)
1 - You may have urinary urgency (bladder spasms) and bloody urine on / off with stent in place. This is normal.  2 - Remove tethered stent on Monday morning at home by pulling on string, then blue-white plastic tubing, and discarding. Office is open Monday if any problems arise.   3 - Call MD or go to ER for fever >102, severe pain / nausea / vomiting not relieved by medications, or acute change in medical status  Ureteral Stent Implantation, Care After This sheet gives you information about how to care for yourself after your procedure. Your health care provider may also give you more specific instructions. If you have problems or questions, contact your health care provider. What can I expect after the procedure? After the procedure, it is common to have:  Nausea.  Mild pain when you urinate. You may feel this pain in your lower back or lower abdomen. The pain should stop within a few minutes after you urinate. This may last for up to 1 week.  A small amount of blood in your urine for several days. Follow these instructions at home: Medicines  Take over-the-counter and prescription medicines only as told by your health care provider.  If you were prescribed an antibiotic medicine, take it as told by your health care provider. Do not stop taking the antibiotic even if you start to feel better.  Do not drive for 24 hours if you were given a sedative during your procedure.  Ask your health care provider if the medicine prescribed to you requires you to avoid driving or using heavy machinery. Activity  Rest as told by your health care provider.  Avoid sitting for a long time without moving. Get up to take short walks every 1-2 hours. This is important to improve blood flow and breathing. Ask for help if you feel weak or unsteady.  Return to your normal activities as told by your health care provider. Ask your health care provider what activities are safe for you. General  instructions   Watch for any blood in your urine. Call your health care provider if the amount of blood in your urine increases.  If you have a catheter: ? Follow instructions from your health care provider about taking care of your catheter and collection bag. ? Do not take baths, swim, or use a hot tub until your health care provider approves. Ask your health care provider if you may take showers. You may only be allowed to take sponge baths.  Drink enough fluid to keep your urine pale yellow.  Do not use any products that contain nicotine or tobacco, such as cigarettes, e-cigarettes, and chewing tobacco. These can delay healing after surgery. If you need help quitting, ask your health care provider.  Keep all follow-up visits as told by your health care provider. This is important. Contact a health care provider if:  You have pain that gets worse or does not get better with medicine, especially pain when you urinate.  You have difficulty urinating.  You feel nauseous or you vomit repeatedly during a period of more than 2 days after the procedure. Get help right away if:  Your urine is dark red or has blood clots in it.  You are leaking urine (have incontinence).  The end of the stent comes out of your urethra.  You cannot urinate.  You have sudden, sharp, or severe pain in your abdomen or lower back.  You have a fever.  You have swelling  or pain in your legs.  You have difficulty breathing. Summary  After the procedure, it is common to have mild pain when you urinate that goes away within a few minutes after you urinate. This may last for up to 1 week.  Watch for any blood in your urine. Call your health care provider if the amount of blood in your urine increases.  Take over-the-counter and prescription medicines only as told by your health care provider.  Drink enough fluid to keep your urine pale yellow. This information is not intended to replace advice given to  you by your health care provider. Make sure you discuss any questions you have with your health care provider. Document Revised: 07/11/2018 Document Reviewed: 07/12/2018 Elsevier Patient Education  [place] Instructions  Activity: Get plenty of rest for the remainder of the day. A responsible individual must stay with you for 24 hours following the procedure.  For the next 24 hours, DO NOT: -Drive a car -Paediatric nurse -Drink alcoholic beverages -Take any medication unless instructed by your physician -Make any legal decisions or sign important papers.  Meals: Start with liquid foods such as gelatin or soup. Progress to regular foods as tolerated. Avoid greasy, spicy, heavy foods. If nausea and/or vomiting occur, drink only clear liquids until the nausea and/or vomiting subsides. Call your physician if vomiting continues.  Special Instructions/Symptoms: Your throat may feel dry or sore from the anesthesia or the breathing tube placed in your throat during surgery. If this causes discomfort, gargle with warm salt water. The discomfort should disappear within 24 hours.

## 2020-06-20 NOTE — Anesthesia Procedure Notes (Signed)
Procedure Name: Intubation Date/Time: 06/20/2020 10:44 AM Performed by: Myna Bright, CRNA Pre-anesthesia Checklist: Patient identified, Emergency Drugs available, Suction available and Patient being monitored Patient Re-evaluated:Patient Re-evaluated prior to induction Oxygen Delivery Method: Circle system utilized Preoxygenation: Pre-oxygenation with 100% oxygen Induction Type: IV induction Ventilation: Mask ventilation without difficulty LMA: LMA inserted LMA Size: 4.0 Tube type: Oral Number of attempts: 1 Placement Confirmation: positive ETCO2 and breath sounds checked- equal and bilateral Tube secured with: Tape Dental Injury: Teeth and Oropharynx as per pre-operative assessment

## 2020-06-25 ENCOUNTER — Encounter (HOSPITAL_BASED_OUTPATIENT_CLINIC_OR_DEPARTMENT_OTHER): Payer: Self-pay | Admitting: Urology

## 2020-06-26 DIAGNOSIS — N202 Calculus of kidney with calculus of ureter: Secondary | ICD-10-CM | POA: Diagnosis not present

## 2020-07-07 DIAGNOSIS — N5201 Erectile dysfunction due to arterial insufficiency: Secondary | ICD-10-CM | POA: Diagnosis not present

## 2020-07-07 DIAGNOSIS — N281 Cyst of kidney, acquired: Secondary | ICD-10-CM | POA: Diagnosis not present

## 2020-07-07 DIAGNOSIS — N202 Calculus of kidney with calculus of ureter: Secondary | ICD-10-CM | POA: Diagnosis not present

## 2020-07-08 DIAGNOSIS — Z23 Encounter for immunization: Secondary | ICD-10-CM | POA: Diagnosis not present

## 2020-07-11 ENCOUNTER — Other Ambulatory Visit: Payer: Self-pay | Admitting: *Deleted

## 2020-07-11 DIAGNOSIS — Z87891 Personal history of nicotine dependence: Secondary | ICD-10-CM

## 2020-07-11 DIAGNOSIS — F17210 Nicotine dependence, cigarettes, uncomplicated: Secondary | ICD-10-CM

## 2020-07-23 DIAGNOSIS — N200 Calculus of kidney: Secondary | ICD-10-CM | POA: Diagnosis not present

## 2020-07-24 DIAGNOSIS — Z23 Encounter for immunization: Secondary | ICD-10-CM | POA: Diagnosis not present

## 2020-07-29 ENCOUNTER — Other Ambulatory Visit: Payer: Self-pay

## 2020-07-29 ENCOUNTER — Ambulatory Visit
Admission: RE | Admit: 2020-07-29 | Discharge: 2020-07-29 | Disposition: A | Discharge status: Home / Self Care | Payer: Medicare Other | Source: Ambulatory Visit | Attending: Acute Care | Admitting: Acute Care

## 2020-07-29 DIAGNOSIS — E041 Nontoxic single thyroid nodule: Secondary | ICD-10-CM | POA: Diagnosis not present

## 2020-07-29 DIAGNOSIS — N200 Calculus of kidney: Secondary | ICD-10-CM | POA: Diagnosis not present

## 2020-07-29 DIAGNOSIS — Z87891 Personal history of nicotine dependence: Secondary | ICD-10-CM

## 2020-07-29 DIAGNOSIS — I2510 Atherosclerotic heart disease of native coronary artery without angina pectoris: Secondary | ICD-10-CM | POA: Diagnosis not present

## 2020-07-29 DIAGNOSIS — F17210 Nicotine dependence, cigarettes, uncomplicated: Secondary | ICD-10-CM

## 2020-07-29 DIAGNOSIS — I700 Atherosclerosis of aorta: Secondary | ICD-10-CM | POA: Diagnosis not present

## 2020-07-31 DIAGNOSIS — D0421 Carcinoma in situ of skin of right ear and external auricular canal: Secondary | ICD-10-CM | POA: Diagnosis not present

## 2020-07-31 NOTE — Progress Notes (Signed)
Please call patient and let them  know their  low dose Ct was read as a* Lung RADS 2: nodules that are benign in appearance and behavior with a very low likelihood of becoming a clinically active cancer due to size or lack of growth. Recommendation per radiology is for a repeat LDCT in 12 months. .Please let them  know we will order and schedule their  annual screening scan for 10/202. Please let them  know there was notation of CAD on their  scan.  Please remind the patient  that this is a non-gated exam therefore degree or severity of disease  cannot be determined. Please have them  follow up with their PCP regarding potential risk factor modification, dietary therapy or pharmacologic therapy if clinically indicated. Pt.  is  currently on statin therapy. Please place order for annual  screening scan for  07/2021 and fax results to PCP. Thanks so much.

## 2020-08-04 ENCOUNTER — Other Ambulatory Visit: Payer: Self-pay | Admitting: *Deleted

## 2020-08-04 DIAGNOSIS — F17210 Nicotine dependence, cigarettes, uncomplicated: Secondary | ICD-10-CM

## 2020-08-04 DIAGNOSIS — Z87891 Personal history of nicotine dependence: Secondary | ICD-10-CM

## 2020-08-19 DIAGNOSIS — R34 Anuria and oliguria: Secondary | ICD-10-CM | POA: Diagnosis not present

## 2020-08-19 DIAGNOSIS — N5201 Erectile dysfunction due to arterial insufficiency: Secondary | ICD-10-CM | POA: Diagnosis not present

## 2020-08-19 DIAGNOSIS — N281 Cyst of kidney, acquired: Secondary | ICD-10-CM | POA: Diagnosis not present

## 2020-08-19 DIAGNOSIS — N202 Calculus of kidney with calculus of ureter: Secondary | ICD-10-CM | POA: Diagnosis not present

## 2020-11-19 ENCOUNTER — Other Ambulatory Visit: Payer: Self-pay | Admitting: Internal Medicine

## 2020-11-19 DIAGNOSIS — E119 Type 2 diabetes mellitus without complications: Secondary | ICD-10-CM

## 2020-12-23 ENCOUNTER — Other Ambulatory Visit: Payer: Self-pay | Admitting: Internal Medicine

## 2020-12-23 DIAGNOSIS — E785 Hyperlipidemia, unspecified: Secondary | ICD-10-CM

## 2021-01-28 ENCOUNTER — Other Ambulatory Visit: Payer: Self-pay

## 2021-01-28 ENCOUNTER — Ambulatory Visit: Payer: Medicare Other | Attending: Internal Medicine

## 2021-01-28 DIAGNOSIS — Z23 Encounter for immunization: Secondary | ICD-10-CM

## 2021-01-28 NOTE — Progress Notes (Signed)
   Covid-19 Vaccination Clinic  Name:  [name]    MRN: [id number] DOB: [birth year]  01/28/2021  Mr. Kidane was observed post Covid-19 immunization for 15 minutes without incident. He was provided with Vaccine Information Sheet and instruction to access the V-Safe system.   Mr. Strege was instructed to call 911 with any severe reactions post vaccine: Marland Kitchen Difficulty breathing  . Swelling of face and throat  . A fast heartbeat  . A bad rash all over body  . Dizziness and weakness   Immunizations Administered    Name Date Dose VIS Date Route   PFIZER Comrnaty(Gray TOP) Covid-19 Vaccine 01/28/2021  1:19 PM 0.3 mL 09/25/2020 Intramuscular   Manufacturer: [place]   Lot: [id number]   NDC: [id number]

## 2021-01-29 ENCOUNTER — Other Ambulatory Visit: Payer: Self-pay | Admitting: Internal Medicine

## 2021-01-29 DIAGNOSIS — A6000 Herpesviral infection of urogenital system, unspecified: Secondary | ICD-10-CM

## 2021-02-02 ENCOUNTER — Other Ambulatory Visit (HOSPITAL_BASED_OUTPATIENT_CLINIC_OR_DEPARTMENT_OTHER): Payer: Self-pay

## 2021-02-02 MED ORDER — PFIZER-BIONT COVID-19 VAC-TRIS 30 MCG/0.3ML IM SUSP
INTRAMUSCULAR | 0 refills | Status: DC
  Filled 2021-02-02: qty 0.3, 1d supply, fill #0

## 2021-02-08 ENCOUNTER — Encounter: Payer: Self-pay | Admitting: Internal Medicine

## 2021-02-11 DIAGNOSIS — M5416 Radiculopathy, lumbar region: Secondary | ICD-10-CM | POA: Diagnosis not present

## 2021-02-11 DIAGNOSIS — M545 Low back pain, unspecified: Secondary | ICD-10-CM | POA: Diagnosis not present

## 2021-02-25 DIAGNOSIS — M5416 Radiculopathy, lumbar region: Secondary | ICD-10-CM | POA: Diagnosis not present

## 2021-03-10 ENCOUNTER — Encounter: Payer: Self-pay | Admitting: Internal Medicine

## 2021-03-13 ENCOUNTER — Other Ambulatory Visit: Payer: Self-pay | Admitting: Internal Medicine

## 2021-03-13 DIAGNOSIS — E785 Hyperlipidemia, unspecified: Secondary | ICD-10-CM

## 2021-03-17 ENCOUNTER — Other Ambulatory Visit: Payer: Self-pay | Admitting: Internal Medicine

## 2021-03-17 DIAGNOSIS — E785 Hyperlipidemia, unspecified: Secondary | ICD-10-CM

## 2021-03-24 DIAGNOSIS — M5416 Radiculopathy, lumbar region: Secondary | ICD-10-CM | POA: Diagnosis not present

## 2021-03-25 ENCOUNTER — Other Ambulatory Visit: Payer: Self-pay | Admitting: Internal Medicine

## 2021-03-25 DIAGNOSIS — E119 Type 2 diabetes mellitus without complications: Secondary | ICD-10-CM

## 2021-03-25 DIAGNOSIS — A6000 Herpesviral infection of urogenital system, unspecified: Secondary | ICD-10-CM

## 2021-04-15 ENCOUNTER — Other Ambulatory Visit (HOSPITAL_BASED_OUTPATIENT_CLINIC_OR_DEPARTMENT_OTHER): Payer: Self-pay

## 2021-04-24 ENCOUNTER — Other Ambulatory Visit: Payer: Self-pay | Admitting: Internal Medicine

## 2021-04-24 DIAGNOSIS — E785 Hyperlipidemia, unspecified: Secondary | ICD-10-CM

## 2021-04-24 DIAGNOSIS — E119 Type 2 diabetes mellitus without complications: Secondary | ICD-10-CM

## 2021-05-18 ENCOUNTER — Telehealth: Payer: Self-pay

## 2021-05-18 NOTE — Telephone Encounter (Signed)
Hi John,  Just wanted you to take a look at this pt's ETT and CTA results from last year to make sure he is ok for [place], thanks, Westley Hummer

## 2021-05-25 ENCOUNTER — Other Ambulatory Visit: Payer: Self-pay | Admitting: Internal Medicine

## 2021-05-25 DIAGNOSIS — E119 Type 2 diabetes mellitus without complications: Secondary | ICD-10-CM

## 2021-05-25 DIAGNOSIS — E785 Hyperlipidemia, unspecified: Secondary | ICD-10-CM

## 2021-05-27 ENCOUNTER — Other Ambulatory Visit: Payer: Self-pay

## 2021-05-27 ENCOUNTER — Ambulatory Visit (AMBULATORY_SURGERY_CENTER): Payer: Self-pay

## 2021-05-27 VITALS — Ht 71.0 in | Wt 166.0 lb

## 2021-05-27 DIAGNOSIS — Z86010 Personal history of colonic polyps: Secondary | ICD-10-CM

## 2021-05-27 NOTE — Progress Notes (Signed)
No egg or soy allergy known to patient  No issues with past sedation with any surgeries or procedures Patient denies ever being told they had issues or difficulty with intubation  No FH of Malignant Hyperthermia No diet pills per patient No home 02 use per patient  No blood thinners per patient  Pt denies issues with constipation at this time;  No A fib or A flutter  EMMI video via MyChart  COVID 19 guidelines implemented in [place] today with Pt and RN  Pt is fully vaccinated for Covid x 2 + booster; NO PA's for preps discussed with pt in PV today  Discussed with pt there will be an out-of-pocket cost for prep and that varies from $0 to 70 dollars  Due to the COVID-19 pandemic we are asking patients to follow certain guidelines.  Pt aware of COVID protocols and [facility] guidelines

## 2021-06-10 ENCOUNTER — Encounter: Payer: Self-pay | Admitting: Internal Medicine

## 2021-06-10 ENCOUNTER — Other Ambulatory Visit: Payer: Self-pay

## 2021-06-10 ENCOUNTER — Ambulatory Visit (AMBULATORY_SURGERY_CENTER): Payer: Medicare Other | Admitting: Internal Medicine

## 2021-06-10 VITALS — BP 125/56 | HR 55 | Temp 97.7°F | Resp 13 | Ht 71.0 in | Wt 166.0 lb

## 2021-06-10 DIAGNOSIS — D123 Benign neoplasm of transverse colon: Secondary | ICD-10-CM

## 2021-06-10 DIAGNOSIS — Z86010 Personal history of colonic polyps: Secondary | ICD-10-CM | POA: Diagnosis not present

## 2021-06-10 MED ORDER — SODIUM CHLORIDE 0.9 % IV SOLN: 500.0000 mL | Freq: Once | INTRAVENOUS | Status: DC

## 2021-06-10 NOTE — Patient Instructions (Addendum)
I found and removed two very tiny polyps. I saw your diverticulosis again.  I am not sure if you should repeat a routine colonoscopy.  Will wait for pathology and let you know my recommendations then.  I appreciate the opportunity to care for you. Gatha Mayer, MD, [facility]  Read all of the handouts given to you  by your recovery room nurse.  YOU HAD AN ENDOSCOPIC PROCEDURE TODAY AT [facility]:   Refer to the procedure report that was given to you for any specific questions about what was found during the examination.  If the procedure report does not answer your questions, please call your gastroenterologist to clarify.  If you requested that your care partner not be given the details of your procedure findings, then the procedure report has been included in a sealed envelope for you to review at your convenience later.  YOU SHOULD EXPECT: Some feelings of bloating in the abdomen. Passage of more gas than usual.  Walking can help get rid of the air that was put into your GI tract during the procedure and reduce the bloating. If you had a lower endoscopy (such as a colonoscopy or flexible sigmoidoscopy) you may notice spotting of blood in your stool or on the toilet paper. If you underwent a bowel prep for your procedure, you may not have a normal bowel movement for a few days.  Please Note:  You might notice some irritation and congestion in your nose or some drainage.  This is from the oxygen used during your procedure.  There is no need for concern and it should clear up in a day or so.  SYMPTOMS TO REPORT IMMEDIATELY:  Following lower endoscopy (colonoscopy or flexible sigmoidoscopy):  Excessive amounts of blood in the stool  Significant tenderness or worsening of abdominal pains  Swelling of the abdomen that is new, acute  Fever of 100F or higher   For urgent or emergent issues, a gastroenterologist can be reached at any hour by calling [phone]. Do not use  MyChart messaging for urgent concerns.    DIET:  We do recommend a small meal at first, but then you may proceed to your regular diet.  Drink plenty of fluids but you should avoid alcoholic beverages for 24 hours.  ACTIVITY:  You should plan to take it easy for the rest of today and you should NOT DRIVE or use heavy machinery until tomorrow (because of the sedation medicines used during the test).    FOLLOW UP: Our staff will call the number listed on your records 48-72 hours following your procedure to check on you and address any questions or concerns that you may have regarding the information given to you following your procedure. If we do not reach you, we will leave a message.  We will attempt to reach you two times.  During this call, we will ask if you have developed any symptoms of COVID 19. If you develop any symptoms (ie: fever, flu-like symptoms, shortness of breath, cough etc.) before then, please call [phone].  If you test positive for Covid 19 in the 2 weeks post procedure, please call and report this information to [place].    If any biopsies were taken you will be contacted by phone or by letter within the next 1-3 weeks.  Please call us at [phone] if you have not heard about the biopsies in 3 weeks.    SIGNATURES/CONFIDENTIALITY: You and/or your care partner have signed paperwork  which will be entered into your electronic medical record.  These signatures attest to the fact that that the information above on your After Visit Summary has been reviewed and is understood.  Full responsibility of the confidentiality of this discharge information lies with you and/or your care-partner.

## 2021-06-10 NOTE — Progress Notes (Signed)
C.W. vital signs. 

## 2021-06-10 NOTE — Op Note (Signed)
[name] Patient Name: [name] Procedure Date: 06/10/2021 8:47 AM MRN: [id number] Endoscopist: Gatha Mayer , MD Age: 80 Referring MD:  Date of Birth: May 15, 1941 Gender: Male Account #: [id number] Procedure:                Colonoscopy Indications:              Surveillance: History of numerous (> 10) adenomas                            on last colonoscopy (< 3 yrs), Last colonoscopy:                            2020 Medicines:                Propofol per Anesthesia, Monitored Anesthesia Care Procedure:                Pre-Anesthesia Assessment:                           - Prior to the procedure, a History and Physical                            was performed, and patient medications and                            allergies were reviewed. The patient's tolerance of                            previous anesthesia was also reviewed. The risks                            and benefits of the procedure and the sedation                            options and risks were discussed with the patient.                            All questions were answered, and informed consent                            was obtained. Prior Anticoagulants: The patient has                            taken no previous anticoagulant or antiplatelet                            agents. ASA Grade Assessment: II - A patient with                            mild systemic disease. After reviewing the risks                            and benefits, the patient was deemed in  satisfactory condition to undergo the procedure.                           After obtaining informed consent, the colonoscope                            was passed under direct vision. Throughout the                            procedure, the patient's blood pressure, pulse, and                            oxygen saturations were monitored continuously. The                            CF [id number] was  introduced through the anus                            and advanced to the the cecum, identified by                            appendiceal orifice and ileocecal valve. The                            colonoscopy was performed without difficulty. The                            patient tolerated the procedure well. The quality                            of the bowel preparation was good. The bowel                            preparation used was Miralax via split dose                            instruction. Scope In: 8:51:29 AM Scope Out: 9:06:19 AM Scope Withdrawal Time: 0 hours 12 minutes 23 seconds  Total Procedure Duration: 0 hours 14 minutes 50 seconds  Findings:                 The perianal and digital rectal examinations were                            normal. Pertinent negatives include normal prostate                            (size, shape, and consistency).                           Two sessile polyps were found in the transverse                            colon. The polyps were 1 mm in size. These polyps  were removed with a cold biopsy forceps. Resection                            and retrieval were complete. Verification of                            patient identification for the specimen was done.                            Estimated blood loss was minimal.                           Multiple diverticula were found in the sigmoid                            colon and descending colon.                           The exam was otherwise without abnormality on                            direct and retroflexion views. Complications:            No immediate complications. Estimated Blood Loss:     Estimated blood loss was minimal. Impression:               - Two 1 mm polyps in the transverse colon, removed                            with a cold biopsy forceps. Resected and retrieved.                           - Diverticulosis in the sigmoid colon and in the                             descending colon.                           - The examination was otherwise normal on direct                            and retroflexion views.                           - Personal history of colonic polyps.                           Adenomas in past, mother w/ probable colon cancer                           2004 - none                           04/2009 Polyps, multiple (6, max size 8 mm) ADENOMAS  03/2013 - 56m adenoma (1) -                           07/02/2019 11 diminutive polyps removed - adenomas Recommendation:           - Patient has a contact number available for                            emergencies. The signs and symptoms of potential                            delayed complications were discussed with the                            patient. Return to normal activities tomorrow.                            Written discharge instructions were provided to the                            patient.                           - Resume previous diet.                           - Continue present medications.                           - Await pathology results.                           - No recommendation at this time regarding repeat                            colonoscopy due to age. CGatha Mayer MD 06/10/2021 9:14:02 AM This report has been signed electronically.

## 2021-06-10 NOTE — Progress Notes (Signed)
[facility] History and Physical   Primary Care Physician:  Isaac Werner, Paul Halsted, Paul   Reason for Procedure:   Hx colon polyps  Plan:    colonoscopy     HPI: [name] is a 80 y.o. male here for a surveillance colonoscopy   Past Medical History:  Diagnosis Date   Arthritis    generalized   Cataract    BILATERAL-REMOVED   Cigarette smoker    Colon polyp    COPD (chronic obstructive pulmonary disease) ([employer])    Pt reports does not have history of COPD   Coronary artery disease    nonobstructive   Diabetes mellitus without complication ([place])    on meds/diet controlled   Diverticulosis of colon    Gastritis    Hemorrhoids    Hiatal hernia    late 1990's   History of colonic polyps - ? attenuated polyposis 07/27/2007   Adenomas in past, mother w/ probable colon cancer 2004 - none 04/2009 Polyps, multiple (6, max size 8 mm) ADENOMAS 03/2013 - 6m adenoma (1) - 07/02/2019 11 diminutive polyps removed - adenomas CGatha Mayer [facility]     History of kidney stones    History of renal calculi    Hyperlipidemia    on meds   IBS (irritable bowel syndrome)    Lumbar back pain    Pre-diabetes     Past Surgical History:  Procedure Laterality Date   ANTERIOR LAT LUMBAR FUSION  08/14/2012   Procedure: ANTERIOR LATERAL LUMBAR FUSION 1 LEVEL;  Surgeon: HCharlie Pitter [facility];  [facility];  Service: Neurosurgery;  Laterality: Left;  Left lumbar three-four extreme lumbar interbody fusion with percutaneous pedicle screws   [place]     [place]   COLONOSCOPY  2021   with polyp removed x 3 or 4   COLONOSCOPY W/ BIOPSIES AND POLYPECTOMY  05/06/2009   adenomous polyp, diverticulosis, external hemorrhoids   CYSTOSCOPY  11/03   TURP and stone removal - Dr EAmalia Hailey  CYSTOSCOPY WITH RETROGRADE PYELOGRAM, URETEROSCOPY AND STENT PLACEMENT Bilateral 06/20/2020   Procedure: CYSTOSCOPY WITH  BILATERAL  RETROGRADE PYELOGRAM, LEFT URETEROSCOPY AND STENT PLACEMENT;  Surgeon: MAlexis Frock Paul;  [facility]  Service: Urology;  Laterality: Bilateral;  75 MINS   dental implants  2007   EYE SURGERY Bilateral 2019   cataracts both eyes   HOLMIUM LASER APPLICATION Left [id number]  Procedure: HOLMIUM LASER APPLICATION;  Surgeon: MAlexis Frock Paul;  [facility]  Service: Urology;  Laterality: Left;   LITHOTRIPSY     x 3   LUMBAR MICRODISCECTOMY  12/07   L3-4 by Dr PAnnette Stable  LUMBAR PERCUTANEOUS PEDICLE SCREW 1 LEVEL  08/14/2012   Procedure: LUMBAR PERCUTANEOUS PEDICLE SCREW 1 LEVEL;  Surgeon: HCharlie Pitter Paul;  [place] [facility];  Service: Neurosurgery;  Laterality: Left;  Left lumbar three-four extreme lumbar interbody fusion with percutaneous pedicle screws    ROTATOR CUFF REPAIR     left shoulder 2014, right shoulder 2018   UPPER GASTROINTESTINAL ENDOSCOPY  09/07/2010   erosive gastritis, GERD   UVULOPALATOPHARYNGOPLASTY  2001   Dr SWilburn Cornelia   Prior to Admission medications   Medication Sig Start Date End Date Taking? Authorizing Provider  acyclovir (ZOVIRAX) 400 MG tablet TAKE 1 TABLET(400 MG) BY MOUTH TWICE DAILY Patient taking differently:  Take 400 mg by mouth daily at 6 (six) AM. 03/25/21  Yes Isaac Werner, Paul Halsted, Paul  aspirin 81 MG tablet Take 81 mg by mouth daily.     Yes Provider, Historical, Paul  indapamide (LOZOL) 1.25 MG tablet Take 1.25 mg by mouth daily.   Yes Provider, Historical, Paul  metFORMIN (GLUCOPHAGE) 500 MG tablet TAKE 1 TABLET BY MOUTH DAILY WITH A MEAL 05/25/21  Yes Isaac Werner, Paul Halsted, Paul  Multiple Vitamins-Minerals (MULTIVITAMIN ADULT, MINERALS,) TABS Take 1 tablet by mouth 2 (two) times daily.   Yes Provider, Historical, Paul  Omega 3-5-6-7-9 Fatty Acids (COMPLETE OMEGA) CAPS Take 1 tablet by mouth daily at 6 (six) AM. Ultimate Omega   Yes Provider, Historical, Paul  simvastatin (ZOCOR) 20 MG  tablet TAKE 1 TABLET(20 MG) BY MOUTH AT BEDTIME 05/25/21  Yes Isaac Werner, Paul Halsted, Paul    Current Outpatient Medications  Medication Sig Dispense Refill   acyclovir (ZOVIRAX) 400 MG tablet TAKE 1 TABLET(400 MG) BY MOUTH TWICE DAILY (Patient taking differently: Take 400 mg by mouth daily at 6 (six) AM.) 60 tablet 0   aspirin 81 MG tablet Take 81 mg by mouth daily.       indapamide (LOZOL) 1.25 MG tablet Take 1.25 mg by mouth daily.     metFORMIN (GLUCOPHAGE) 500 MG tablet TAKE 1 TABLET BY MOUTH DAILY WITH A MEAL 30 tablet 0   Multiple Vitamins-Minerals (MULTIVITAMIN ADULT, MINERALS,) TABS Take 1 tablet by mouth 2 (two) times daily.     Omega 3-5-6-7-9 Fatty Acids (COMPLETE OMEGA) CAPS Take 1 tablet by mouth daily at 6 (six) AM. Ultimate Omega     simvastatin (ZOCOR) 20 MG tablet TAKE 1 TABLET(20 MG) BY MOUTH AT BEDTIME 30 tablet 0   Current Facility-Administered Medications  Medication Dose Route Frequency Provider Last Rate Last Admin   0.9 %  sodium chloride infusion  500 mL Intravenous Once Gatha Mayer, Paul        Allergies as of 06/10/2021   (No Known Allergies)    Family History  Problem Relation Age of Onset   Liver cancer Mother    Colon polyps Mother 91   Colon cancer Mother 61       mets to other organs   Diabetes Father    Kidney failure Father    Prostate cancer Brother    Esophageal cancer Neg Hx    Rectal cancer Neg Hx    Stomach cancer Neg Hx     Social History   Socioeconomic History   Marital status: Married    Spouse name: Not on file   Number of children: 2   Years of education: Not on file   Highest education level: Not on file  Occupational History   Occupation: retired from [employer]  Tobacco Use   Smoking status: Some Days    Packs/day: 0.80    Years: 51.00    Pack years: 40.80    Types: Cigarettes   Smokeless tobacco: Never  Vaping Use   Vaping Use: Never used  Substance and Sexual Activity   Alcohol use: Yes    Alcohol/week: 5.0  standard drinks    Types: 5 Standard drinks or equivalent per week   Drug use: No   Sexual activity: Not on file  Other Topics Concern   Not on file  Social History Narrative   Lives with wife and son who has epilepsy.  Retired [employer].     Social Determinants of Health  Financial Resource Strain: Not on file  Food Insecurity: Not on file  Transportation Needs: Not on file  Physical Activity: Not on file  Stress: Not on file  Social Connections: Not on file  Intimate Partner Violence: Not on file    Review of Systems:  All other review of systems negative except as mentioned in the HPI.  Physical Exam: Vital signs BP 124/73   Pulse 61   Temp 97.7 F (36.5 C)   Resp 10   Ht '5\' 11"'$  (1.803 m)   Wt 166 lb (75.3 kg)   SpO2 97%   BMI 23.15 kg/m   General:   Alert,  Well-developed, well-nourished, pleasant and cooperative in NAD Lungs:  Clear throughout to auscultation.   Heart:  Regular rate and rhythm; no murmurs, clicks, rubs,  or gallops. Abdomen:  Soft, nontender and nondistended. Normal bowel sounds.   Neuro/Psych:  Alert and cooperative. Normal mood and affect. A and O x 3   '@Zaiyden Strozier'$  Simonne Maffucci, Paul, [facility] [phone] (pager) 06/10/2021 8:49 AM@

## 2021-06-10 NOTE — Progress Notes (Signed)
Called to room to assist during endoscopic procedure.  Patient ID and intended procedure confirmed with present staff. Received instructions for my participation in the procedure from the performing physician.  

## 2021-06-10 NOTE — Progress Notes (Signed)
Pt's states no medical or surgical changes since previsit or office visit. 

## 2021-06-10 NOTE — Progress Notes (Signed)
To PACU, VSS. Report to [email or web address].tb 

## 2021-06-12 ENCOUNTER — Telehealth: Payer: Self-pay

## 2021-06-12 NOTE — Telephone Encounter (Signed)
  Follow up Call-  Call back number 06/10/2021 07/02/2019  Post procedure Call Back phone  # [phone]  Permission to leave phone message Yes Yes  Some recent data might be hidden     Patient questions:  Do you have a fever, pain , or abdominal swelling? No. Pain Score  0 *  Have you tolerated food without any problems? Yes.    Have you been able to return to your normal activities? Yes.    Do you have any questions about your discharge instructions: Diet   No. Medications  No. Follow up visit  No.  Do you have questions or concerns about your Care? No.  Actions: * If pain score is 4 or above: No action needed, pain <4.

## 2021-06-18 ENCOUNTER — Encounter: Payer: Self-pay | Admitting: Internal Medicine

## 2021-06-23 ENCOUNTER — Other Ambulatory Visit: Payer: Self-pay | Admitting: Internal Medicine

## 2021-06-23 DIAGNOSIS — E785 Hyperlipidemia, unspecified: Secondary | ICD-10-CM

## 2021-06-23 DIAGNOSIS — E119 Type 2 diabetes mellitus without complications: Secondary | ICD-10-CM

## 2021-06-24 DIAGNOSIS — D225 Melanocytic nevi of trunk: Secondary | ICD-10-CM | POA: Diagnosis not present

## 2021-06-24 DIAGNOSIS — L821 Other seborrheic keratosis: Secondary | ICD-10-CM | POA: Diagnosis not present

## 2021-06-24 DIAGNOSIS — L578 Other skin changes due to chronic exposure to nonionizing radiation: Secondary | ICD-10-CM | POA: Diagnosis not present

## 2021-06-24 DIAGNOSIS — L570 Actinic keratosis: Secondary | ICD-10-CM | POA: Diagnosis not present

## 2021-06-25 DIAGNOSIS — E119 Type 2 diabetes mellitus without complications: Secondary | ICD-10-CM | POA: Diagnosis not present

## 2021-06-25 DIAGNOSIS — H5213 Myopia, bilateral: Secondary | ICD-10-CM | POA: Diagnosis not present

## 2021-06-25 LAB — HM DIABETES EYE EXAM

## 2021-07-03 ENCOUNTER — Encounter: Payer: Self-pay | Admitting: Internal Medicine

## 2021-07-16 ENCOUNTER — Other Ambulatory Visit (HOSPITAL_BASED_OUTPATIENT_CLINIC_OR_DEPARTMENT_OTHER): Payer: Self-pay

## 2021-07-16 ENCOUNTER — Ambulatory Visit: Payer: Medicare Other | Attending: Internal Medicine

## 2021-07-16 DIAGNOSIS — Z23 Encounter for immunization: Secondary | ICD-10-CM

## 2021-07-16 MED ORDER — PFIZER COVID-19 VAC BIVALENT 30 MCG/0.3ML IM SUSP
INTRAMUSCULAR | 0 refills | Status: DC
  Filled 2021-07-16: qty 0.3, 1d supply, fill #0

## 2021-07-16 NOTE — Progress Notes (Signed)
   Covid-19 Vaccination Clinic  Name:  [name]    MRN: [id number] DOB: Oct 19, 1940  07/16/2021  [name] was observed post Covid-19 immunization for 15 minutes without incident. He was provided with Vaccine Information Sheet and instruction to access the V-Safe system.   [name] was instructed to call 911 with any severe reactions post vaccine: Difficulty breathing  Swelling of face and throat  A fast heartbeat  A bad rash all over body  Dizziness and weakness

## 2021-07-23 ENCOUNTER — Other Ambulatory Visit: Payer: Self-pay | Admitting: Internal Medicine

## 2021-07-23 DIAGNOSIS — E119 Type 2 diabetes mellitus without complications: Secondary | ICD-10-CM

## 2021-07-27 ENCOUNTER — Other Ambulatory Visit: Payer: Self-pay | Admitting: Internal Medicine

## 2021-07-27 DIAGNOSIS — E785 Hyperlipidemia, unspecified: Secondary | ICD-10-CM

## 2021-07-29 ENCOUNTER — Other Ambulatory Visit: Payer: Self-pay | Admitting: Internal Medicine

## 2021-07-29 ENCOUNTER — Telehealth: Payer: Self-pay

## 2021-07-29 DIAGNOSIS — E785 Hyperlipidemia, unspecified: Secondary | ICD-10-CM

## 2021-07-29 MED ORDER — SIMVASTATIN 20 MG PO TABS: ORAL_TABLET | ORAL | 0 refills | Status: DC

## 2021-07-29 NOTE — Addendum Note (Signed)
Addended by: Nilda Riggs on: 07/29/2021 11:32 AM   Modules accepted: Orders

## 2021-07-29 NOTE — Telephone Encounter (Signed)
Patient called requesting Rx refill  simvastatin (ZOCOR) 20 MG tablet Pt stated he is going out of town and needs it sent to the pharmacy soon.

## 2021-07-29 NOTE — Telephone Encounter (Signed)
Rx sent and CPE scheduled for 10/28

## 2021-08-13 ENCOUNTER — Other Ambulatory Visit: Payer: Self-pay

## 2021-08-14 ENCOUNTER — Ambulatory Visit (INDEPENDENT_AMBULATORY_CARE_PROVIDER_SITE_OTHER): Payer: Medicare Other | Admitting: Internal Medicine

## 2021-08-14 ENCOUNTER — Encounter: Payer: Self-pay | Admitting: Internal Medicine

## 2021-08-14 VITALS — BP 130/80 | HR 68 | Temp 98.3°F | Ht 71.0 in | Wt 171.5 lb

## 2021-08-14 DIAGNOSIS — Z0000 Encounter for general adult medical examination without abnormal findings: Secondary | ICD-10-CM | POA: Diagnosis not present

## 2021-08-14 DIAGNOSIS — A6000 Herpesviral infection of urogenital system, unspecified: Secondary | ICD-10-CM

## 2021-08-14 DIAGNOSIS — I2510 Atherosclerotic heart disease of native coronary artery without angina pectoris: Secondary | ICD-10-CM | POA: Diagnosis not present

## 2021-08-14 DIAGNOSIS — B351 Tinea unguium: Secondary | ICD-10-CM | POA: Diagnosis not present

## 2021-08-14 DIAGNOSIS — E785 Hyperlipidemia, unspecified: Secondary | ICD-10-CM | POA: Diagnosis not present

## 2021-08-14 DIAGNOSIS — I10 Essential (primary) hypertension: Secondary | ICD-10-CM

## 2021-08-14 DIAGNOSIS — Z23 Encounter for immunization: Secondary | ICD-10-CM

## 2021-08-14 DIAGNOSIS — H9190 Unspecified hearing loss, unspecified ear: Secondary | ICD-10-CM | POA: Diagnosis not present

## 2021-08-14 DIAGNOSIS — E119 Type 2 diabetes mellitus without complications: Secondary | ICD-10-CM

## 2021-08-14 LAB — CBC WITH DIFFERENTIAL/PLATELET
Basophils Absolute: 0 10*3/uL (ref 0.0–0.1)
Basophils Relative: 0.7 % (ref 0.0–3.0)
Eosinophils Absolute: 0.2 10*3/uL (ref 0.0–0.7)
Eosinophils Relative: 3.9 % (ref 0.0–5.0)
HCT: 45 % (ref 39.0–52.0)
Hemoglobin: 15 g/dL (ref 13.0–17.0)
Lymphocytes Relative: 33.3 % (ref 12.0–46.0)
Lymphs Abs: 1.7 10*3/uL (ref 0.7–4.0)
MCHC: 33.3 g/dL (ref 30.0–36.0)
MCV: 94.3 fl (ref 78.0–100.0)
Monocytes Absolute: 0.5 10*3/uL (ref 0.1–1.0)
Monocytes Relative: 10.1 % (ref 3.0–12.0)
Neutro Abs: 2.6 10*3/uL (ref 1.4–7.7)
Neutrophils Relative %: 52 % (ref 43.0–77.0)
Platelets: 244 10*3/uL (ref 150.0–400.0)
RBC: 4.77 Mil/uL (ref 4.22–5.81)
RDW: 13.1 % (ref 11.5–15.5)
WBC: 5 10*3/uL (ref 4.0–10.5)

## 2021-08-14 LAB — COMPREHENSIVE METABOLIC PANEL
ALT: 16 U/L (ref 0–53)
AST: 17 U/L (ref 0–37)
Albumin: 4.6 g/dL (ref 3.5–5.2)
Alkaline Phosphatase: 79 U/L (ref 39–117)
BUN: 17 mg/dL (ref 6–23)
CO2: 32 mEq/L (ref 19–32)
Calcium: 10 mg/dL (ref 8.4–10.5)
Chloride: 100 mEq/L (ref 96–112)
Creatinine, Ser: 1.08 mg/dL (ref 0.40–1.50)
GFR: 66.01 mL/min (ref >60.00)
Glucose, Bld: 110 mg/dL — ABNORMAL HIGH (ref 70–99)
Potassium: 3.9 mEq/L (ref 3.5–5.1)
Sodium: 138 mEq/L (ref 135–145)
Total Bilirubin: 0.6 mg/dL (ref 0.2–1.2)
Total Protein: 7.1 g/dL (ref 6.0–8.3)

## 2021-08-14 LAB — LIPID PANEL
Cholesterol: 137 mg/dL (ref 0–200)
HDL: 48 mg/dL (ref >39.00)
LDL Cholesterol: 68 mg/dL (ref 0–99)
NonHDL: 88.92
Total CHOL/HDL Ratio: 3
Triglycerides: 103 mg/dL (ref 0.0–149.0)
VLDL: 20.6 mg/dL (ref 0.0–40.0)

## 2021-08-14 LAB — HEMOGLOBIN A1C: Hgb A1c MFr Bld: 7.3 % — ABNORMAL HIGH (ref 4.6–6.5)

## 2021-08-14 MED ORDER — ACYCLOVIR 400 MG PO TABS: 400.0000 mg | ORAL_TABLET | Freq: Every day | ORAL | 1 refills | Status: DC

## 2021-08-14 MED ORDER — METFORMIN HCL 500 MG PO TABS: 500.0000 mg | ORAL_TABLET | Freq: Every day | ORAL | 1 refills | Status: DC

## 2021-08-14 MED ORDER — SIMVASTATIN 20 MG PO TABS: ORAL_TABLET | ORAL | 1 refills | Status: DC

## 2021-08-14 NOTE — Progress Notes (Signed)
Established Patient Office Visit     This visit occurred during the SARS-CoV-2 public health emergency.  Safety protocols were in place, including screening questions prior to the visit, additional usage of staff PPE, and extensive cleaning of exam room while observing appropriate contact time as indicated for disinfecting solutions.    CC/Reason for Visit: Subsequent Medicare wellness visit and discuss acute concerns  HPI: [name] is a 80 y.o. male who is coming in today for the above mentioned reasons. Past Medical History is significant for: Hypertension, hyperlipidemia and type 2 diabetes.  I have not seen him since April 2021.  He has routine eye and dental care, he has noticed decreased hearing and is requesting a hearing exam.  He is requesting a flu vaccine, he is also due for shingles vaccination.  He had a colonoscopy in August of this year.  He would like me to see his right thumbnail which is distorted, thick and yellow.  It does not affect any of his other fingernails or toenails.   Past Medical/Surgical History: Past Medical History:  Diagnosis Date   Arthritis    generalized   Cataract    BILATERAL-REMOVED   Cigarette smoker    Colon polyp    COPD (chronic obstructive pulmonary disease) ([facility])    Pt reports does not have history of COPD   Coronary artery disease    nonobstructive   Diabetes mellitus without complication ([facility])    on meds/diet controlled   Diverticulosis of colon    Gastritis    Hemorrhoids    Hiatal hernia    late 1990's   History of colonic polyps - ? attenuated polyposis 07/27/2007   Adenomas in past, mother w/ probable colon cancer 2004 - none 04/2009 Polyps, multiple (6, max size 8 mm) ADENOMAS 03/2013 - 67mm adenoma (1) - 07/02/2019 11 diminutive polyps removed - adenomas Gatha Mayer, MD, [facility]      History of kidney stones    History of renal calculi    Hyperlipidemia    on meds   IBS (irritable bowel syndrome)    Lumbar back  pain    Pre-diabetes     Past Surgical History:  Procedure Laterality Date   ANTERIOR LAT LUMBAR FUSION  08/14/2012   Procedure: ANTERIOR LATERAL LUMBAR FUSION 1 LEVEL;  Surgeon: Charlie Pitter, MD;  Location: [place] [facility];  Service: Neurosurgery;  Laterality: Left;  Left lumbar three-four extreme lumbar interbody fusion with percutaneous pedicle screws   BACK SURGERY     x 4   [place] 1995 dr deaton   COLONOSCOPY  2021   with polyp removed x 3 or 4   COLONOSCOPY W/ BIOPSIES AND POLYPECTOMY  05/06/2009   adenomous polyp, diverticulosis, external hemorrhoids   CYSTOSCOPY  11/03   TURP and stone removal - Dr Amalia Hailey   CYSTOSCOPY WITH RETROGRADE PYELOGRAM, URETEROSCOPY AND STENT PLACEMENT Bilateral 06/20/2020   Procedure: CYSTOSCOPY WITH BILATERAL  RETROGRADE PYELOGRAM, LEFT URETEROSCOPY AND STENT PLACEMENT;  Surgeon: Alexis Frock, MD;  Location: [facility];  Service: Urology;  Laterality: Bilateral;  75 MINS   dental implants  2007   EYE SURGERY Bilateral 2019   cataracts both eyes   HOLMIUM LASER APPLICATION Left 11/23/35   Procedure: HOLMIUM LASER APPLICATION;  Surgeon: Alexis Frock, MD;  Location: [facility];  Service: Urology;  Laterality: Left;   LITHOTRIPSY     x  3   LUMBAR MICRODISCECTOMY  12/07   L3-4 by Dr Annette Stable   LUMBAR PERCUTANEOUS PEDICLE SCREW 1 LEVEL  08/14/2012   Procedure: LUMBAR PERCUTANEOUS PEDICLE SCREW 1 LEVEL;  Surgeon: Charlie Pitter, MD;  Location: [place] [facility];  Service: Neurosurgery;  Laterality: Left;  Left lumbar three-four extreme lumbar interbody fusion with percutaneous pedicle screws    ROTATOR CUFF REPAIR     left shoulder 2014, right shoulder 2018   UPPER GASTROINTESTINAL ENDOSCOPY  09/07/2010   erosive gastritis, GERD   UVULOPALATOPHARYNGOPLASTY  2001   Dr Wilburn Cornelia    Social History:  reports that he has been smoking cigarettes. He has a 40.80 pack-year smoking history.  He has never used smokeless tobacco. He reports current alcohol use of about 5.0 standard drinks per week. He reports that he does not use drugs.  Allergies: No Known Allergies  Family History:  Family History  Problem Relation Age of Onset   Liver cancer Mother    Colon polyps Mother 64   Colon cancer Mother 51       mets to other organs   Diabetes Father    Kidney failure Father    Prostate cancer Brother    Esophageal cancer Neg Hx    Rectal cancer Neg Hx    Stomach cancer Neg Hx      Current Outpatient Medications:    COVID-19 mRNA bivalent vaccine, Pfizer, (PFIZER COVID-19 VAC BIVALENT) injection, Inject into the muscle., Disp: 0.3 mL, Rfl: 0   indapamide (LOZOL) 1.25 MG tablet, Take 1.25 mg by mouth daily., Disp: , Rfl:    Multiple Vitamins-Minerals (MULTIVITAMIN ADULT, MINERALS,) TABS, Take 1 tablet by mouth 2 (two) times daily., Disp: , Rfl:    Omega 3-5-6-7-9 Fatty Acids (COMPLETE OMEGA) CAPS, Take 1 tablet by mouth daily at 6 (six) AM. Ultimate Omega, Disp: , Rfl:    acyclovir (ZOVIRAX) 400 MG tablet, Take 1 tablet (400 mg total) by mouth daily at 6 (six) AM., Disp: 90 tablet, Rfl: 1   metFORMIN (GLUCOPHAGE) 500 MG tablet, Take 1 tablet (500 mg total) by mouth daily., Disp: 90 tablet, Rfl: 1   simvastatin (ZOCOR) 20 MG tablet, TAKE 1 TABLET(20 MG) BY MOUTH AT BEDTIME, Disp: 90 tablet, Rfl: 1  Review of Systems:  Constitutional: Denies fever, chills, diaphoresis, appetite change and fatigue.  HEENT: Denies photophobia, eye pain, redness, hearing loss, ear pain, congestion, sore throat, rhinorrhea, sneezing, mouth sores, trouble swallowing, neck pain, neck stiffness and tinnitus.   Respiratory: Denies SOB, DOE, cough, chest tightness,  and wheezing.   Cardiovascular: Denies chest pain, palpitations and leg swelling.  Gastrointestinal: Denies nausea, vomiting, abdominal pain, diarrhea, constipation, blood in stool and abdominal distention.  Genitourinary: Denies dysuria,  urgency, frequency, hematuria, flank pain and difficulty urinating.  Endocrine: Denies: hot or cold intolerance, sweats, changes in hair or nails, polyuria, polydipsia. Musculoskeletal: Denies myalgias, back pain, joint swelling, arthralgias and gait problem.  Skin: Denies pallor, rash and wound.  Neurological: Denies dizziness, seizures, syncope, weakness, light-headedness, numbness and headaches.  Hematological: Denies adenopathy. Easy bruising, personal or family bleeding history  Psychiatric/Behavioral: Denies suicidal ideation, mood changes, confusion, nervousness, sleep disturbance and agitation    Physical Exam: Vitals:   08/14/21 1302  BP: 130/80  Pulse: 68  Temp: 98.3 F (36.8 C)  TempSrc: Oral  SpO2: 97%  Weight: 171 lb 8 oz (77.8 kg)  Height: 5\' 11"  (1.803 m)    Body mass index is 23.92 kg/m.   Constitutional: NAD, calm,  comfortable Eyes: PERRL, lids and conjunctivae normal, wears corrective lenses ENMT: Mucous membranes are moist. Posterior pharynx clear of any exudate or lesions. Normal dentition. Tympanic membrane is pearly white, no erythema or bulging. Neck: normal, supple, no masses, no thyromegaly Respiratory: clear to auscultation bilaterally, no wheezing, no crackles. Normal respiratory effort. No accessory muscle use.  Cardiovascular: Regular rate and rhythm, no murmurs / rubs / gallops. No extremity edema. 2+ pedal pulses. No carotid bruits.  Abdomen: no tenderness, no masses palpated. No hepatosplenomegaly. Bowel sounds positive.  Musculoskeletal: no clubbing / cyanosis. No joint deformity upper and lower extremities. Good ROM, no contractures. Normal muscle tone.  Skin: no rashes, lesions, ulcers. No induration Neurologic: CN 2-12 grossly intact. Sensation intact, DTR normal. Strength 5/5 in all 4.  Psychiatric: Normal judgment and insight. Alert and oriented x 3. Normal mood.    Subsequent Medicare wellness visit   1. Risk factors, based on past   M,S,F -cardiovascular disease risk factors include age, gender, history of hypertension, hyperlipidemia, type 2 diabetes, history of coronary artery disease   2.  Physical activities: He is very physically active   3.  Depression/mood: Stable, not depressed   4.  Hearing: Decreased hearing bilaterally   5.  ADL's: Independent in all ADLs   6.  Fall risk: Low fall risk   7.  Home safety: No problems identified   8.  Height weight, and visual acuity: height and weight as above, vision:  Vision Screening   Right eye Left eye Both eyes  Without correction 20/32 20/32 20/25   With correction        9.  Counseling: Advised to update vaccination status   10. Lab orders based on risk factors: Laboratory update will be reviewed   11. Referral : Audiology   12. Care plan: Follow-up with me in 3 to 6 months   13. Cognitive assessment: No cognitive impairment   14. Screening: Patient provided with a written and personalized 5-10 year screening schedule in the AVS. yes   15. Provider List Update: PCP, cardiology Dr. Percival Spanish, ophthalmology  16. Advance Directives: Full code   17. Opioids: Patient is not on any opioid prescriptions and has no risk factors for a substance use disorder.   [place] Office Visit from 08/14/2021 in [place] at [place]  PHQ-9 Total Score 1       Fall Risk 09/07/2018 09/13/2018 06/15/2019 09/12/2019 08/14/2021  Falls in the past year? 0 1 0 0 1  Was there an injury with Fall? - 0 0 - 0  Fall Risk Category Calculator - 2 0 - 1  Fall Risk Category - Moderate Low - Low  Patient at Risk for Falls Due to - - - - -  Fall risk Follow up - - - - -     Impression and Plan:  Encounter for subsequent annual wellness visit (AWV) in Medicare patient -Recommend routine eye and dental care. -Immunizations: Flu vaccine today, he will get shingles immunizations at pharmacy, otherwise up-to-date -Healthy lifestyle discussed in detail. -Labs  to be updated today. -Colon cancer screening: August 2022 -Breast cancer screening: Not applicable -Cervical cancer screening: Not applicable -Lung cancer screening: He follows with the pulmonary lung cancer screening program. -Prostate cancer screening: Deferred -DEXA: Not applicable   Dyslipidemia  - Plan: simvastatin (ZOCOR) 20 MG tablet  Diabetes mellitus without complication ([place])  - Plan: metFORMIN (GLUCOPHAGE) 500 MG tablet, Hemoglobin A1c  Genital herpes simplex, unspecified site  - Plan: acyclovir (ZOVIRAX)  400 MG tablet to take as needed.  Coronary artery disease involving native coronary artery of native heart without angina pectoris  - Plan: Lipid panel -In 2021 he had a stress test as well as a coronary CT, he is followed by Dr. Percival Spanish.  He has done well in the past year without any chest pain or dyspnea on exertion.  Essential hypertension  - Plan: CBC with Differential/Platelet, Comprehensive metabolic panel -Well-controlled today.  Onychomycosis -He will try topical antifungals and report back.  Need for influenza vaccination -Flu vaccine today.    Patient Instructions  -Nice seeing you today!!  -Lab work today; will notify you once results are available.  -Flu vaccine today.  -Remember shingles vaccinations at the pharmacy.  -Schedule follow up in 6 months.      Lelon Frohlich, MD [facility]

## 2021-08-14 NOTE — Patient Instructions (Signed)
-  Nice seeing you today!!  -Lab work today; will notify you once results are available.  -Flu vaccine today.  -Remember shingles vaccinations at the pharmacy.  -Schedule follow up in 6 months.

## 2021-08-14 NOTE — Addendum Note (Signed)
Addended by: Westley Hummer B on: 08/14/2021 02:35 PM   Modules accepted: Orders

## 2021-08-19 ENCOUNTER — Ambulatory Visit: Payer: Medicare Other | Attending: Internal Medicine | Admitting: Audiologist

## 2021-08-19 ENCOUNTER — Other Ambulatory Visit: Payer: Self-pay

## 2021-08-19 DIAGNOSIS — H903 Sensorineural hearing loss, bilateral: Secondary | ICD-10-CM | POA: Insufficient documentation

## 2021-08-19 NOTE — Procedures (Signed)
  Outpatient Audiology and [place], [place]  [address] [phone]  AUDIOLOGICAL  EVALUATION  NAME: [name]     DOB:   1941/10/06      MRN: [id number]                                                                                     DATE: 08/19/2021     REFERENT: Isaac Bliss, Rayford Halsted, MD STATUS: Outpatient DIAGNOSIS: Sensorineural Hearing Loss Bilateral     History: [name] was seen for an audiological evaluation.  [name] is receiving a hearing evaluation due to concerns for hearing when people are not facing him. [name] was encouraged to get his hearing tested by his wife. [name] has difficulty hearing in background noise, crowds, and when people are at a distance or have their back turned. This difficulty began gradually. No pain or pressure reported in either ear. Tinnitus denied for both ears. 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  Medical history negative for a condition which is a risk factor for hearing loss. No other relevant case history reported.   Evaluation:  Otoscopy showed a clear view of the tympanic membranes, bilaterally Tympanometry was attempted but a seal could not be achieved Audiometric testing was completed using conventional audiometry with insert transducer. Speech Recognition Thresholds were consistent with pure tone averages. Word Recognition was  excellent at conversation and an elevated level. Pure tone thresholds show normal sloping after 2k Hz to a moderately severe sensorineural hearing loss in both ears. Test results are consistent with age and noise related hearing changes.   Results:  The test results were reviewed with [name]. He was counseled on the nature and degree of hiss hearing loss, he  was provided with several copies of his audiogram that illustrate his degree of hearing loss in both ears. his hearing loss is in the high frequencies preventing [name] from hearing  high frequency consonants such as /s/ /sh/ /f/ /t/ and /th/. These sounds help differentiate the words he  hears. Without these sounds, speech is muffled and unclear unless someone is face to face within 5 feet without a mask. On testing today [name] understood 100% of random words at conversational level, he is not a good hearing aid candidate at this time.   Recommendations: Have annual audiometric testing to monitor hearing loss progression.  Communicate face to face within five feet, especially in the presence of background noise, to minimize communication breakdown.  Alfonse Alpers  Audiologist, Au.D., CCC-A 08/19/2021  1:38 PM  Cc: Isaac Bliss, Rayford Halsted, MD

## 2021-08-25 ENCOUNTER — Other Ambulatory Visit: Payer: Self-pay

## 2021-08-25 DIAGNOSIS — Z87891 Personal history of nicotine dependence: Secondary | ICD-10-CM

## 2021-08-25 DIAGNOSIS — R34 Anuria and oliguria: Secondary | ICD-10-CM | POA: Diagnosis not present

## 2021-08-25 DIAGNOSIS — F17210 Nicotine dependence, cigarettes, uncomplicated: Secondary | ICD-10-CM

## 2021-08-25 DIAGNOSIS — N202 Calculus of kidney with calculus of ureter: Secondary | ICD-10-CM | POA: Diagnosis not present

## 2021-08-25 DIAGNOSIS — N5201 Erectile dysfunction due to arterial insufficiency: Secondary | ICD-10-CM | POA: Diagnosis not present

## 2021-09-17 ENCOUNTER — Other Ambulatory Visit: Payer: Self-pay

## 2021-09-17 ENCOUNTER — Ambulatory Visit
Admission: RE | Admit: 2021-09-17 | Discharge: 2021-09-17 | Disposition: A | Discharge status: Home / Self Care | Payer: Medicare Other | Source: Ambulatory Visit | Attending: Acute Care | Admitting: Acute Care

## 2021-09-17 DIAGNOSIS — Z87891 Personal history of nicotine dependence: Secondary | ICD-10-CM | POA: Diagnosis not present

## 2021-09-17 DIAGNOSIS — F17210 Nicotine dependence, cigarettes, uncomplicated: Secondary | ICD-10-CM

## 2021-09-21 DIAGNOSIS — N5201 Erectile dysfunction due to arterial insufficiency: Secondary | ICD-10-CM | POA: Diagnosis not present

## 2021-09-24 ENCOUNTER — Other Ambulatory Visit: Payer: Self-pay | Admitting: Acute Care

## 2021-09-24 DIAGNOSIS — F17210 Nicotine dependence, cigarettes, uncomplicated: Secondary | ICD-10-CM

## 2021-09-24 DIAGNOSIS — Z87891 Personal history of nicotine dependence: Secondary | ICD-10-CM

## 2021-10-05 ENCOUNTER — Encounter: Payer: Self-pay | Admitting: Family Medicine

## 2021-10-05 ENCOUNTER — Telehealth (INDEPENDENT_AMBULATORY_CARE_PROVIDER_SITE_OTHER): Payer: Medicare Other | Admitting: Family Medicine

## 2021-10-05 ENCOUNTER — Encounter: Payer: Self-pay | Admitting: Internal Medicine

## 2021-10-05 DIAGNOSIS — I2510 Atherosclerotic heart disease of native coronary artery without angina pectoris: Secondary | ICD-10-CM | POA: Diagnosis not present

## 2021-10-05 DIAGNOSIS — U071 COVID-19: Secondary | ICD-10-CM

## 2021-10-05 DIAGNOSIS — Z20822 Contact with and (suspected) exposure to covid-19: Secondary | ICD-10-CM | POA: Diagnosis not present

## 2021-10-05 MED ORDER — MOLNUPIRAVIR EUA 200MG CAPSULE: 4.0000 | ORAL_CAPSULE | Freq: Two times a day (BID) | ORAL | 0 refills | Status: AC

## 2021-10-05 NOTE — Progress Notes (Signed)
Virtual Visit via Video Note  I connected with [name] on 10/05/21 at  4:30 PM EST by a video enabled telemedicine application 2/2 [id number] pandemic and verified that I am speaking with the correct person using two identifiers.  Location patient: home Location provider:work or home office Persons participating in the virtual visit: patient, provider  I discussed the limitations of evaluation and management by telemedicine and the availability of in person appointments. The patient expressed understanding and agreed to proceed.  Chief Complaint  Patient presents with   Covid Positive    Sore throat,feels it in the chest, runny nose, sore joints, taken excedrin and mucinex. Tested positive today, was negative yesterday. Wife has [place] and was given the antiviral, pt stated he has not taken his statin, due to wife being told not to take it while taking the antiviral.    HPI: Pt is a 80 yo male with pmh sig for arthritis, HTN, CAD, diverticulosis, GERD, IBS, DM, and renal calculi who is followed by Dr. Jerilee Hoh and seen today for acute concern.  Pt endorses sore throat, joint soreness, rhinorrhea, HA, cough mildly productive, and scratchy throat.  States symptoms started overnight.  Pt had a positive at home COVID test today.  Denies fever, ear pain/pressure, facial pain/pressure, SOB.  Pt tried Mucinex , exedrin, and cough drops today.  Pt's wife with COVID symptoms Friday, tested positive a few days later, currently on antiviral meds.  Pt is fully vaccinated and boosted.  ROS: See pertinent positives and negatives per HPI.  Past Medical History:  Diagnosis Date   Arthritis    generalized   Cataract    BILATERAL-REMOVED   Cigarette smoker    Colon polyp    COPD (chronic obstructive pulmonary disease) (HCC)    Pt reports does not have history of COPD   Coronary artery disease    nonobstructive   Diabetes mellitus without complication (HCC)    on meds/diet controlled    Diverticulosis of colon    Gastritis    Hemorrhoids    Hiatal hernia    late 1990's   History of colonic polyps - ? attenuated polyposis 07/27/2007   Adenomas in past, mother w/ probable colon cancer 2004 - none 04/2009 Polyps, multiple (6, max size 8 mm) ADENOMAS 03/2013 - 22mm adenoma (1) - 07/02/2019 11 diminutive polyps removed - adenomas Gatha Mayer, MD, [facility]      History of kidney stones    History of renal calculi    Hyperlipidemia    on meds   IBS (irritable bowel syndrome)    Lumbar back pain    Pre-diabetes     Past Surgical History:  Procedure Laterality Date   ANTERIOR LAT LUMBAR FUSION  08/14/2012   Procedure: ANTERIOR LATERAL LUMBAR FUSION 1 LEVEL;  Surgeon: Charlie Pitter, MD;  Location: [place] [facility];  Service: Neurosurgery;  Laterality: Left;  Left lumbar three-four extreme lumbar interbody fusion with percutaneous pedicle screws   BACK SURGERY     x 4   [facility] in [place], 1995 dr deaton   COLONOSCOPY  2021   with polyp removed x 3 or 4   COLONOSCOPY W/ BIOPSIES AND POLYPECTOMY  05/06/2009   adenomous polyp, diverticulosis, external hemorrhoids   CYSTOSCOPY  11/03   TURP and stone removal - Dr Amalia Hailey   CYSTOSCOPY WITH RETROGRADE PYELOGRAM, URETEROSCOPY AND STENT PLACEMENT Bilateral 06/20/2020   Procedure: CYSTOSCOPY WITH BILATERAL  RETROGRADE PYELOGRAM, LEFT  URETEROSCOPY AND STENT PLACEMENT;  Surgeon: Alexis Frock, MD;  Location: [facility];  Service: Urology;  Laterality: Bilateral;  75 MINS   dental implants  2007   EYE SURGERY Bilateral 2019   cataracts both eyes   HOLMIUM LASER APPLICATION Left 05/19/9561   Procedure: HOLMIUM LASER APPLICATION;  Surgeon: Alexis Frock, MD;  Location: [facility];  Service: Urology;  Laterality: Left;   LITHOTRIPSY     x 3   LUMBAR MICRODISCECTOMY  12/07   L3-4 by Dr Annette Stable   LUMBAR PERCUTANEOUS PEDICLE SCREW 1 LEVEL  08/14/2012   Procedure: LUMBAR PERCUTANEOUS  PEDICLE SCREW 1 LEVEL;  Surgeon: Charlie Pitter, MD;  Location: [place] [facility];  Service: Neurosurgery;  Laterality: Left;  Left lumbar three-four extreme lumbar interbody fusion with percutaneous pedicle screws    ROTATOR CUFF REPAIR     left shoulder 2014, right shoulder 2018   UPPER GASTROINTESTINAL ENDOSCOPY  09/07/2010   erosive gastritis, GERD   UVULOPALATOPHARYNGOPLASTY  2001   Dr Wilburn Cornelia    Family History  Problem Relation Age of Onset   Liver cancer Mother    Colon polyps Mother 15   Colon cancer Mother 69       mets to other organs   Diabetes Father    Kidney failure Father    Prostate cancer Brother    Esophageal cancer Neg Hx    Rectal cancer Neg Hx    Stomach cancer Neg Hx     Current Outpatient Medications:    acyclovir (ZOVIRAX) 400 MG tablet, Take 1 tablet (400 mg total) by mouth daily at 6 (six) AM., Disp: 90 tablet, Rfl: 1   COVID-19 mRNA bivalent vaccine, Pfizer, (PFIZER COVID-19 VAC BIVALENT) injection, Inject into the muscle., Disp: 0.3 mL, Rfl: 0   indapamide (LOZOL) 1.25 MG tablet, Take 1.25 mg by mouth daily., Disp: , Rfl:    metFORMIN (GLUCOPHAGE) 500 MG tablet, Take 1 tablet (500 mg total) by mouth daily., Disp: 90 tablet, Rfl: 1   Multiple Vitamins-Minerals (MULTIVITAMIN ADULT, MINERALS,) TABS, Take 1 tablet by mouth 2 (two) times daily., Disp: , Rfl:    Omega 3-5-6-7-9 Fatty Acids (COMPLETE OMEGA) CAPS, Take 1 tablet by mouth daily at 6 (six) AM. Ultimate Omega, Disp: , Rfl:    simvastatin (ZOCOR) 20 MG tablet, TAKE 1 TABLET(20 MG) BY MOUTH AT BEDTIME, Disp: 90 tablet, Rfl: 1  EXAM:  VITALS per patient if applicable:  RR between 12-20 bpm  GENERAL: alert, oriented, appears well and in no acute distress  HEENT: atraumatic, conjunctiva clear, no obvious abnormalities on inspection of external nose and ears  NECK: normal movements of the head and neck  LUNGS: on inspection no signs of respiratory distress, breathing rate appears normal, no obvious  gross SOB, gasping or wheezing  CV: no obvious cyanosis  MS: moves all visible extremities without noticeable abnormality  PSYCH/NEURO: pleasant and cooperative, no obvious depression or anxiety, speech and thought processing grossly intact  ASSESSMENT AND PLAN:  Discussed the following assessment and plan:  COVID-19 virus infection  -Symptoms starting yesterday evening 12/18.  With positive COVID test 10/05/21 -continue supportive care rest, hydration, OTC cough/cold meds, etc. -discussed r/b/a of antiviral meds.  Pt wishes to start medication. -given strict precautions - Plan: molnupiravir EUA (LAGEVRIO) 200 mg CAPS capsule  F/u prn with pcp   I discussed the assessment and treatment plan with the patient. The patient was provided an opportunity to ask questions and all were answered.  The patient agreed with the plan and demonstrated an understanding of the instructions.   The patient was advised to call back or seek an in-person evaluation if the symptoms worsen or if the condition fails to improve as anticipated.  Billie Ruddy, MD

## 2021-10-08 ENCOUNTER — Telehealth: Payer: Self-pay | Admitting: Acute Care

## 2021-10-08 ENCOUNTER — Other Ambulatory Visit: Payer: Self-pay | Admitting: Acute Care

## 2021-10-08 DIAGNOSIS — Z87891 Personal history of nicotine dependence: Secondary | ICD-10-CM

## 2021-10-08 NOTE — Telephone Encounter (Signed)
Notified patient that his Medicare plan no longer pays for LDCT through lung cancer screening after age 78.  This is the last scan through our program. Future Chest CT would be ordered by the PCP provider. We will no longer schedule the annual LDCT. Patient acknowledged understanding.

## 2021-10-17 DIAGNOSIS — J40 Bronchitis, not specified as acute or chronic: Secondary | ICD-10-CM | POA: Diagnosis not present

## 2021-10-17 DIAGNOSIS — Z20822 Contact with and (suspected) exposure to covid-19: Secondary | ICD-10-CM | POA: Diagnosis not present

## 2021-10-29 ENCOUNTER — Encounter: Payer: Self-pay | Admitting: Internal Medicine

## 2021-11-23 ENCOUNTER — Ambulatory Visit (INDEPENDENT_AMBULATORY_CARE_PROVIDER_SITE_OTHER): Payer: Medicare Other | Admitting: Internal Medicine

## 2021-11-23 ENCOUNTER — Encounter: Payer: Self-pay | Admitting: Internal Medicine

## 2021-11-23 VITALS — BP 130/80 | HR 82 | Temp 98.1°F | Wt 170.4 lb

## 2021-11-23 DIAGNOSIS — E785 Hyperlipidemia, unspecified: Secondary | ICD-10-CM | POA: Diagnosis not present

## 2021-11-23 DIAGNOSIS — E119 Type 2 diabetes mellitus without complications: Secondary | ICD-10-CM

## 2021-11-23 DIAGNOSIS — I10 Essential (primary) hypertension: Secondary | ICD-10-CM

## 2021-11-23 LAB — POCT GLYCOSYLATED HEMOGLOBIN (HGB A1C): Hemoglobin A1C: 7.3 % — AB (ref 4.0–5.6)

## 2021-11-23 MED ORDER — SIMVASTATIN 20 MG PO TABS: ORAL_TABLET | ORAL | 1 refills | Status: DC

## 2021-11-23 MED ORDER — METFORMIN HCL 500 MG PO TABS: 500.0000 mg | ORAL_TABLET | Freq: Two times a day (BID) | ORAL | 1 refills | Status: DC

## 2021-11-23 NOTE — Progress Notes (Signed)
Established Patient Office Visit     This visit occurred during the SARS-CoV-2 public health emergency.  Safety protocols were in place, including screening questions prior to the visit, additional usage of staff PPE, and extensive cleaning of exam room while observing appropriate contact time as indicated for disinfecting solutions.    CC/Reason for Visit: Follow-up chronic medical conditions  HPI: [name] is a 81 y.o. male who is coming in today for the above mentioned reasons. Past Medical History is significant for: Hypertension, hyperlipidemia, type 2 diabetes.  He states that about a year ago metformin was decreased from 500 mg twice daily to once daily.  Ever since then his A1c has been around 7.3.  He is already very healthy in regards to lifestyle.  Other than this he has no concerns.  He is requesting refills of his simvastatin.   Past Medical/Surgical History: Past Medical History:  Diagnosis Date   Arthritis    generalized   Cataract    BILATERAL-REMOVED   Cigarette smoker    Colon polyp    COPD (chronic obstructive pulmonary disease) (HCC)    Pt reports does not have history of COPD   Coronary artery disease    nonobstructive   Diabetes mellitus without complication (HCC)    on meds/diet controlled   Diverticulosis of colon    Gastritis    Hemorrhoids    Hiatal hernia    late 1990's   History of colonic polyps - ? attenuated polyposis 07/27/2007   Adenomas in past, mother w/ probable colon cancer 2004 - none 04/2009 Polyps, multiple (6, max size 8 mm) ADENOMAS 03/2013 - 73mm adenoma (1) - 07/02/2019 11 diminutive polyps removed - adenomas Gatha Mayer, MD, [facility]      History of kidney stones    History of renal calculi    Hyperlipidemia    on meds   IBS (irritable bowel syndrome)    Lumbar back pain    Pre-diabetes     Past Surgical History:  Procedure Laterality Date   ANTERIOR LAT LUMBAR FUSION  08/14/2012   Procedure: ANTERIOR LATERAL  LUMBAR FUSION 1 LEVEL;  Surgeon: Charlie Pitter, MD;  Location: [place] [facility];  Service: Neurosurgery;  Laterality: Left;  Left lumbar three-four extreme lumbar interbody fusion with percutaneous pedicle screws   BACK SURGERY     x 4   [facility] in [place], 1995 dr deaton   COLONOSCOPY  2021   with polyp removed x 3 or 4   COLONOSCOPY W/ BIOPSIES AND POLYPECTOMY  05/06/2009   adenomous polyp, diverticulosis, external hemorrhoids   CYSTOSCOPY  11/03   TURP and stone removal - Dr Amalia Hailey   CYSTOSCOPY WITH RETROGRADE PYELOGRAM, URETEROSCOPY AND STENT PLACEMENT Bilateral 06/20/2020   Procedure: CYSTOSCOPY WITH BILATERAL  RETROGRADE PYELOGRAM, LEFT URETEROSCOPY AND STENT PLACEMENT;  Surgeon: Alexis Frock, MD;  Location: [facility];  Service: Urology;  Laterality: Bilateral;  75 MINS   dental implants  2007   EYE SURGERY Bilateral 2019   cataracts both eyes   HOLMIUM LASER APPLICATION Left 06/20/7168   Procedure: HOLMIUM LASER APPLICATION;  Surgeon: Alexis Frock, MD;  Location: [facility];  Service: Urology;  Laterality: Left;   LITHOTRIPSY     x 3   LUMBAR MICRODISCECTOMY  12/07   L3-4 by Dr Annette Stable   LUMBAR PERCUTANEOUS PEDICLE SCREW 1 LEVEL  08/14/2012   Procedure: [place]  1 LEVEL;  Surgeon: Charlie Pitter, MD;  Location: [place] [facility];  Service: Neurosurgery;  Laterality: Left;  Left lumbar three-four extreme lumbar interbody fusion with percutaneous pedicle screws    ROTATOR CUFF REPAIR     left shoulder 2014, right shoulder 2018   UPPER GASTROINTESTINAL ENDOSCOPY  09/07/2010   erosive gastritis, GERD   UVULOPALATOPHARYNGOPLASTY  2001   Dr Wilburn Cornelia    Social History:  reports that he has been smoking cigarettes. He has a 40.80 pack-year smoking history. He has never used smokeless tobacco. He reports current alcohol use of about 5.0 standard drinks per week. He reports that he does not use  drugs.  Allergies: No Known Allergies  Family History:  Family History  Problem Relation Age of Onset   Liver cancer Mother    Colon polyps Mother 46   Colon cancer Mother 38       mets to other organs   Diabetes Father    Kidney failure Father    Prostate cancer Brother    Esophageal cancer Neg Hx    Rectal cancer Neg Hx    Stomach cancer Neg Hx      Current Outpatient Medications:    acyclovir (ZOVIRAX) 400 MG tablet, Take 1 tablet (400 mg total) by mouth daily at 6 (six) AM., Disp: 90 tablet, Rfl: 1   indapamide (LOZOL) 1.25 MG tablet, Take 1.25 mg by mouth daily., Disp: , Rfl:    Multiple Vitamins-Minerals (MULTIVITAMIN ADULT, MINERALS,) TABS, Take 1 tablet by mouth 2 (two) times daily., Disp: , Rfl:    Omega 3-5-6-7-9 Fatty Acids (COMPLETE OMEGA) CAPS, Take 1 tablet by mouth daily at 6 (six) AM. Ultimate Omega, Disp: , Rfl:    metFORMIN (GLUCOPHAGE) 500 MG tablet, Take 1 tablet (500 mg total) by mouth 2 (two) times daily with a meal., Disp: 180 tablet, Rfl: 1   simvastatin (ZOCOR) 20 MG tablet, TAKE 1 TABLET(20 MG) BY MOUTH AT BEDTIME, Disp: 90 tablet, Rfl: 1  Review of Systems:  Constitutional: Denies fever, chills, diaphoresis, appetite change and fatigue.  HEENT: Denies photophobia, eye pain, redness, hearing loss, ear pain, congestion, sore throat, rhinorrhea, sneezing, mouth sores, trouble swallowing, neck pain, neck stiffness and tinnitus.   Respiratory: Denies SOB, DOE, cough, chest tightness,  and wheezing.   Cardiovascular: Denies chest pain, palpitations and leg swelling.  Gastrointestinal: Denies nausea, vomiting, abdominal pain, diarrhea, constipation, blood in stool and abdominal distention.  Genitourinary: Denies dysuria, urgency, frequency, hematuria, flank pain and difficulty urinating.  Endocrine: Denies: hot or cold intolerance, sweats, changes in hair or nails, polyuria, polydipsia. Musculoskeletal: Denies myalgias, back pain, joint swelling, arthralgias  and gait problem.  Skin: Denies pallor, rash and wound.  Neurological: Denies dizziness, seizures, syncope, weakness, light-headedness, numbness and headaches.  Hematological: Denies adenopathy. Easy bruising, personal or family bleeding history  Psychiatric/Behavioral: Denies suicidal ideation, mood changes, confusion, nervousness, sleep disturbance and agitation    Physical Exam: Vitals:   11/23/21 0853  BP: 130/80  Pulse: 82  Temp: 98.1 F (36.7 C)  TempSrc: Oral  SpO2: 99%  Weight: 170 lb 6.4 oz (77.3 kg)    Body mass index is 23.77 kg/m.   Constitutional: NAD, calm, comfortable Eyes: PERRL, lids and conjunctivae normal ENMT: Mucous membranes are moist.  Respiratory: clear to auscultation bilaterally, no wheezing, no crackles. Normal respiratory effort. No accessory muscle use.  Cardiovascular: Regular rate and rhythm, no murmurs / rubs / gallops. No extremity edema.  Neurologic: Grossly intact and nonfocal Psychiatric:  Normal judgment and insight. Alert and oriented x 3. Normal mood.    Impression and Plan:  Diabetes mellitus without complication ([place])  - Plan: POCT glycosylated hemoglobin (Hb A1C), metFORMIN (GLUCOPHAGE) 500 MG tablet -In office A1c is again 7.3. -Increase metformin from 500 mg daily to twice daily and recheck A1c in 3 months.  He will continue lifestyle modifications.  Dyslipidemia  - Plan: simvastatin (ZOCOR) 20 MG tablet -Last lipid panel in October 2022 with a total cholesterol of 137, triglycerides 103 and LDL 68.  Essential hypertension -Blood pressure is fairly well controlled, continue current regimen  Time spent: 31 minutes reviewing chart, interviewing and examining patient and formulating plan of care.   Patient Instructions  -Nice seeing you today!!  -Increase metformin to 500 mg twice daily.  -Schedule follow up in 3 months.    Lelon Frohlich, MD [facility]

## 2021-11-23 NOTE — Patient Instructions (Signed)
-  Nice seeing you today!!  -Increase metformin to 500 mg twice daily.  -Schedule follow up in 3 months.

## 2021-12-23 ENCOUNTER — Encounter: Payer: Self-pay | Admitting: Internal Medicine

## 2022-02-15 ENCOUNTER — Encounter: Payer: Self-pay | Admitting: Internal Medicine

## 2022-02-15 ENCOUNTER — Ambulatory Visit (INDEPENDENT_AMBULATORY_CARE_PROVIDER_SITE_OTHER): Payer: Medicare Other | Admitting: Internal Medicine

## 2022-02-15 VITALS — BP 136/82 | HR 75 | Temp 98.2°F | Ht 71.0 in | Wt 166.4 lb

## 2022-02-15 DIAGNOSIS — I10 Essential (primary) hypertension: Secondary | ICD-10-CM | POA: Diagnosis not present

## 2022-02-15 DIAGNOSIS — E785 Hyperlipidemia, unspecified: Secondary | ICD-10-CM

## 2022-02-15 DIAGNOSIS — E119 Type 2 diabetes mellitus without complications: Secondary | ICD-10-CM | POA: Diagnosis not present

## 2022-02-15 LAB — MICROALBUMIN / CREATININE URINE RATIO
Creatinine,U: 118.1 mg/dL
Microalb Creat Ratio: 6.6 mg/g (ref 0.0–30.0)
Microalb, Ur: 7.7 mg/dL — ABNORMAL HIGH (ref 0.0–1.9)

## 2022-02-15 LAB — POCT GLYCOSYLATED HEMOGLOBIN (HGB A1C): Hemoglobin A1C: 6.4 % — AB (ref 4.0–5.6)

## 2022-02-15 NOTE — Progress Notes (Signed)
? ? ? ?Established Patient Office Visit ? ? ? ? ?This visit occurred during the SARS-CoV-2 public health emergency.  Safety protocols were in place, including screening questions prior to the visit, additional usage of staff PPE, and extensive cleaning of exam room while observing appropriate contact time as indicated for disinfecting solutions.  ? ? ?CC/Reason for Visit: Follow-up chronic conditions ? ?HPI: [name] is a 81 y.o. male who is coming in today for the above mentioned reasons. Past Medical History is significant for: Hypertension, hyperlipidemia, type 2 diabetes.  He is feeling well and has no acute concerns or complaints.  At last visit his A1c was 7.3.  He was advised to take metformin 500 mg twice daily instead of once daily.  He has been doing this.  No side effects to report.  He states his home blood pressures have been between 503-546 systolic with diastolics in the 56C to 12X. ? ? ?Past Medical/Surgical History: ?Past Medical History:  ?Diagnosis Date  ? Arthritis   ? generalized  ? Cataract   ? BILATERAL-REMOVED  ? Cigarette smoker   ? Colon polyp   ? COPD (chronic obstructive pulmonary disease) ([place])   ? Pt reports does not have history of COPD  ? Coronary artery disease   ? nonobstructive  ? Diabetes mellitus without complication ([place])   ? on meds/diet controlled  ? Diverticulosis of colon   ? Gastritis   ? Hemorrhoids   ? Hiatal hernia   ? late 1990's  ? History of colonic polyps - ? attenuated polyposis 07/27/2007  ? Adenomas in past, mother w/ probable colon cancer 2004 - none 04/2009 Polyps, multiple (6, max size 8 mm) ADENOMAS 03/2013 - 52m adenoma (1) - 07/02/2019 11 diminutive polyps removed - adenomas [facility]    ? History of kidney stones   ? History of renal calculi   ? Hyperlipidemia   ? on meds  ? IBS (irritable bowel syndrome)   ? Lumbar back pain   ? Pre-diabetes   ? ? ?Past Surgical History:  ?Procedure Laterality Date  ? ANTERIOR LAT LUMBAR FUSION   08/14/2012  ? Procedure: ANTERIOR LATERAL LUMBAR FUSION 1 LEVEL;  Surgeon: HCharlie Pitter MD;  [facility] [place] [facility];  Service: Neurosurgery;  Laterality: Left;  Left lumbar three-four extreme lumbar interbody fusion with percutaneous pedicle screws  ? BACK [place]    ? [place]  [place]  ? COLONOSCOPY  2021  ? with polyp removed x 3 or 4  ? COLONOSCOPY W/ BIOPSIES AND POLYPECTOMY  05/06/2009  ? adenomous polyp, diverticulosis, external hemorrhoids  ? CYSTOSCOPY  11/03  ? TURP and stone removal - Dr EAmalia Hailey ? CYSTOSCOPY WITH RETROGRADE PYELOGRAM, URETEROSCOPY AND STENT PLACEMENT Bilateral 06/20/2020  ? Procedure: CYSTOSCOPY WITH BILATERAL  RETROGRADE PYELOGRAM, LEFT URETEROSCOPY AND STENT PLACEMENT;  Surgeon: MAlexis Frock MD;  [facility]  Service: Urology;  Laterality: Bilateral;  75 MINS  ? dental implants  2007  ? EYE SURGERY Bilateral 2019  ? cataracts both eyes  ? HOLMIUM LASER APPLICATION Left 95/10/6999 ? Procedure: HOLMIUM LASER APPLICATION;  Surgeon: MAlexis Frock MD;  [facility]  Service: Urology;  Laterality: Left;  ? LITHOTRIPSY    ? x 3  ? LUMBAR MICRODISCECTOMY  12/07  ? L3-4 by Dr PAnnette Stable ? LUMBAR PERCUTANEOUS PEDICLE SCREW 1  LEVEL  08/14/2012  ? Procedure: LUMBAR PERCUTANEOUS PEDICLE SCREW 1 LEVEL;  Surgeon: Charlie Pitter, MD;  Location: [place] [facility];  Service: Neurosurgery;  Laterality: Left;  Left lumbar three-four extreme lumbar interbody fusion with percutaneous pedicle screws ?  ? ROTATOR CUFF REPAIR    ? left shoulder 2014, right shoulder 2018  ? UPPER GASTROINTESTINAL ENDOSCOPY  09/07/2010  ? erosive gastritis, GERD  ? UVULOPALATOPHARYNGOPLASTY  2001  ? Dr Wilburn Cornelia  ? ? ?Social History: ? reports that he has been smoking cigarettes. He has a 40.80 pack-year smoking history. He has never used smokeless tobacco. He reports current alcohol use of about 5.0 standard drinks per week. He  reports that he does not use drugs. ? ?Allergies: ?No Known Allergies ? ?Family History:  ?Family History  ?Problem Relation Age of Onset  ? Liver cancer Mother   ? Colon polyps Mother 2  ? Colon cancer Mother 69  ?     mets to other organs  ? Diabetes Father   ? Kidney failure Father   ? Prostate cancer Brother   ? Esophageal cancer Neg Hx   ? Rectal cancer Neg Hx   ? Stomach cancer Neg Hx   ? ? ? ?Current Outpatient Medications:  ?  acyclovir (ZOVIRAX) 400 MG tablet, Take 1 tablet (400 mg total) by mouth daily at 6 (six) AM., Disp: 90 tablet, Rfl: 1 ?  indapamide (LOZOL) 1.25 MG tablet, Take 1.25 mg by mouth daily., Disp: , Rfl:  ?  metFORMIN (GLUCOPHAGE) 500 MG tablet, Take 1 tablet (500 mg total) by mouth 2 (two) times daily with a meal., Disp: 180 tablet, Rfl: 1 ?  Multiple Vitamins-Minerals (MULTIVITAMIN ADULT, MINERALS,) TABS, Take 1 tablet by mouth 2 (two) times daily., Disp: , Rfl:  ?  Omega 3-5-6-7-9 Fatty Acids (COMPLETE OMEGA) CAPS, Take 1 tablet by mouth daily at 6 (six) AM. Ultimate Omega, Disp: , Rfl:  ?  simvastatin (ZOCOR) 20 MG tablet, TAKE 1 TABLET(20 MG) BY MOUTH AT BEDTIME, Disp: 90 tablet, Rfl: 1 ? ?Review of Systems:  ?Constitutional: Denies fever, chills, diaphoresis, appetite change and fatigue.  ?HEENT: Denies photophobia, eye pain, redness, hearing loss, ear pain, congestion, sore throat, rhinorrhea, sneezing, mouth sores, trouble swallowing, neck pain, neck stiffness and tinnitus.   ?Respiratory: Denies SOB, DOE, cough, chest tightness,  and wheezing.   ?Cardiovascular: Denies chest pain, palpitations and leg swelling.  ?Gastrointestinal: Denies nausea, vomiting, abdominal pain, diarrhea, constipation, blood in stool and abdominal distention.  ?Genitourinary: Denies dysuria, urgency, frequency, hematuria, flank pain and difficulty urinating.  ?Endocrine: Denies: hot or cold intolerance, sweats, changes in hair or nails, polyuria, polydipsia. ?Musculoskeletal: Denies myalgias, back pain,  joint swelling, arthralgias and gait problem.  ?Skin: Denies pallor, rash and wound.  ?Neurological: Denies dizziness, seizures, syncope, weakness, light-headedness, numbness and headaches.  ?Hematological: Denies adenopathy. Easy bruising, personal or family bleeding history  ?Psychiatric/Behavioral: Denies suicidal ideation, mood changes, confusion, nervousness, sleep disturbance and agitation ? ? ? ?Physical Exam: ?Vitals:  ? 02/15/22 0901  ?BP: 136/82  ?Pulse: 75  ?Temp: 98.2 ?F (36.8 ?C)  ?TempSrc: Oral  ?SpO2: 96%  ?Weight: 166 lb 6.4 oz (75.5 kg)  ?Height: '5\' 11"'$  (1.803 m)  ? ? ?Body mass index is 23.21 kg/m?. ? ? ?Constitutional: NAD, calm, comfortable ?Eyes: PERRL, lids and conjunctivae normal, wears corrective lenses ?ENMT: Mucous membranes are moist.  ?Respiratory: clear to auscultation bilaterally, no wheezing, no crackles. Normal respiratory effort. No accessory muscle use.  ?Cardiovascular: Regular  rate and rhythm, no murmurs / rubs / gallops. No extremity edema.  ?Neurologic: Grossly intact and nonfocal ?Psychiatric: Normal judgment and insight. Alert and oriented x 3. Normal mood.  ? ? ?Impression and Plan: ? ?Diabetes mellitus without complication ([place])  ?- Plan: POC HgB A1c, Microalbumin / creatinine urine ratio ?-A1c has improved to 6.4 with increasing metformin dose,. ?-Check microalbumin. ? ?Essential hypertension ?-Fair control in office, home measurements are within range. ? ?Dyslipidemia ?-At goal with an LDL of 68 in October 2022. ? ? ? ?Time spent:30 minutes reviewing chart, interviewing and examining patient and formulating plan of care. ? ? ? ? ?Lelon Frohlich, MD ?[facility] at [facility] ? ? ?

## 2022-02-16 ENCOUNTER — Other Ambulatory Visit: Payer: Self-pay | Admitting: *Deleted

## 2022-02-16 MED ORDER — LISINOPRIL 2.5 MG PO TABS: 2.5000 mg | ORAL_TABLET | Freq: Every day | ORAL | 1 refills | Status: DC

## 2022-02-22 ENCOUNTER — Other Ambulatory Visit: Payer: Self-pay | Admitting: Internal Medicine

## 2022-02-22 DIAGNOSIS — E785 Hyperlipidemia, unspecified: Secondary | ICD-10-CM

## 2022-03-02 DIAGNOSIS — D1039 Benign neoplasm of other parts of mouth: Secondary | ICD-10-CM | POA: Diagnosis not present

## 2022-04-22 ENCOUNTER — Encounter: Payer: Self-pay | Admitting: Internal Medicine

## 2022-04-22 DIAGNOSIS — R809 Proteinuria, unspecified: Secondary | ICD-10-CM

## 2022-04-23 ENCOUNTER — Encounter: Payer: Self-pay | Admitting: General Practice

## 2022-04-23 ENCOUNTER — Ambulatory Visit (INDEPENDENT_AMBULATORY_CARE_PROVIDER_SITE_OTHER): Payer: Medicare Other | Admitting: General Practice

## 2022-04-23 ENCOUNTER — Other Ambulatory Visit: Payer: Medicare Other

## 2022-04-23 VITALS — BP 130/68 | HR 80 | Ht 71.0 in | Wt 158.2 lb

## 2022-04-23 DIAGNOSIS — I10 Essential (primary) hypertension: Secondary | ICD-10-CM

## 2022-04-23 DIAGNOSIS — E782 Mixed hyperlipidemia: Secondary | ICD-10-CM | POA: Diagnosis not present

## 2022-04-23 DIAGNOSIS — E785 Hyperlipidemia, unspecified: Secondary | ICD-10-CM

## 2022-04-23 DIAGNOSIS — I2510 Atherosclerotic heart disease of native coronary artery without angina pectoris: Secondary | ICD-10-CM

## 2022-04-23 DIAGNOSIS — R5383 Other fatigue: Secondary | ICD-10-CM

## 2022-04-23 DIAGNOSIS — R809 Proteinuria, unspecified: Secondary | ICD-10-CM

## 2022-04-23 DIAGNOSIS — E118 Type 2 diabetes mellitus with unspecified complications: Secondary | ICD-10-CM | POA: Diagnosis not present

## 2022-04-23 LAB — BASIC METABOLIC PANEL
BUN: 29 mg/dL — ABNORMAL HIGH (ref 6–23)
CO2: 27 mEq/L (ref 19–32)
Calcium: 10.2 mg/dL (ref 8.4–10.5)
Chloride: 99 mEq/L (ref 96–112)
Creatinine, Ser: 1.17 mg/dL (ref 0.40–1.50)
GFR: 59.68 mL/min — ABNORMAL LOW (ref >60.00)
Glucose, Bld: 152 mg/dL — ABNORMAL HIGH (ref 70–99)
Potassium: 4.1 mEq/L (ref 3.5–5.1)
Sodium: 136 mEq/L (ref 135–145)

## 2022-04-23 MED ORDER — SIMVASTATIN 40 MG PO TABS: ORAL_TABLET | ORAL | 6 refills | Status: DC

## 2022-04-23 NOTE — Progress Notes (Signed)
Cardiology Clinic Note   Patient Name: [name] Date of Encounter: 04/23/2022  Primary Care Provider:  Isaac Bliss, Rayford Halsted, MD Primary Cardiologist:  Minus Breeding, MD  Patient Profile    [name]  81 year old male presents to the clinic today for  evaluation of his coronary artery disease and hyperlipidemia.  Past Medical History    Past Medical History:  Diagnosis Date   Arthritis    generalized   Cataract    BILATERAL-REMOVED   Cigarette smoker    Colon polyp    COPD (chronic obstructive pulmonary disease) (HCC)    Pt reports does not have history of COPD   Coronary artery disease    nonobstructive   Diabetes mellitus without complication ([place])    on meds/diet controlled   Diverticulosis of colon    Gastritis    Hemorrhoids    Hiatal hernia    late 1990's   History of colonic polyps - ? attenuated polyposis 07/27/2007   Adenomas in past, mother w/ probable colon cancer 2004 - none 04/2009 Polyps, multiple (6, max size 8 mm) ADENOMAS 03/2013 - 68m adenoma (1) - 07/02/2019 11 diminutive polyps removed - aden[facility]     History of kidney stones    History of renal calculi    Hyperlipidemia    on meds   IBS (irritable bowel syndrome)    Lumbar back pain    Pre-diabetes    Past Surgical History:  Procedure Laterality Date   ANTERIOR LAT LUMBAR FUSION  08/14/2012   Procedure: ANTERIOR LATERAL LUMBAR FUSION 1 LEVEL;  Surgeon: HCharlie Pitter MD;  [facility];  Service: Neurosurgery;  Laterality: Left;  Left lumbar three-four extreme lumbar interbody fusion with percutaneous pedicle screws   [place]     [place]   COLONOSCOPY  2021   with polyp removed x 3 or 4   COLONOSCOPY W/ BIOPSIES AND POLYPECTOMY  05/06/2009   adenomous polyp, diverticulosis, external hemorrhoids   CYSTOSCOPY  11/03   TURP and stone removal - Dr EAmalia Hailey  CYSTOSCOPY  WITH RETROGRADE PYELOGRAM, URETEROSCOPY AND STENT PLACEMENT Bilateral 06/20/2020   Procedure: CYSTOSCOPY WITH BILATERAL  RETROGRADE PYELOGRAM, LEFT URETEROSCOPY AND STENT PLACEMENT;  Surgeon: MAlexis Frock MD;  [facility]  Service: Urology;  Laterality: Bilateral;  75 MINS   dental implants  2007   EYE SURGERY Bilateral 2019   cataracts both eyes   HOLMIUM LASER APPLICATION Left 95/06/1637  Procedure: HOLMIUM LASER APPLICATION;  Surgeon: MAlexis Frock MD;  [facility]  Service: Urology;  Laterality: Left;   LITHOTRIPSY     x 3   LUMBAR MICRODISCECTOMY  12/07   L3-4 by Dr PAnnette Stable  LUMBAR PERCUTANEOUS PEDICLE SCREW 1 LEVEL  08/14/2012   Procedure: LUMBAR PERCUTANEOUS PEDICLE SCREW 1 LEVEL;  Surgeon: HCharlie Pitter MD;  [facility];  Service: Neurosurgery;  Laterality: Left;  Left lumbar three-four extreme lumbar interbody fusion with percutaneous pedicle screws    ROTATOR CUFF REPAIR     left shoulder 2014, right shoulder 2018   UPPER GASTROINTESTINAL ENDOSCOPY  09/07/2010   erosive gastritis, GERD   UVULOPALATOPHARYNGOPLASTY  2001   Dr SWilburn Cornelia   Allergies  No Known Allergies  History of Present Illness    Paul SENTENOhas a PMH  of shortness of breath, coronary artery disease, hyperlipidemia, CKD, COPD, GERD, hypertension, back pain, and IBS.  He was seen by Dr. Percival Spanish on 04/03/2020 after being referred by his PCP for evaluation of shortness of breath.  He underwent exercise stress test which showed ST depression.  He was sent for coronary CTA.  His screen was negative for AAA.  He was noted to have a coronary calcium score of 271, left main 25-49% stenosis, LAD 25% stenosis, ramus intermediate 60% stenosis, circumflex 25% stenosis and RCA 25% lesion.  He reported that he continues to do well.  He does not have any discomfort associated with chest pain or shortness of breath.  He denied exertional chest discomfort  arm and neck discomfort.  He denied orthopnea and PND.  He was not noted to have weight gain or lower extremity swelling.  He presents to the clinic today for follow-up evaluation states he has noticed increased fatigue over the last 1-2 months.  He continues to try to be physically active playing golf and walking 2-3 times per week.  He has also lost around 10 pounds in the last 1 to 2 months.  We reviewed his most recent lipid panel which shows an LDL cholesterol of 68.  I recommended that we increase his simvastatin and recheck his fasting lipids and 8 weeks.  For his increased fatigue I will order an echocardiogram.  He denies recent sick contacts but does report that  he had COVID infection 1.5-2 years ago and continues to have a intermittent cough.  I will have him continue his heart healthy low-sodium diet and follow-up in 1 to 2 months.  Today he denies chest pain, shortness of breath, lower extremity edema , palpitations, melena, hematuria, hemoptysis, diaphoresis, weakness, presyncope, syncope, orthopnea, and PND.    Home Medications    Prior to Admission medications   Medication Sig Start Date End Date Taking? Authorizing Provider  acyclovir (ZOVIRAX) 400 MG tablet Take 1 tablet (400 mg total) by mouth daily at 6 (six) AM. 08/14/21   Isaac Bliss, Rayford Halsted, MD  indapamide (LOZOL) 1.25 MG tablet Take 1.25 mg by mouth daily.    [provider]  lisinopril (ZESTRIL) 2.5 MG tablet Take 1 tablet (2.5 mg total) by mouth daily. 02/16/22   Isaac Bliss, Rayford Halsted, MD  metFORMIN (GLUCOPHAGE) 500 MG tablet Take 1 tablet (500 mg total) by mouth 2 (two) times daily with a meal. 11/23/21   Isaac Bliss, Rayford Halsted, MD  Multiple Vitamins-Minerals (MULTIVITAMIN ADULT, MINERALS,) TABS Take 1 tablet by mouth 2 (two) times daily.    [provider]  Omega 3-5-6-7-9 Fatty Acids (COMPLETE OMEGA) CAPS Take 1 tablet by mouth daily at 6 (six) AM. Ultimate Omega    [provider]   simvastatin (ZOCOR) 20 MG tablet TAKE 1 TABLET(20 MG) BY MOUTH AT BEDTIME 02/22/22   Isaac Bliss, Rayford Halsted, MD    Family History    Family History  Problem Relation Age of Onset   Liver cancer Mother    Colon polyps Mother 95   Colon cancer Mother 63       mets to other organs   Diabetes Father    Kidney failure Father    Prostate cancer Brother    Esophageal cancer Neg Hx    Rectal cancer Neg Hx    Stomach cancer Neg Hx    He indicated that his mother is deceased. He indicated that his father is deceased. He indicated that his sister  is alive. He indicated that his brother is alive. He indicated that his maternal grandmother is deceased. He indicated that his maternal grandfather is deceased. He indicated that his paternal grandmother is deceased. He indicated that his paternal grandfather is deceased. He indicated that his maternal aunt is deceased. He indicated that his paternal aunt is deceased. He indicated that his paternal uncle is deceased. He indicated that the status of his neg hx is unknown.  Social History    Social History   Socioeconomic History   Marital status: Married    Spouse name: Not on file   Number of children: 2   Years of education: Not on file   Highest education level: Not on file  Occupational History   Occupation: retired from [employer]  Tobacco Use   Smoking status: Some Days    Packs/day: 0.80    Years: 51.00    Total pack years: 40.80    Types: Cigarettes   Smokeless tobacco: Never  Vaping Use   Vaping Use: Never used  Substance and Sexual Activity   Alcohol use: Yes    Alcohol/week: 5.0 standard drinks of alcohol    Types: 5 Standard drinks or equivalent per week   Drug use: No   Sexual activity: Not on file  Other Topics Concern   Not on file  Social History Narrative   Lives with wife and son who has epilepsy.  Retired [employer].     Social Determinants of Health   Financial Resource Strain: Not on file  Food Insecurity: Not on  file  Transportation Needs: Not on file  Physical Activity: Not on file  Stress: Not on file  Social Connections: Not on file  Intimate Partner Violence: Not on file     Review of Systems    General:  No chills, fever, night sweats or weight changes.  Cardiovascular:  No chest pain, dyspnea on exertion, edema, orthopnea, palpitations, paroxysmal nocturnal dyspnea. Dermatological: No rash, lesions/masses Respiratory: No cough, dyspnea Urologic: No hematuria, dysuria Abdominal:   No nausea, vomiting, diarrhea, bright red blood per rectum, melena, or hematemesis Neurologic:  No visual changes, wkns, changes in mental status. All other systems reviewed and are otherwise negative except as noted above.  Physical Exam    VS:  BP 130/68   Pulse 80   Ht '5\' 11"'$  (1.803 m)   Wt 158 lb 3.2 oz (71.8 kg)   SpO2 97%   BMI 22.06 kg/m  , BMI Body mass index is 22.06 kg/m. GEN: Well nourished, well developed, in no acute distress. HEENT: normal. Neck: Supple, no JVD, carotid bruits, or masses. Cardiac: RRR, no murmurs, rubs, or gallops. No clubbing, cyanosis, edema.  Radials/DP/PT 2+ and equal bilaterally.  Respiratory:  Respirations regular and unlabored, clear to auscultation bilaterally. GI: Soft, nontender, nondistended, BS + x 4. MS: no deformity or atrophy. Skin: warm and dry, no rash. Neuro:  Strength and sensation are intact. Psych: Normal affect.  Accessory Clinical Findings    Recent Labs: 08/14/2021: ALT 16; Hemoglobin 15.0; Platelets 244.0 04/23/2022: BUN 29; Creatinine, Ser 1.17; Potassium 4.1; Sodium 136   Recent Lipid Panel    Component Value Date/Time   CHOL 137 08/14/2021 1342   TRIG 103.0 08/14/2021 1342   HDL 48.00 08/14/2021 1342   CHOLHDL 3 08/14/2021 1342   VLDL 20.6 08/14/2021 1342   LDLCALC 68 08/14/2021 1342    ECG personally reviewed by me today-normal sinus rhythm possible left atrial enlargement, possible lateral infarct undetermined age  78 bpm- No  acute changes  Coronary CTA 03/25/2020  FINDINGS: Coronary calcium score is 271, which places the patient in the 49th percentile for age and sex matched control, and is average for what is expected.   Coronary arteries: Normal coronary origins.  Right dominance.   Right Coronary Artery: Minimal mixed atherosclerotic plaque in the ostial RCA, <25% stenosis. Mild mixed atherosclerotic plaque in the proximal-mid RCA, 25-49% stenosis. Minimal mixed atherosclerotic plaque scattered in mid RCA, <25% stenosis. Patent PDA and PLA.   Left Main Coronary Artery: Mild mixed atherosclerotic plaque in the distal left main coronary artery, 25-49% stenosis.   Left Anterior Descending Coronary Artery: Minimal mixed atherosclerotic plaque in the ostial LAD, <25% stenosis.   Ramus intermedius: Moderate mixed atherosclerotic plaque in the proximal vessel, 50-69% stenosis.   Left Circumflex Artery: Acute angle takeoff of the ostial L circumflex artery with possible minimal atherosclerotic plaque and <25% stenosis.   Aorta: Normal size for age when indexed to BSA, 39 mm (R-L) x 38 x 37 mm at sinus of Valsalva, and 34 mm at the mid ascending aorta (level of the PA bifurcation) measured double oblique. Mild mixed atherosclerotic plaque in at the level of sinus of Valsalva and descending thoracic aorta. No dissection.   Aortic Valve: No calcifications.   LVEF approximately 58%   Other findings:   Normal pulmonary vein drainage into the left atrium.   Normal left atrial appendage without a thrombus.   Normal size of the pulmonary artery.   IMPRESSION: 1. Moderate CAD in ramus intermedius, CADRADS = 3. CT FFR will be performed and reported separately.   2. Mild CAD in distal left main coronary artery, proximal to mid RCA, CADRADS 2.   3. Coronary calcium score is 271, which places the patient in the 49th percentile for age and sex matched control, and is average for what is expected.    4. Normal coronary origin with right dominance.   5. Left ventricular ejection fraction calculated at approximately 58%.     Electronically Signed   By: Cherlynn Kaiser   On: 03/26/2020 14:24  Assessment & Plan   1.  Fatigue-increased fatigue over the past 1-2 months.  Continues to try to stay fairly physically active golfing and walking.  Denies irregular heartbeats presyncope and syncope. Order echocardiogram Maintain p.o. hydration  Coronary artery disease-no recent episodes of chest discomfort.  Continues to be very physically active golfing and walking.underwent coronary CTA which showed nonobstructive CAD.  Details above. Continue simvastatin, lisinopril, omega-3 fatty acids Increase simvastatin to 40 mg daily Heart healthy low-sodium diet-salty 6 given Increase physical activity as tolerated  Hyperlipidemia-LDL 68 on 08/14/2021 Continue simvastatin, omega-3 fatty acids Increase simvastatin to 40 mg daily Heart healthy low-sodium high-fiber diet Increase physical activity as tolerated Repeat fasting lipids and LFTs in 8 weeks  Essential hypertension-BP today 130/68.  Well-controlled at home. Continue lisinopril Heart healthy low-sodium diet-salty 6 given Increase physical activity as tolerated  Type 2 diabetes-hemoglobin A1c 6.4 on 02/15/2022 Continue metformin Heart healthy low-sodium diet-salty 6 given Increase physical activity as tolerated Follows with PCP  Disposition: Follow-up with Dr. Percival Spanish or me in 12 months.   Jossie Ng. Madalen Gavin NP-C     04/23/2022, 3:58 PM [facility] [address] Office [phone] Fax [phone]  Notice: This dictation was prepared with Dragon dictation along with smaller phrase technology. Any transcriptional errors that result from this process are unintentional and may not be corrected upon  review.  I spent 14 minutes examining this patient, reviewing medications, and using patient  centered shared decision making involving her cardiac care.  Prior to her visit I spent greater than 20 minutes reviewing her past medical history,  medications, and prior cardiac tests.

## 2022-04-23 NOTE — Patient Instructions (Addendum)
Medication Instructions:  INCREASE ZOCOR '40MG'$  DAILY  *If you need a refill on your cardiac medications before your next appointment, please call your pharmacy*  Lab Work:    FASTING LIPID AND LFT-IN 8 WEEKS       If you have labs (blood work) drawn today and your tests are completely normal, you will receive your results only by: [place] (if you have MyChart) OR  A paper copy in the mail If you have any lab test that is abnormal or we need to change your treatment, we will call you to review the results.  Testing/Procedures:  Echocardiogram - Your physician has requested that you have an echocardiogram. Echocardiography is a painless test that uses sound waves to create images of your heart. It provides your doctor with information about the size and shape of your heart and how well your heart's chambers and valves are working. This procedure takes approximately one hour. There are no restrictions for this procedure.    Special Instructions PLEASE READ AND FOLLOW SALTY 6-ATTACHED-1,'800mg'$  daily  PLEASE INCREASE PHYSICAL ACTIVITY AS TOLERATED   Follow-Up: Your next appointment:  1-2 month(s) In Person with Minus Breeding, MD  or Coletta Memos, FNP   At [facility], you and your health needs are our priority.  As part of our continuing mission to provide you with exceptional heart care, we have created designated Provider Care Teams.  These Care Teams include your primary Cardiologist (physician) and Advanced Practice Providers (APPs -  Physician Assistants and Nurse Practitioners) who all work together to provide you with the care you need, when you need it.  Important Information About Sugar             6 SALTY THINGS TO AVOID     1,'800MG'$  DAILY

## 2022-04-28 ENCOUNTER — Encounter: Payer: Self-pay | Admitting: Internal Medicine

## 2022-05-06 ENCOUNTER — Ambulatory Visit (INDEPENDENT_AMBULATORY_CARE_PROVIDER_SITE_OTHER): Payer: Medicare Other

## 2022-05-06 DIAGNOSIS — R5383 Other fatigue: Secondary | ICD-10-CM

## 2022-05-06 DIAGNOSIS — I2510 Atherosclerotic heart disease of native coronary artery without angina pectoris: Secondary | ICD-10-CM | POA: Diagnosis not present

## 2022-05-06 LAB — ECHOCARDIOGRAM COMPLETE
AR max vel: 1.91 cm2
AV Area VTI: 2.04 cm2
AV Area mean vel: 1.85 cm2
AV Mean grad: 2 mmHg
AV Peak grad: 4.2 mmHg
Ao pk vel: 1.02 m/s
Area-P 1/2: 3.72 cm2
S' Lateral: 3.26 cm

## 2022-05-26 NOTE — Progress Notes (Unsigned)
Cardiology Clinic Note   Patient Name: [name] Date of Encounter: 05/26/2022  Primary Care Provider:  Isaac Bliss, Rayford Halsted, MD Primary Cardiologist:  Minus Breeding, MD  Patient Profile    [name]  81 year old male presents to the clinic today for  evaluation of his coronary artery disease and hyperlipidemia.  Past Medical History    Past Medical History:  Diagnosis Date   Arthritis    generalized   Cataract    BILATERAL-REMOVED   Cigarette smoker    Colon polyp    COPD (chronic obstructive pulmonary disease) (HCC)    Pt reports does not have history of COPD   Coronary artery disease    nonobstructive   Diabetes mellitus without complication ([place])    on meds/diet controlled   Diverticulosis of colon    Gastritis    Hemorrhoids    Hiatal hernia    late 1990's   History of colonic polyps - ? attenuated polyposis 07/27/2007   Adenomas in past, mother w/ probable colon cancer 2004 - none 04/2009 Polyps, multiple (6, max size 8 mm) ADENOMAS 03/2013 - 14m adenoma (1) - 07/02/2019 11 diminutive polyps removed - aden[facility]     History of kidney stones    History of renal calculi    Hyperlipidemia    on meds   IBS (irritable bowel syndrome)    Lumbar back pain    Pre-diabetes    Past Surgical History:  Procedure Laterality Date   ANTERIOR LAT LUMBAR FUSION  08/14/2012   Procedure: ANTERIOR LATERAL LUMBAR FUSION 1 LEVEL;  Surgeon: HCharlie Pitter MD;  [facility];  Service: Neurosurgery;  Laterality: Left;  Left lumbar three-four extreme lumbar interbody fusion with percutaneous pedicle screws   [place]     [place]   COLONOSCOPY  2021   with polyp removed x 3 or 4   COLONOSCOPY W/ BIOPSIES AND POLYPECTOMY  05/06/2009   adenomous polyp, diverticulosis, external hemorrhoids   CYSTOSCOPY  11/03   TURP and stone removal - Dr EAmalia Hailey  CYSTOSCOPY  WITH RETROGRADE PYELOGRAM, URETEROSCOPY AND STENT PLACEMENT Bilateral 06/20/2020   Procedure: CYSTOSCOPY WITH BILATERAL  RETROGRADE PYELOGRAM, LEFT URETEROSCOPY AND STENT PLACEMENT;  Surgeon: MAlexis Frock MD;  [facility]  Service: Urology;  Laterality: Bilateral;  75 MINS   dental implants  2007   EYE SURGERY Bilateral 2019   cataracts both eyes   HOLMIUM LASER APPLICATION Left 97/05/2955  Procedure: HOLMIUM LASER APPLICATION;  Surgeon: MAlexis Frock MD;  [facility]  Service: Urology;  Laterality: Left;   LITHOTRIPSY     x 3   LUMBAR MICRODISCECTOMY  12/07   L3-4 by Dr PAnnette Stable  LUMBAR PERCUTANEOUS PEDICLE SCREW 1 LEVEL  08/14/2012   Procedure: LUMBAR PERCUTANEOUS PEDICLE SCREW 1 LEVEL;  Surgeon: HCharlie Pitter MD;  [place] [facility];  Service: Neurosurgery;  Laterality: Left;  Left lumbar three-four extreme lumbar interbody fusion with percutaneous pedicle screws    ROTATOR CUFF REPAIR     left shoulder 2014, right shoulder 2018   UPPER GASTROINTESTINAL ENDOSCOPY  09/07/2010   erosive gastritis, GERD   UVULOPALATOPHARYNGOPLASTY  2001   Dr SWilburn Cornelia   Allergies  No Known Allergies  History of Present Illness    Paul RAWLEShas a PMH  of shortness of breath, coronary artery disease, hyperlipidemia, CKD, COPD, GERD, hypertension, back pain, and IBS.  He was seen by Dr. Percival Spanish on 04/03/2020 after being referred by his PCP for evaluation of shortness of breath.  He underwent exercise stress test which showed ST depression.  He was sent for coronary CTA.  His screen was negative for AAA.  He was noted to have a coronary calcium score of 271, left main 25-49% stenosis, LAD 25% stenosis, ramus intermediate 60% stenosis, circumflex 25% stenosis and RCA 25% lesion.  He reported that he continues to do well.  He does not have any discomfort associated with chest pain or shortness of breath.  He denied exertional chest discomfort  arm and neck discomfort.  He denied orthopnea and PND.  He was not noted to have weight gain or lower extremity swelling.  He presented to the clinic 04/23/2022 for follow-up evaluation stated he had noticed increased fatigue over the last 1-2 months.  He continued to try to be physically active playing golf and walking 2-3 times per week.  He had also lost around 10 pounds in the last 1 to 2 months.  We reviewed his most recent lipid panel which shows an LDL cholesterol of 68.  I recommended that we increase his simvastatin and recheck his fasting lipids and 8 weeks.  For his increased fatigue I will ordered an echocardiogram.  He denies recent sick contacts but does report that  he had COVID infection 1.5-2 years ago and continued to have a intermittent cough.  His echocardiogram 05/06/2022 showed normal LVEF and no significant valvular abnormalities.  He presents to the clinic today for follow-up evaluation and states***  Today he denies chest pain, shortness of breath, lower extremity edema , palpitations, melena, hematuria, hemoptysis, diaphoresis, weakness, presyncope, syncope, orthopnea, and PND.    Home Medications    Prior to Admission medications   Medication Sig Start Date End Date Taking? Authorizing Provider  acyclovir (ZOVIRAX) 400 MG tablet Take 1 tablet (400 mg total) by mouth daily at 6 (six) AM. 08/14/21   Isaac Bliss, Rayford Halsted, MD  indapamide (LOZOL) 1.25 MG tablet Take 1.25 mg by mouth daily.    [provider]  lisinopril (ZESTRIL) 2.5 MG tablet Take 1 tablet (2.5 mg total) by mouth daily. 02/16/22   Isaac Bliss, Rayford Halsted, MD  metFORMIN (GLUCOPHAGE) 500 MG tablet Take 1 tablet (500 mg total) by mouth 2 (two) times daily with a meal. 11/23/21   Isaac Bliss, Rayford Halsted, MD  Multiple Vitamins-Minerals (MULTIVITAMIN ADULT, MINERALS,) TABS Take 1 tablet by mouth 2 (two) times daily.    [provider]  Omega 3-5-6-7-9 Fatty Acids (COMPLETE OMEGA) CAPS Take 1  tablet by mouth daily at 6 (six) AM. Ultimate Omega    [provider]  simvastatin (ZOCOR) 20 MG tablet TAKE 1 TABLET(20 MG) BY MOUTH AT BEDTIME 02/22/22   Isaac Bliss, Rayford Halsted, MD    Family History    Family History  Problem Relation Age of Onset   Liver cancer Mother    Colon polyps Mother 59   Colon cancer Mother 44       mets to other organs   Diabetes Father    Kidney failure Father    Prostate cancer Brother    Esophageal cancer Neg Hx    Rectal cancer Neg Hx    Stomach cancer Neg Hx    He indicated that his mother is deceased. He indicated that his father is  deceased. He indicated that his sister is alive. He indicated that his brother is alive. He indicated that his maternal grandmother is deceased. He indicated that his maternal grandfather is deceased. He indicated that his paternal grandmother is deceased. He indicated that his paternal grandfather is deceased. He indicated that his maternal aunt is deceased. He indicated that his paternal aunt is deceased. He indicated that his paternal uncle is deceased. He indicated that the status of his neg hx is unknown.  Social History    Social History   Socioeconomic History   Marital status: Married    Spouse name: Not on file   Number of children: 2   Years of education: Not on file   Highest education level: Not on file  Occupational History   Occupation: retired from [employer]  Tobacco Use   Smoking status: Some Days    Packs/day: 0.80    Years: 51.00    Total pack years: 40.80    Types: Cigarettes   Smokeless tobacco: Never  Vaping Use   Vaping Use: Never used  Substance and Sexual Activity   Alcohol use: Yes    Alcohol/week: 5.0 standard drinks of alcohol    Types: 5 Standard drinks or equivalent per week   Drug use: No   Sexual activity: Not on file  Other Topics Concern   Not on file  Social History Narrative   Lives with wife and son who has epilepsy.  Retired [employer].     Social Determinants of  Health   Financial Resource Strain: Not on file  Food Insecurity: Not on file  Transportation Needs: Not on file  Physical Activity: Not on file  Stress: Not on file  Social Connections: Not on file  Intimate Partner Violence: Not on file     Review of Systems    General:  No chills, fever, night sweats or weight changes.  Cardiovascular:  No chest pain, dyspnea on exertion, edema, orthopnea, palpitations, paroxysmal nocturnal dyspnea. Dermatological: No rash, lesions/masses Respiratory: No cough, dyspnea Urologic: No hematuria, dysuria Abdominal:   No nausea, vomiting, diarrhea, bright red blood per rectum, melena, or hematemesis Neurologic:  No visual changes, wkns, changes in mental status. All other systems reviewed and are otherwise negative except as noted above.  Physical Exam    VS:  There were no vitals taken for this visit. , BMI There is no height or weight on file to calculate BMI. GEN: Well nourished, well developed, in no acute distress. HEENT: normal. Neck: Supple, no JVD, carotid bruits, or masses. Cardiac: RRR, no murmurs, rubs, or gallops. No clubbing, cyanosis, edema.  Radials/DP/PT 2+ and equal bilaterally.  Respiratory:  Respirations regular and unlabored, clear to auscultation bilaterally. GI: Soft, nontender, nondistended, BS + x 4. MS: no deformity or atrophy. Skin: warm and dry, no rash. Neuro:  Strength and sensation are intact. Psych: Normal affect.  Accessory Clinical Findings    Recent Labs: 08/14/2021: ALT 16; Hemoglobin 15.0; Platelets 244.0 04/23/2022: BUN 29; Creatinine, Ser 1.17; Potassium 4.1; Sodium 136   Recent Lipid Panel    Component Value Date/Time   CHOL 137 08/14/2021 1342   TRIG 103.0 08/14/2021 1342   HDL 48.00 08/14/2021 1342   CHOLHDL 3 08/14/2021 1342   VLDL 20.6 08/14/2021 1342   LDLCALC 68 08/14/2021 1342    ECG personally reviewed by me today-none today.  EKG 04/23/2022 normal sinus rhythm possible left atrial  enlargement, possible lateral infarct undetermined age 47 bpm- No acute changes  Coronary  CTA 03/25/2020  FINDINGS: Coronary calcium score is 271, which places the patient in the 49th percentile for age and sex matched control, and is average for what is expected.   Coronary arteries: Normal coronary origins.  Right dominance.   Right Coronary Artery: Minimal mixed atherosclerotic plaque in the ostial RCA, <25% stenosis. Mild mixed atherosclerotic plaque in the proximal-mid RCA, 25-49% stenosis. Minimal mixed atherosclerotic plaque scattered in mid RCA, <25% stenosis. Patent PDA and PLA.   Left Main Coronary Artery: Mild mixed atherosclerotic plaque in the distal left main coronary artery, 25-49% stenosis.   Left Anterior Descending Coronary Artery: Minimal mixed atherosclerotic plaque in the ostial LAD, <25% stenosis.   Ramus intermedius: Moderate mixed atherosclerotic plaque in the proximal vessel, 50-69% stenosis.   Left Circumflex Artery: Acute angle takeoff of the ostial L circumflex artery with possible minimal atherosclerotic plaque and <25% stenosis.   Aorta: Normal size for age when indexed to BSA, 39 mm (R-L) x 38 x 37 mm at sinus of Valsalva, and 34 mm at the mid ascending aorta (level of the PA bifurcation) measured double oblique. Mild mixed atherosclerotic plaque in at the level of sinus of Valsalva and descending thoracic aorta. No dissection.   Aortic Valve: No calcifications.   LVEF approximately 58%   Other findings:   Normal pulmonary vein drainage into the left atrium.   Normal left atrial appendage without a thrombus.   Normal size of the pulmonary artery.   IMPRESSION: 1. Moderate CAD in ramus intermedius, CADRADS = 3. CT FFR will be performed and reported separately.   2. Mild CAD in distal left main coronary artery, proximal to mid RCA, CADRADS 2.   3. Coronary calcium score is 271, which places the patient in the 49th percentile for age  and sex matched control, and is average for what is expected.   4. Normal coronary origin with right dominance.   5. Left ventricular ejection fraction calculated at approximately 58%.     Electronically Signed   By: Cherlynn Kaiser   On: 03/26/2020 14:24  Echocardiogram 05/06/2022 IMPRESSIONS     1. Left ventricular ejection fraction by 3D volume is 55 %. The left  ventricle has normal function. The left ventricle has no regional wall  motion abnormalities. Left ventricular diastolic parameters were normal.   2. Right ventricular systolic function is normal. The right ventricular  size is normal. Tricuspid regurgitation signal is inadequate for assessing  PA pressure.   3. The mitral valve is normal in structure. No evidence of mitral valve  regurgitation. No evidence of mitral stenosis.   4. The aortic valve is normal in structure. Aortic valve regurgitation is  not visualized. No aortic stenosis is present.   5. The inferior vena cava is normal in size with greater than 50%  respiratory variability, suggesting right atrial pressure of 3 mmHg.   Comparison(s): No prior Echocardiogram.   Assessment & Plan   1.  Fatigue-increased fatigue over the past 1-2 months.  Continues to try to stay fairly physically active golfing and walking.  Continues to deny irregular heartbeats presyncope and syncope.  Echocardiogram 05/06/2022 showed normal pumping function and no significant valvular abnormalities.  Details above. Reassured that his fatigue was not related to cardiac issues. Increase physical activity as tolerated  Coronary artery disease-no chest pain today.  Continues to golf and walk regularly.  Underwent coronary CTA which showed nonobstructive CAD.  Details above. Continue simvastatin, lisinopril, omega-3 fatty acids Heart healthy low-sodium diet-salty 6  given Increase physical activity as tolerated  Essential hypertension-BP today 130/68.  Well-controlled at  home. Continue lisinopril Heart healthy low-sodium diet-salty 6 reviewed Increase physical activity as tolerated  Hyperlipidemia-LDL 68 on 08/14/2021 Continue simvastatin, omega-3 fatty acids Increase simvastatin to 40 mg daily Heart healthy low-sodium high-fiber diet Increase physical activity as tolerated Repeat fasting lipids and LFTs    Disposition: Follow-up with Dr. Percival Spanish or me in 12 months.   Jossie Ng. Radiah Lubinski NP-C     05/26/2022, 6:38 AM [place] [address] Office [phone] Fax [phone]  Notice: This dictation was prepared with Dragon dictation along with smaller phrase technology. Any transcriptional errors that result from this process are unintentional and may not be corrected upon review.  I spent 14*** minutes examining this patient, reviewing medications, and using patient centered shared decision making involving her cardiac care.  Prior to her visit I spent greater than 20 minutes reviewing her past medical history,  medications, and prior cardiac tests.

## 2022-05-27 ENCOUNTER — Ambulatory Visit (INDEPENDENT_AMBULATORY_CARE_PROVIDER_SITE_OTHER): Payer: Medicare Other | Admitting: General Practice

## 2022-05-27 ENCOUNTER — Encounter: Payer: Self-pay | Admitting: General Practice

## 2022-05-27 VITALS — BP 120/62 | HR 72 | Ht 71.0 in | Wt 160.8 lb

## 2022-05-27 DIAGNOSIS — E782 Mixed hyperlipidemia: Secondary | ICD-10-CM

## 2022-05-27 DIAGNOSIS — I10 Essential (primary) hypertension: Secondary | ICD-10-CM

## 2022-05-27 DIAGNOSIS — R5383 Other fatigue: Secondary | ICD-10-CM | POA: Diagnosis not present

## 2022-05-27 DIAGNOSIS — I2510 Atherosclerotic heart disease of native coronary artery without angina pectoris: Secondary | ICD-10-CM

## 2022-05-27 NOTE — Patient Instructions (Signed)
Medication Instructions:  The current medical regimen is effective;  continue present plan and medications as directed. Please refer to the Current Medication list given to you today.   *If you need a refill on your cardiac medications before your next appointment, please call your pharmacy*  Lab Work:   Testing/Procedures:  NONE    NONE If you have labs (blood work) drawn today and your tests are completely normal, you will receive your results only by: [place] (if you have MyChart) OR  A paper copy in the mail If you have any lab test that is abnormal or we need to change your treatment, we will call you to review the results.  You may also go to any of these [facility] locations:  [place] [address],  [place] [address] ([facility])  [address]  [place] [address] On[facility])  Special Instructions MAINTAIN PHYSICAL ACTIVITY-AS TOLERATED  Follow-Up: Your next appointment:  12 month(s) In Person with Minus Breeding, MD   Please call our office 2 months in advance to schedule this appointment   At [facility], you and your health needs are our priority.  As part of our continuing mission to provide you with exceptional heart care, we have created designated Provider Care Teams.  These Care Teams include your primary Cardiologist (physician) and Advanced Practice Providers (APPs -  Physician Assistants and Nurse Practitioners) who all work together to provide you with the care you need, when you need it.  Important Information About Sugar

## 2022-06-11 ENCOUNTER — Other Ambulatory Visit: Payer: Self-pay | Admitting: Internal Medicine

## 2022-06-11 DIAGNOSIS — E119 Type 2 diabetes mellitus without complications: Secondary | ICD-10-CM

## 2022-06-30 DIAGNOSIS — H5211 Myopia, right eye: Secondary | ICD-10-CM | POA: Diagnosis not present

## 2022-06-30 DIAGNOSIS — H524 Presbyopia: Secondary | ICD-10-CM | POA: Diagnosis not present

## 2022-06-30 DIAGNOSIS — E119 Type 2 diabetes mellitus without complications: Secondary | ICD-10-CM | POA: Diagnosis not present

## 2022-06-30 LAB — HM DIABETES EYE EXAM

## 2022-07-15 DIAGNOSIS — D485 Neoplasm of uncertain behavior of skin: Secondary | ICD-10-CM | POA: Diagnosis not present

## 2022-07-15 DIAGNOSIS — L820 Inflamed seborrheic keratosis: Secondary | ICD-10-CM | POA: Diagnosis not present

## 2022-07-15 DIAGNOSIS — L570 Actinic keratosis: Secondary | ICD-10-CM | POA: Diagnosis not present

## 2022-07-15 DIAGNOSIS — L578 Other skin changes due to chronic exposure to nonionizing radiation: Secondary | ICD-10-CM | POA: Diagnosis not present

## 2022-07-16 ENCOUNTER — Other Ambulatory Visit (HOSPITAL_BASED_OUTPATIENT_CLINIC_OR_DEPARTMENT_OTHER): Payer: Self-pay

## 2022-07-16 DIAGNOSIS — Z23 Encounter for immunization: Secondary | ICD-10-CM | POA: Diagnosis not present

## 2022-07-16 MED ORDER — AREXVY 120 MCG/0.5ML IM SUSR
INTRAMUSCULAR | 0 refills | Status: DC
  Filled 2022-07-16: qty 0.5, 1d supply, fill #0

## 2022-07-16 MED ORDER — INFLUENZA VAC A&B SA ADJ QUAD 0.5 ML IM PRSY
PREFILLED_SYRINGE | INTRAMUSCULAR | 0 refills | Status: DC
  Filled 2022-07-16: qty 0.5, 1d supply, fill #0

## 2022-08-13 ENCOUNTER — Other Ambulatory Visit: Payer: Self-pay | Admitting: Internal Medicine

## 2022-08-19 ENCOUNTER — Ambulatory Visit (INDEPENDENT_AMBULATORY_CARE_PROVIDER_SITE_OTHER): Payer: Medicare Other | Admitting: Internal Medicine

## 2022-08-19 ENCOUNTER — Encounter: Payer: Self-pay | Admitting: Internal Medicine

## 2022-08-19 VITALS — BP 120/70 | HR 70 | Temp 98.5°F | Wt 168.3 lb

## 2022-08-19 DIAGNOSIS — I10 Essential (primary) hypertension: Secondary | ICD-10-CM | POA: Diagnosis not present

## 2022-08-19 DIAGNOSIS — E785 Hyperlipidemia, unspecified: Secondary | ICD-10-CM | POA: Diagnosis not present

## 2022-08-19 DIAGNOSIS — E119 Type 2 diabetes mellitus without complications: Secondary | ICD-10-CM

## 2022-08-19 DIAGNOSIS — E1169 Type 2 diabetes mellitus with other specified complication: Secondary | ICD-10-CM | POA: Diagnosis not present

## 2022-08-19 DIAGNOSIS — I2510 Atherosclerotic heart disease of native coronary artery without angina pectoris: Secondary | ICD-10-CM

## 2022-08-19 LAB — LIPID PANEL
Cholesterol: 119 mg/dL (ref 0–200)
HDL: 52.3 mg/dL (ref >39.00)
LDL Cholesterol: 46 mg/dL (ref 0–99)
NonHDL: 67.18
Total CHOL/HDL Ratio: 2
Triglycerides: 108 mg/dL (ref 0.0–149.0)
VLDL: 21.6 mg/dL (ref 0.0–40.0)

## 2022-08-19 LAB — COMPREHENSIVE METABOLIC PANEL
ALT: 14 U/L (ref 0–53)
AST: 15 U/L (ref 0–37)
Albumin: 4.5 g/dL (ref 3.5–5.2)
Alkaline Phosphatase: 60 U/L (ref 39–117)
BUN: 20 mg/dL (ref 6–23)
CO2: 31 mEq/L (ref 19–32)
Calcium: 9.9 mg/dL (ref 8.4–10.5)
Chloride: 99 mEq/L (ref 96–112)
Creatinine, Ser: 1.14 mg/dL (ref 0.40–1.50)
GFR: 61.43 mL/min (ref >60.00)
Glucose, Bld: 154 mg/dL — ABNORMAL HIGH (ref 70–99)
Potassium: 4.7 mEq/L (ref 3.5–5.1)
Sodium: 138 mEq/L (ref 135–145)
Total Bilirubin: 0.6 mg/dL (ref 0.2–1.2)
Total Protein: 6.8 g/dL (ref 6.0–8.3)

## 2022-08-19 LAB — POCT GLYCOSYLATED HEMOGLOBIN (HGB A1C): Hemoglobin A1C: 6.3 % — AB (ref 4.0–5.6)

## 2022-08-19 LAB — CBC WITH DIFFERENTIAL/PLATELET
Basophils Absolute: 0 10*3/uL (ref 0.0–0.1)
Basophils Relative: 0.7 % (ref 0.0–3.0)
Eosinophils Absolute: 0.2 10*3/uL (ref 0.0–0.7)
Eosinophils Relative: 3.2 % (ref 0.0–5.0)
HCT: 43.7 % (ref 39.0–52.0)
Hemoglobin: 14.6 g/dL (ref 13.0–17.0)
Lymphocytes Relative: 25.4 % (ref 12.0–46.0)
Lymphs Abs: 1.5 10*3/uL (ref 0.7–4.0)
MCHC: 33.5 g/dL (ref 30.0–36.0)
MCV: 93 fl (ref 78.0–100.0)
Monocytes Absolute: 0.5 10*3/uL (ref 0.1–1.0)
Monocytes Relative: 8.7 % (ref 3.0–12.0)
Neutro Abs: 3.8 10*3/uL (ref 1.4–7.7)
Neutrophils Relative %: 62 % (ref 43.0–77.0)
Platelets: 246 10*3/uL (ref 150.0–400.0)
RBC: 4.69 Mil/uL (ref 4.22–5.81)
RDW: 12.9 % (ref 11.5–15.5)
WBC: 6.1 10*3/uL (ref 4.0–10.5)

## 2022-08-19 NOTE — Progress Notes (Signed)
Established Patient Office Visit     CC/Reason for Visit: Follow-up chronic conditions  HPI: [name] is a 81 y.o. male who is coming in today for the above mentioned reasons. Past Medical History is significant for: Hypertension, hyperlipidemia, type 2 diabetes.  He is feeling well today and has no concerns other than difficulty hearing for which she is requesting referral to audiology.  He had flu and RSV vaccines recently, he will be going back to the pharmacy soon for his COVID vaccination.   Past Medical/Surgical History: Past Medical History:  Diagnosis Date   Arthritis    generalized   Cataract    BILATERAL-REMOVED   Cigarette smoker    Colon polyp    COPD (chronic obstructive pulmonary disease) (HCC)    Pt reports does not have history of COPD   Coronary artery disease    nonobstructive   Diabetes mellitus without complication (HCC)    on meds/diet controlled   Diverticulosis of colon    Gastritis    Hemorrhoids    Hiatal hernia    late 1990's   History of colonic polyps - ? attenuated polyposis 07/27/2007   Adenomas in past, mother w/ probable colon cancer 2004 - none 04/2009 Polyps, multiple (6, max size 8 mm) ADENOMAS 03/2013 - 23m adenoma (1) - 07/02/2019 11 diminutive polyps removed - adenomas [facility]atha Mayer [facility], [facility]     History of kidney stones    History of renal calculi    Hyperlipidemia    on meds   IBS (irritable bowel syndrome)    Lumbar back pain    Pre-diabetes     Past Surgical History:  Procedure Laterality Date   ANTERIOR LAT LUMBAR FUSION  08/14/2012   Procedure: ANTERIOR LATERAL LUMBAR FUSION 1 LEVEL;  Surgeon: HCharlie Pitter MD;  [place] [facility] [place];  Service: Neurosurgery;  Laterality: Left;  Left lumbar three-four extreme lumbar interbody fusion with percutaneous pedicle screws   BACK SURGERY     x [place]   [place]   COLONOSCOPY  2021   with polyp removed x 3 or 4    COLONOSCOPY W/ BIOPSIES AND POLYPECTOMY  05/06/2009   adenomous polyp, diverticulosis, external hemorrhoids   CYSTOSCOPY  11/03   TURP and stone removal - Dr EAmalia Hailey  CYSTOSCOPY WITH RETROGRADE PYELOGRAM, URETEROSCOPY AND STENT PLACEMENT Bilateral 06/20/2020   Procedure: CYSTOSCOPY WITH BILATERAL  RETROGRADE PYELOGRAM, LEFT URETEROSCOPY AND STENT PLACEMENT;  Surgeon: MAlexis Frock MD;  [facility]  Service: Urology;  Laterality: Bilateral;  75 MINS   dental implants  2007   EYE SURGERY Bilateral 2019   cataracts both eyes   HOLMIUM LASER APPLICATION Left 90/10/7492  Procedure: HOLMIUM LASER APPLICATION;  Surgeon: MAlexis Frock MD;  [facility]  Service: Urology;  Laterality: Left;   LITHOTRIPSY     x 3   LUMBAR MICRODISCECTOMY  12/07   L3-4 by Dr PAnnette Stable  LUMBAR PERCUTANEOUS PEDICLE SCREW 1 LEVEL  08/14/2012   Procedure: LUMBAR PERCUTANEOUS PEDICLE SCREW 1 LEVEL;  Surgeon: HCharlie Pitter MD;  [place] [facility];  Service: Neurosurgery;  Laterality: Left;  Left lumbar three-four extreme lumbar interbody fusion with percutaneous pedicle screws    ROTATOR CUFF REPAIR     left shoulder 2014, right shoulder 2018   UPPER GASTROINTESTINAL ENDOSCOPY  09/07/2010   erosive gastritis, GERD  UVULOPALATOPHARYNGOPLASTY  2001   Dr Wilburn Cornelia    Social History:  reports that he has been smoking cigarettes. He has a 40.80 pack-year smoking history. He has never used smokeless tobacco. He reports current alcohol use of about 5.0 standard drinks of alcohol per week. He reports that he does not use drugs.  Allergies: No Known Allergies  Family History:  Family History  Problem Relation Age of Onset   Liver cancer Mother    Colon polyps Mother 59   Colon cancer Mother 83       mets to other organs   Diabetes Father    Kidney failure Father    Prostate cancer Brother    Esophageal cancer Neg Hx    Rectal cancer Neg Hx    Stomach cancer  Neg Hx      Current Outpatient Medications:    indapamide (LOZOL) 1.25 MG tablet, Take 1.25 mg by mouth daily., Disp: , Rfl:    influenza vaccine adjuvanted (FLUAD) 0.5 ML injection, Inject into the muscle., Disp: 0.5 mL, Rfl: 0   lisinopril (ZESTRIL) 2.5 MG tablet, TAKE 1 TABLET(2.5 MG) BY MOUTH DAILY, Disp: 90 tablet, Rfl: 1   metFORMIN (GLUCOPHAGE) 500 MG tablet, TAKE 1 TABLET(500 MG) BY MOUTH TWICE DAILY WITH A MEAL, Disp: 180 tablet, Rfl: 0   Multiple Vitamins-Minerals (MULTIVITAMIN ADULT, MINERALS,) TABS, Take 1 tablet by mouth 2 (two) times daily., Disp: , Rfl:    Omega 3-5-6-7-9 Fatty Acids (COMPLETE OMEGA) CAPS, Take 1 tablet by mouth daily at 6 (six) AM. Ultimate Omega, Disp: , Rfl:    RSV vaccine recomb adjuvanted (AREXVY) 120 MCG/0.5ML injection, Inject into the muscle., Disp: 0.5 mL, Rfl: 0   simvastatin (ZOCOR) 40 MG tablet, One tablet by mouth daily, Disp: 30 tablet, Rfl: 6  Review of Systems:  Constitutional: Denies fever, chills, diaphoresis, appetite change and fatigue.  HEENT: Denies photophobia, eye pain, redness, hearing loss, ear pain, congestion, sore throat, rhinorrhea, sneezing, mouth sores, trouble swallowing, neck pain, neck stiffness and tinnitus.   Respiratory: Denies SOB, DOE, cough, chest tightness,  and wheezing.   Cardiovascular: Denies chest pain, palpitations and leg swelling.  Gastrointestinal: Denies nausea, vomiting, abdominal pain, diarrhea, constipation, blood in stool and abdominal distention.  Genitourinary: Denies dysuria, urgency, frequency, hematuria, flank pain and difficulty urinating.  Endocrine: Denies: hot or cold intolerance, sweats, changes in hair or nails, polyuria, polydipsia. Musculoskeletal: Denies myalgias, back pain, joint swelling, arthralgias and gait problem.  Skin: Denies pallor, rash and wound.  Neurological: Denies dizziness, seizures, syncope, weakness, light-headedness, numbness and headaches.  Hematological: Denies  adenopathy. Easy bruising, personal or family bleeding history  Psychiatric/Behavioral: Denies suicidal ideation, mood changes, confusion, nervousness, sleep disturbance and agitation    Physical Exam: Vitals:   08/19/22 0856  BP: 120/70  Pulse: 70  Temp: 98.5 F (36.9 C)  TempSrc: Oral  SpO2: 98%  Weight: 168 lb 4.8 oz (76.3 kg)    Body mass index is 23.47 kg/m.   Constitutional: NAD, calm, comfortable Eyes: PERRL, lids and conjunctivae normal, wears corrective lenses ENMT: Mucous membranes are moist.  Respiratory: clear to auscultation bilaterally, no wheezing, no crackles. Normal respiratory effort. No accessory muscle use.  Cardiovascular: Regular rate and rhythm, no murmurs / rubs / gallops. No extremity edema.   Psychiatric: Normal judgment and insight. Alert and oriented x 3. Normal mood.    Impression and Plan:  Diabetes mellitus without complication ([place]) - Plan: POCT glycosylated hemoglobin (Hb A1C)  Essential hypertension - Plan: CBC  with Differential/Platelet, Comprehensive metabolic panel  Hyperlipidemia associated with type 2 diabetes mellitus ([place]) - Plan: Lipid panel  -Blood pressure is well controlled on lisinopril. -A1c of 6.3 demonstrates excellent diabetic control. -He is having difficulty hearing, referral to audiology had been placed previously, number given for him to call for appointment. -He is overdue for lipid check, labs ordered today.   Time spent:32 minutes reviewing chart, interviewing and examining patient and formulating plan of care.       Lelon Frohlich, MD [facility]

## 2022-08-23 DIAGNOSIS — N281 Cyst of kidney, acquired: Secondary | ICD-10-CM | POA: Diagnosis not present

## 2022-08-23 DIAGNOSIS — N5201 Erectile dysfunction due to arterial insufficiency: Secondary | ICD-10-CM | POA: Diagnosis not present

## 2022-08-23 DIAGNOSIS — N202 Calculus of kidney with calculus of ureter: Secondary | ICD-10-CM | POA: Diagnosis not present

## 2022-08-24 ENCOUNTER — Encounter: Payer: Self-pay | Admitting: *Deleted

## 2022-08-25 ENCOUNTER — Encounter: Payer: Self-pay | Admitting: Internal Medicine

## 2022-08-25 DIAGNOSIS — H9190 Unspecified hearing loss, unspecified ear: Secondary | ICD-10-CM

## 2022-08-25 NOTE — Telephone Encounter (Signed)
Okay for referral?

## 2022-09-01 ENCOUNTER — Other Ambulatory Visit (HOSPITAL_BASED_OUTPATIENT_CLINIC_OR_DEPARTMENT_OTHER): Payer: Self-pay

## 2022-09-01 DIAGNOSIS — Z23 Encounter for immunization: Secondary | ICD-10-CM | POA: Diagnosis not present

## 2022-09-01 MED ORDER — COMIRNATY 30 MCG/0.3ML IM SUSY
PREFILLED_SYRINGE | INTRAMUSCULAR | 0 refills | Status: DC
  Filled 2022-09-01: qty 0.3, 1d supply, fill #0

## 2022-09-13 ENCOUNTER — Ambulatory Visit: Payer: Medicare Other | Attending: Audiologist | Admitting: Audiologist

## 2022-09-13 ENCOUNTER — Other Ambulatory Visit: Payer: Self-pay | Admitting: Internal Medicine

## 2022-09-13 DIAGNOSIS — H903 Sensorineural hearing loss, bilateral: Secondary | ICD-10-CM | POA: Insufficient documentation

## 2022-09-13 DIAGNOSIS — E119 Type 2 diabetes mellitus without complications: Secondary | ICD-10-CM

## 2022-09-13 NOTE — Procedures (Signed)
  Outpatient [facility] and [place], [place]  [address] [phone]  AUDIOLOGICAL  EVALUATION  NAME: [name]     DOB:   September 12, 1941      MRN: [id number]                                                                                     DATE: 09/13/2022     REFERENT: Isaac Bliss, Rayford Halsted, MD STATUS: Outpatient DIAGNOSIS: Sensorineural Hearing Loss Bilateral    History: [name] was seen for an audiological evaluation. [name] was previously seen in this office last year. Pure tone thresholds showed normal sloping after 2k Hz to a moderately severe sensorineural hearing loss in both ears.  Word Recognition was  excellent at conversation and an elevated level.  [name] has difficulty hearing in background noise, crowds, and when people are at a distance or have their back turned. This difficulty began gradually. No pain or pressure reported in either ear. Tinnitus denied for both ears. 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  Medical history negative for a condition which is a risk factor for hearing loss. Diabetes is well controlled per patient report. No other relevant case history reported.   Evaluation:  Otoscopy showed a clear view of the tympanic membranes, bilaterally Tympanometry results were consistent with hypercompliance bilaterally   Audiometric testing was completed using conventional audiometry with supraural transducer. Speech Recognition Thresholds were  30dB in the right ear and 25 dB in the left ear. Word Recognition was  performed 40dB SL, scored  92% in the right ear and 92% in the left ear. Pure tone thresholds show normal sloping to severe sensorineural hearing loss in each ear. QuickSIN 6.5dB SNR showing mild difficulty in noise.   Results:  The test results were reviewed with [name]. His word recognition has deteriorated compared to his 2022 evaluation. Hearing loss is still normal sloping to  severe sensorineural hearing loss bilaterally. Recommend he try hearing aids for both ears. He agreed he is ready to try them.   Recommendations: Amplification is necessary for both ears. Hearing aids can be purchased from a variety of locations. See provided list for locations in the [place] area.    43 minutes spent testing and counseling on results.   Alfonse Alpers  Audiologist, Au.D., CCC-A 09/13/2022  9:43 AM  Cc: Isaac Bliss, Rayford Halsted, MD

## 2022-10-28 DIAGNOSIS — L309 Dermatitis, unspecified: Secondary | ICD-10-CM | POA: Diagnosis not present

## 2022-11-09 ENCOUNTER — Telehealth: Payer: Self-pay | Admitting: *Deleted

## 2022-11-09 ENCOUNTER — Encounter: Payer: Self-pay | Admitting: *Deleted

## 2022-11-09 NOTE — Patient Instructions (Signed)
Visit Information  Thank you for taking time to visit with me today. Please don't hesitate to contact me if I can be of assistance to you.   Following are the goals we discussed today:   Goals Addressed             This Visit's Progress    COMPLETED: care coordination activity       Care Coordination Interventions: Reviewed medications with patient and discussed adherence with no needed refills Reviewed scheduled/upcoming provider appointments including sufficient transportation source Assessed social determinant of health barriers Educated on care management services with no needs presented at this time.        Please call the care guide team at [phone] if you need to cancel or reschedule your appointment.   If you are experiencing a Mental Health or Behavioral Health Crisis or need someone to talk to, please call the Suicide and Crisis Lifeline: 988  Patient verbalizes understanding of instructions and care plan provided today and agrees to view in MyChart. Active MyChart status and patient understanding of how to access instructions and care plan via MyChart confirmed with patient.     No further follow up required: No needs  Jakory Matsuo, RN Care Management Coordinator [facility] Main Office [phone]    

## 2022-11-09 NOTE — Patient Outreach (Signed)
  Care Coordination   Initial Visit Note   11/09/2022 Name: [name] MRN: [id number] DOB: 11/18/1940  [name] is a 82 y.o. year old male who sees Isaac Bliss, Rayford Halsted, MD for primary care. I spoke with  Suezanne Jacquet by phone today.  What matters to the patients health and wellness today?  No needs    Goals Addressed             This Visit's Progress    COMPLETED: care coordination activity       Care Coordination Interventions: Reviewed medications with patient and discussed adherence with no needed refills Reviewed scheduled/upcoming provider appointments including sufficient transportation source Assessed social determinant of health barriers Educated on care management services with no needs presented at this time.         SDOH assessments and interventions completed:  Yes  SDOH Interventions Today    Flowsheet Row Most Recent Value  SDOH Interventions   Food Insecurity Interventions Intervention Not Indicated  Housing Interventions Intervention Not Indicated  Transportation Interventions Intervention Not Indicated  Utilities Interventions Intervention Not Indicated        Care Coordination Interventions:  Yes, provided   Follow up plan: No further intervention required.   Encounter Outcome:  Pt. Visit Completed   Raina Mina, RN Care Management Coordinator [place] Office [phone]

## 2022-11-25 ENCOUNTER — Other Ambulatory Visit: Payer: Self-pay | Admitting: General Practice

## 2022-11-25 DIAGNOSIS — E785 Hyperlipidemia, unspecified: Secondary | ICD-10-CM

## 2023-01-04 ENCOUNTER — Encounter: Payer: Self-pay | Admitting: Internal Medicine

## 2023-01-04 ENCOUNTER — Ambulatory Visit (INDEPENDENT_AMBULATORY_CARE_PROVIDER_SITE_OTHER): Payer: Medicare Other | Admitting: Internal Medicine

## 2023-01-04 VITALS — BP 120/64 | HR 65 | Temp 97.7°F | Wt 169.2 lb

## 2023-01-04 DIAGNOSIS — E119 Type 2 diabetes mellitus without complications: Secondary | ICD-10-CM

## 2023-01-04 DIAGNOSIS — I10 Essential (primary) hypertension: Secondary | ICD-10-CM | POA: Diagnosis not present

## 2023-01-04 DIAGNOSIS — E785 Hyperlipidemia, unspecified: Secondary | ICD-10-CM | POA: Diagnosis not present

## 2023-01-04 LAB — POCT GLYCOSYLATED HEMOGLOBIN (HGB A1C): Hemoglobin A1C: 6.8 % — AB (ref 4.0–5.6)

## 2023-01-04 NOTE — Progress Notes (Signed)
Established Patient Office Visit     CC/Reason for Visit: Follow-up chronic conditions  HPI: [name] is a 82 y.o. male who is coming in today for the above mentioned reasons. Past Medical History is significant for: Hypertension, hyperlipidemia, type 2 diabetes.  He has been feeling well and has no acute concerns or complaints.   Past Medical/Surgical History: Past Medical History:  Diagnosis Date   Arthritis    generalized   Cataract    BILATERAL-REMOVED   Cigarette smoker    Colon polyp    COPD (chronic obstructive pulmonary disease) (HCC)    Pt reports does not have history of COPD   Coronary artery disease    nonobstructive   Diabetes mellitus without complication (HCC)    on meds/diet controlled   Diverticulosis of colon    Gastritis    Hemorrhoids    Hiatal hernia    late 1990's   History of colonic polyps - ? attenuated polyposis 07/27/2007   Adenomas in past, mother w/ probable colon cancer 2004 - none 04/2009 Polyps, multiple (6, max size 8 mm) ADENOMAS 03/2013 - 4mm adenoma (1) - 07/02/2019 11 diminutive polyps removed - adenomas Gatha Mayer, MD, [facility]      History of kidney stones    History of renal calculi    Hyperlipidemia    on meds   IBS (irritable bowel syndrome)    Lumbar back pain    Pre-diabetes     Past Surgical History:  Procedure Laterality Date   ANTERIOR LAT LUMBAR FUSION  08/14/2012   Procedure: ANTERIOR LATERAL LUMBAR FUSION 1 LEVEL;  Surgeon: Charlie Pitter, MD;  Location: [facility];  Service: Neurosurgery;  Laterality: Left;  Left lumbar three-four extreme lumbar interbody fusion with percutaneous pedicle screws   BACK SURGERY     x 4   [place] in [place], 1995 dr deaton   COLONOSCOPY  2021   with polyp removed x 3 or 4   COLONOSCOPY W/ BIOPSIES AND POLYPECTOMY  05/06/2009   adenomous polyp, diverticulosis, external hemorrhoids   CYSTOSCOPY  11/03   TURP and stone removal - Dr Amalia Hailey    CYSTOSCOPY WITH RETROGRADE PYELOGRAM, URETEROSCOPY AND STENT PLACEMENT Bilateral 06/20/2020   Procedure: CYSTOSCOPY WITH BILATERAL  RETROGRADE PYELOGRAM, LEFT URETEROSCOPY AND STENT PLACEMENT;  Surgeon: Alexis Frock, MD;  Location: [facility];  Service: Urology;  Laterality: Bilateral;  75 MINS   dental implants  2007   EYE SURGERY Bilateral 2019   cataracts both eyes   HOLMIUM LASER APPLICATION Left [id number]   Procedure: HOLMIUM LASER APPLICATION;  Surgeon: Alexis Frock, MD;  Location: [facility];  Service: Urology;  Laterality: Left;   LITHOTRIPSY     x 3   LUMBAR MICRODISCECTOMY  12/07   L3-4 by Dr Annette Stable   LUMBAR PERCUTANEOUS PEDICLE SCREW 1 LEVEL  08/14/2012   Procedure: LUMBAR PERCUTANEOUS PEDICLE SCREW 1 LEVEL;  Surgeon: Charlie Pitter, MD;  Location: [facility];  Service: Neurosurgery;  Laterality: Left;  Left lumbar three-four extreme lumbar interbody fusion with percutaneous pedicle screws    ROTATOR CUFF REPAIR     left shoulder 2014, right shoulder 2018   UPPER GASTROINTESTINAL ENDOSCOPY  09/07/2010   erosive gastritis, GERD   UVULOPALATOPHARYNGOPLASTY  2001   Dr Wilburn Cornelia    Social History:  reports that he quit smoking about 5 years ago. His smoking use included cigarettes. He  has a 40.80 pack-year smoking history. He has never used smokeless tobacco. He reports current alcohol use of about 5.0 standard drinks of alcohol per week. He reports that he does not use drugs.  Allergies: No Known Allergies  Family History:  Family History  Problem Relation Age of Onset   Liver cancer Mother    Colon polyps Mother 27   Colon cancer Mother 29       mets to other organs   Diabetes Father    Kidney failure Father    Prostate cancer Brother    Esophageal cancer Neg Hx    Rectal cancer Neg Hx    Stomach cancer Neg Hx      Current Outpatient Medications:    indapamide (LOZOL) 1.25 MG tablet, Take 1.25 mg by mouth daily., Disp: , Rfl:     lisinopril (ZESTRIL) 2.5 MG tablet, TAKE 1 TABLET(2.5 MG) BY MOUTH DAILY, Disp: 90 tablet, Rfl: 1   metFORMIN (GLUCOPHAGE) 500 MG tablet, TAKE 1 TABLET(500 MG) BY MOUTH TWICE DAILY WITH A MEAL, Disp: 180 tablet, Rfl: 1   Multiple Vitamins-Minerals (MULTIVITAMIN ADULT, MINERALS,) TABS, Take 1 tablet by mouth 2 (two) times daily., Disp: , Rfl:    Omega 3-5-6-7-9 Fatty Acids (COMPLETE OMEGA) CAPS, Take 1 tablet by mouth daily at 6 (six) AM. Ultimate Omega, Disp: , Rfl:    simvastatin (ZOCOR) 40 MG tablet, TAKE 1 TABLET BY MOUTH DAILY, Disp: 30 tablet, Rfl: 6  Review of Systems:  Negative unless indicated in HPI.   Physical Exam: Vitals:   01/04/23 0931  BP: 120/64  Pulse: 65  Temp: 97.7 F (36.5 C)  TempSrc: Oral  SpO2: 98%  Weight: 169 lb 3.2 oz (76.7 kg)    Body mass index is 23.6 kg/m.   Physical Exam Vitals reviewed.  Constitutional:      Appearance: Normal appearance.  HENT:     Head: Normocephalic and atraumatic.  Eyes:     Conjunctiva/sclera: Conjunctivae normal.     Pupils: Pupils are equal, round, and reactive to light.  Cardiovascular:     Rate and Rhythm: Normal rate and regular rhythm.  Pulmonary:     Effort: Pulmonary effort is normal.     Breath sounds: Normal breath sounds.  Skin:    General: Skin is warm and dry.  Neurological:     General: No focal deficit present.     Mental Status: He is alert and oriented to person, place, and time.  Psychiatric:        Mood and Affect: Mood normal.        Behavior: Behavior normal.        Thought Content: Thought content normal.        Judgment: Judgment normal.      Impression and Plan:  Diabetes mellitus without complication ([place]) - Plan: POC HgB A1c  Dyslipidemia  Essential hypertension  -Blood pressure is well-controlled. -A1c of 6.8 demonstrates excellent diabetic management. -LDL was at goal at 46 as of November 2023.  Time spent:30 minutes reviewing chart, interviewing and examining patient  and formulating plan of care.     Lelon Frohlich, MD [facility]

## 2023-01-25 DIAGNOSIS — Z6823 Body mass index (BMI) 23.0-23.9, adult: Secondary | ICD-10-CM | POA: Diagnosis not present

## 2023-01-25 DIAGNOSIS — J069 Acute upper respiratory infection, unspecified: Secondary | ICD-10-CM | POA: Diagnosis not present

## 2023-02-26 ENCOUNTER — Other Ambulatory Visit: Payer: Self-pay | Admitting: Internal Medicine

## 2023-02-26 DIAGNOSIS — E785 Hyperlipidemia, unspecified: Secondary | ICD-10-CM

## 2023-04-09 ENCOUNTER — Other Ambulatory Visit: Payer: Self-pay | Admitting: Internal Medicine

## 2023-04-09 DIAGNOSIS — E119 Type 2 diabetes mellitus without complications: Secondary | ICD-10-CM

## 2023-04-26 DIAGNOSIS — L578 Other skin changes due to chronic exposure to nonionizing radiation: Secondary | ICD-10-CM | POA: Diagnosis not present

## 2023-04-26 DIAGNOSIS — L720 Epidermal cyst: Secondary | ICD-10-CM | POA: Diagnosis not present

## 2023-04-26 DIAGNOSIS — L821 Other seborrheic keratosis: Secondary | ICD-10-CM | POA: Diagnosis not present

## 2023-04-26 DIAGNOSIS — D225 Melanocytic nevi of trunk: Secondary | ICD-10-CM | POA: Diagnosis not present

## 2023-04-26 DIAGNOSIS — L281 Prurigo nodularis: Secondary | ICD-10-CM | POA: Diagnosis not present

## 2023-04-26 DIAGNOSIS — D485 Neoplasm of uncertain behavior of skin: Secondary | ICD-10-CM | POA: Diagnosis not present

## 2023-04-26 DIAGNOSIS — L570 Actinic keratosis: Secondary | ICD-10-CM | POA: Diagnosis not present

## 2023-07-05 DIAGNOSIS — L570 Actinic keratosis: Secondary | ICD-10-CM | POA: Diagnosis not present

## 2023-07-05 DIAGNOSIS — L821 Other seborrheic keratosis: Secondary | ICD-10-CM | POA: Diagnosis not present

## 2023-07-06 DIAGNOSIS — E119 Type 2 diabetes mellitus without complications: Secondary | ICD-10-CM | POA: Diagnosis not present

## 2023-07-06 DIAGNOSIS — H5213 Myopia, bilateral: Secondary | ICD-10-CM | POA: Diagnosis not present

## 2023-07-06 DIAGNOSIS — H35373 Puckering of macula, bilateral: Secondary | ICD-10-CM | POA: Diagnosis not present

## 2023-07-06 LAB — HM DIABETES EYE EXAM

## 2023-07-11 ENCOUNTER — Other Ambulatory Visit: Payer: Self-pay | Admitting: General Practice

## 2023-07-11 ENCOUNTER — Encounter: Payer: Medicare Other | Admitting: Internal Medicine

## 2023-07-11 DIAGNOSIS — E785 Hyperlipidemia, unspecified: Secondary | ICD-10-CM

## 2023-07-15 ENCOUNTER — Other Ambulatory Visit (HOSPITAL_BASED_OUTPATIENT_CLINIC_OR_DEPARTMENT_OTHER): Payer: Self-pay

## 2023-07-15 DIAGNOSIS — Z23 Encounter for immunization: Secondary | ICD-10-CM | POA: Diagnosis not present

## 2023-07-15 MED ORDER — INFLUENZA VAC A&B SURF ANT ADJ 0.5 ML IM SUSY
0.5000 mL | PREFILLED_SYRINGE | Freq: Once | INTRAMUSCULAR | 0 refills | Status: AC
  Filled 2023-07-15: qty 0.5, 1d supply, fill #0

## 2023-07-15 MED ORDER — COVID-19 MRNA VAC-TRIS(PFIZER) 30 MCG/0.3ML IM SUSY
0.3000 mL | PREFILLED_SYRINGE | Freq: Once | INTRAMUSCULAR | 0 refills | Status: AC
  Filled 2023-07-15: qty 0.3, 1d supply, fill #0

## 2023-07-19 ENCOUNTER — Encounter: Payer: Medicare Other | Admitting: Internal Medicine

## 2023-07-19 DIAGNOSIS — E119 Type 2 diabetes mellitus without complications: Secondary | ICD-10-CM

## 2023-07-19 DIAGNOSIS — E785 Hyperlipidemia, unspecified: Secondary | ICD-10-CM

## 2023-07-19 DIAGNOSIS — I10 Essential (primary) hypertension: Secondary | ICD-10-CM

## 2023-08-04 ENCOUNTER — Encounter: Payer: Self-pay | Admitting: Internal Medicine

## 2023-08-04 ENCOUNTER — Ambulatory Visit (INDEPENDENT_AMBULATORY_CARE_PROVIDER_SITE_OTHER): Payer: Medicare Other | Admitting: Internal Medicine

## 2023-08-04 VITALS — BP 130/70 | HR 76 | Temp 98.3°F | Ht 71.0 in | Wt 165.7 lb

## 2023-08-04 DIAGNOSIS — Z1159 Encounter for screening for other viral diseases: Secondary | ICD-10-CM

## 2023-08-04 DIAGNOSIS — E119 Type 2 diabetes mellitus without complications: Secondary | ICD-10-CM | POA: Diagnosis not present

## 2023-08-04 DIAGNOSIS — I10 Essential (primary) hypertension: Secondary | ICD-10-CM | POA: Diagnosis not present

## 2023-08-04 DIAGNOSIS — E785 Hyperlipidemia, unspecified: Secondary | ICD-10-CM | POA: Diagnosis not present

## 2023-08-04 DIAGNOSIS — Z0000 Encounter for general adult medical examination without abnormal findings: Secondary | ICD-10-CM | POA: Diagnosis not present

## 2023-08-04 DIAGNOSIS — Z7984 Long term (current) use of oral hypoglycemic drugs: Secondary | ICD-10-CM | POA: Diagnosis not present

## 2023-08-04 DIAGNOSIS — R053 Chronic cough: Secondary | ICD-10-CM

## 2023-08-04 DIAGNOSIS — M170 Bilateral primary osteoarthritis of knee: Secondary | ICD-10-CM

## 2023-08-04 MED ORDER — SIMVASTATIN 40 MG PO TABS
ORAL_TABLET | ORAL | 6 refills | Status: DC
Start: 2023-08-04 — End: 2024-07-24

## 2023-08-04 MED ORDER — METFORMIN HCL 500 MG PO TABS
500.0000 mg | ORAL_TABLET | Freq: Two times a day (BID) | ORAL | 1 refills | Status: DC
Start: 2023-08-04 — End: 2024-04-24

## 2023-08-04 NOTE — Progress Notes (Signed)
Established Patient Office Visit     CC/Reason for Visit: Annual preventive exam and discuss acute concerns  HPI: [name] is a 82 y.o. male who is coming in today for the above mentioned reasons. Past Medical History is significant for: Hypertension, hyperlipidemia, type 2 diabetes.  Feeling well without major concerns or complaints.  2 things have been bothering him, 1 is a chronic cough for the past 4 to 5 years.  It is dry in nature.  He also believes he has bilateral knee arthritis and is requesting referral to orthopedics to see if he would qualify for steroid injections.   Past Medical/Surgical History: Past Medical History:  Diagnosis Date   Arthritis    generalized   Cataract    BILATERAL-REMOVED   Cigarette smoker    Colon polyp    COPD (chronic obstructive pulmonary disease) (HCC)    Pt reports does not have history of COPD   Coronary artery disease    nonobstructive   Diabetes mellitus without complication (HCC)    on meds/diet controlled   Diverticulosis of colon    Gastritis    Hemorrhoids    Hiatal hernia    late 1990's   History of colonic polyps - ? attenuated polyposis 07/27/2007   Adenomas in past, mother w/ probable colon cancer 2004 - none 04/2009 Polyps, multiple (6, max size 8 mm) ADENOMAS 03/2013 - 6mm adenoma (1) - 07/02/2019 11 diminutive polyps removed - adenomas Iva Boop, [facility], [facility]      History of kidney stones    History of renal calculi    Hyperlipidemia    on meds   IBS (irritable bowel syndrome)    Lumbar back pain    Pre-diabetes     Past Surgical History:  Procedure Laterality Date   ANTERIOR LAT LUMBAR FUSION  08/14/2012   Procedure: ANTERIOR LATERAL LUMBAR FUSION 1 LEVEL;  Surgeon: Temple Pacini, MD;  Location: [facility];  Service: Neurosurgery;  Laterality: Left;  Left lumbar three-four extreme lumbar interbody fusion with percutaneous pedicle screws   BACK SURGERY     x 4   CERVICAL SPINE SURGERY  1986, 1995    1986 in [place], 1995 dr deaton   COLONOSCOPY  2021   with polyp removed x 3 or 4   COLONOSCOPY W/ BIOPSIES AND POLYPECTOMY  05/06/2009   adenomous polyp, diverticulosis, external hemorrhoids   CYSTOSCOPY  11/03   TURP and stone removal - Dr Logan Bores   CYSTOSCOPY WITH RETROGRADE PYELOGRAM, URETEROSCOPY AND STENT PLACEMENT Bilateral 06/20/2020   Procedure: CYSTOSCOPY WITH BILATERAL  RETROGRADE PYELOGRAM, LEFT URETEROSCOPY AND STENT PLACEMENT;  Surgeon: Sebastian Ache, MD;  Location: [facility];  Service: Urology;  Laterality: Bilateral;  75 MINS   dental implants  2007   EYE SURGERY Bilateral 2019   cataracts both eyes   HOLMIUM LASER APPLICATION Left 06/20/2020   Procedure: HOLMIUM LASER APPLICATION;  Surgeon: Sebastian Ache, MD;  Location: [facility];  Service: Urology;  Laterality: Left;   LITHOTRIPSY     x 3   LUMBAR MICRODISCECTOMY  12/07   L3-4 by Dr Jordan Likes   LUMBAR PERCUTANEOUS PEDICLE SCREW 1 LEVEL  08/14/2012   Procedure: LUMBAR PERCUTANEOUS PEDICLE SCREW 1 LEVEL;  Surgeon: Temple Pacini, MD;  Location: [facility];  Service: Neurosurgery;  Laterality: Left;  Left lumbar three-four extreme lumbar interbody fusion with percutaneous pedicle screws    ROTATOR CUFF REPAIR  left shoulder 2014, right shoulder 2018   UPPER GASTROINTESTINAL ENDOSCOPY  09/07/2010   erosive gastritis, GERD   UVULOPALATOPHARYNGOPLASTY  2001   Dr Annalee Genta    Social History:  reports that he quit smoking about 5 years ago. His smoking use included cigarettes. He started smoking about 56 years ago. He has a 40.8 pack-year smoking history. He has never used smokeless tobacco. He reports current alcohol use of about 5.0 standard drinks of alcohol per week. He reports that he does not use drugs.  Allergies: No Known Allergies  Family History:  Family History  Problem Relation Age of Onset   Liver cancer Mother    Colon polyps Mother 28   Colon cancer Mother 8       mets  to other organs   Diabetes Father    Kidney failure Father    Prostate cancer Brother    Esophageal cancer Neg Hx    Rectal cancer Neg Hx    Stomach cancer Neg Hx      Current Outpatient Medications:    indapamide (LOZOL) 1.25 MG tablet, Take 1.25 mg by mouth daily., Disp: , Rfl:    Multiple Vitamins-Minerals (MULTIVITAMIN ADULT, MINERALS,) TABS, Take 1 tablet by mouth 2 (two) times daily., Disp: , Rfl:    Omega 3-5-6-7-9 Fatty Acids (COMPLETE OMEGA) CAPS, Take 1 tablet by mouth daily at 6 (six) AM. Ultimate Omega, Disp: , Rfl:    metFORMIN (GLUCOPHAGE) 500 MG tablet, Take 1 tablet (500 mg total) by mouth 2 (two) times daily with a meal., Disp: 180 tablet, Rfl: 1   simvastatin (ZOCOR) 40 MG tablet, TAKE 1 TABLET BY MOUTH DAILY, Disp: 30 tablet, Rfl: 6  Review of Systems:  Negative unless indicated in HPI.   Physical Exam: Vitals:   08/04/23 1001  BP: 130/70  Pulse: 76  Temp: 98.3 F (36.8 C)  TempSrc: Oral  SpO2: 96%  Weight: 165 lb 11.2 oz (75.2 kg)  Height: 5\' 11"  (1.803 m)    Body mass index is 23.11 kg/m.   Physical Exam Vitals reviewed.  Constitutional:      Appearance: Normal appearance.  HENT:     Head: Normocephalic and atraumatic.  Eyes:     Conjunctiva/sclera: Conjunctivae normal.     Pupils: Pupils are equal, round, and reactive to light.  Cardiovascular:     Rate and Rhythm: Normal rate and regular rhythm.  Pulmonary:     Effort: Pulmonary effort is normal.     Breath sounds: Normal breath sounds.  Skin:    General: Skin is warm and dry.  Neurological:     General: No focal deficit present.     Mental Status: He is alert and oriented to person, place, and time.  Psychiatric:        Mood and Affect: Mood normal.        Behavior: Behavior normal.        Thought Content: Thought content normal.        Judgment: Judgment normal.    Subsequent Medicare wellness visit   1. Risk factors, based on past  M,S,F - Cardiac Risk Factors include:  advanced age (>26men, >17 women);diabetes mellitus;dyslipidemia;male gender;hypertension   2.  Physical activities: Dietary issues and exercise activities discussed:      3.  Depression/mood:  Flowsheet Row Office Visit from 08/19/2022 in [facility] Total Score 2        4.  ADL's:    08/04/2023  9:54 AM  In your present state of health, do you have any difficulty performing the following activities:  Hearing? 1  Comment wears hearing aids  Vision? 0  Difficulty concentrating or making decisions? 0  Walking or climbing stairs? 1  Dressing or bathing? 0  Doing errands, shopping? 0  Preparing Food and eating ? N  Using the Toilet? N  In the past six months, have you accidently leaked urine? Y  Do you have problems with loss of bowel control? N  Managing your Medications? N  Managing your Finances? N  Housekeeping or managing your Housekeeping? N     5.  Fall risk:     11/23/2021    9:08 AM 02/15/2022    9:05 AM 08/19/2022    9:08 AM 01/04/2023    9:35 AM 08/04/2023    9:57 AM  Fall Risk  Falls in the past year? 0 0 1 0 0  Was there an injury with Fall? 0 0 0 0 0  Fall Risk Category Calculator 0 0 2 0 0  Fall Risk Category (Retired) Low Low Moderate    (RETIRED) Patient Fall Risk Level  Low fall risk Moderate fall risk    Patient at Risk for Falls Due to  No Fall Risks Impaired balance/gait    Fall risk Follow up Falls evaluation completed Falls evaluation completed Falls evaluation completed Falls evaluation completed Falls evaluation completed     6.  Home safety: No problems identified   7.  Height weight, and visual acuity: height and weight as above, vision/hearing: Vision Screening   Right eye Left eye Both eyes  Without correction     With correction 20/30 20/30 20/30      8.  Counseling: Counseling given: Not Answered    9. Lab orders based on risk factors: Laboratory update will be reviewed   10. Cognitive  assessment:        08/04/2023    9:57 AM  6CIT Screen  What Year? 0 points  What month? 0 points  What time? 0 points  Count back from 20 0 points  Months in reverse 0 points  Repeat phrase 0 points  Total Score 0 points     11. Screening: Patient provided with a written and personalized 5-10 year screening schedule in the AVS. Health Maintenance  Topic Date Due   Hepatitis C Screening  Never done   Zoster (Shingles) Vaccine (1 of 2) 09/20/1993   Screening for Lung Cancer  09/17/2022   Yearly kidney health urinalysis for diabetes  02/16/2023   Hemoglobin A1C  07/07/2023   Yearly kidney function blood test for diabetes  08/20/2023   Colon Cancer Screening  06/10/2024   Eye exam for diabetics  07/05/2024   Complete foot exam   08/03/2024   Medicare Annual Wellness Visit  08/03/2024   DTaP/Tdap/Td vaccine (2 - Td or Tdap) 06/05/2025   Pneumonia Vaccine  Completed   Flu Shot  Completed   COVID-19 Vaccine  Completed   HPV Vaccine  Aged Out    12. Provider List Update: Patient Care Team    Relationship Specialty Notifications Start End  Philip Aspen, Limmie Patricia, MD PCP - General Internal Medicine  09/13/18   Rollene Rotunda, MD PCP - Cardiology Cardiology Admissions 04/03/20      13. Advance Directives: Does Patient Have a Medical Advance Directive?: Yes Type of Advance Directive: Living will, Out of facility DNR (pink MOST or yellow form), Healthcare Power of Attorney Does patient  want to make changes to medical advance directive?: No - Patient declined Copy of Healthcare Power of Attorney in Chart?: No - copy requested  14. Opioids: Patient is not on any opioid prescriptions and has no risk factors for a substance use disorder.   15.   Goals      Become More Active     Timeframe:  Long-Range Goal Priority:  Medium Start Date:                             Expected End Date:                       Follow Up Date 08/03/2024   - choose a type of activity I enjoy  such as biking, gardening, team sports, walking    Why is this important?   It is easy to come up with reasons not to exercise.  These steps will help you get started and have fun doing it.    Notes:          I have personally reviewed and noted the following in the patient's chart:   Medical and social history Use of alcohol, tobacco or illicit drugs  Current medications and supplements Functional ability and status Nutritional status Physical activity Advanced directives List of other physicians Hospitalizations, surgeries, and ER visits in previous 12 months Vitals Screenings to include cognitive, depression, and falls Referrals and appointments  In addition, I have reviewed and discussed with patient certain preventive protocols, quality metrics, and best practice recommendations. A written personalized care plan for preventive services as well as general preventive health recommendations were provided to patient.   Impression and Plan:  Encounter for subsequent annual wellness visit (AWV) in Medicare patient -     PSA; Future  Diabetes mellitus without complication (HCC) -     CBC with Differential/Platelet; Future -     Comprehensive metabolic panel; Future -     Hemoglobin A1c; Future -     Microalbumin / creatinine urine ratio; Future -     metFORMIN HCl; Take 1 tablet (500 mg total) by mouth 2 (two) times daily with a meal.  Dispense: 180 tablet; Refill: 1  Dyslipidemia -     Lipid panel; Future -     Simvastatin; TAKE 1 TABLET BY MOUTH DAILY  Dispense: 30 tablet; Refill: 6  Essential hypertension  Chronic cough  Encounter for hepatitis C screening test for low risk patient  Bilateral primary osteoarthritis of knee -     Ambulatory referral to Orthopedic Surgery   -Recommend routine eye and dental care. -Healthy lifestyle discussed in detail. -Labs to be updated today. -Prostate cancer screening: N/A Health Maintenance  Topic Date Due   Hepatitis  C Screening  Never done   Zoster (Shingles) Vaccine (1 of 2) 09/20/1993   Screening for Lung Cancer  09/17/2022   Yearly kidney health urinalysis for diabetes  02/16/2023   Hemoglobin A1C  07/07/2023   Yearly kidney function blood test for diabetes  08/20/2023   Colon Cancer Screening  06/10/2024   Eye exam for diabetics  07/05/2024   Complete foot exam   08/03/2024   Medicare Annual Wellness Visit  08/03/2024   DTaP/Tdap/Td vaccine (2 - Td or Tdap) 06/05/2025   Pneumonia Vaccine  Completed   Flu Shot  Completed   COVID-19 Vaccine  Completed   HPV Vaccine  Aged Out    -He  will obtain shingles vaccine at pharmacy, all other immunizations are up-to-date. -Wonder if his chronic cough is ACE inhibitor induced, we will stop lisinopril. -Referral to orthopedics for his presumed bilateral knee osteoarthritis.      Chaya Jan, MD [facility]

## 2023-08-11 ENCOUNTER — Other Ambulatory Visit: Payer: Medicare Other

## 2023-08-11 DIAGNOSIS — Z0000 Encounter for general adult medical examination without abnormal findings: Secondary | ICD-10-CM | POA: Diagnosis not present

## 2023-08-11 DIAGNOSIS — E119 Type 2 diabetes mellitus without complications: Secondary | ICD-10-CM | POA: Diagnosis not present

## 2023-08-11 DIAGNOSIS — E785 Hyperlipidemia, unspecified: Secondary | ICD-10-CM | POA: Diagnosis not present

## 2023-08-11 LAB — COMPREHENSIVE METABOLIC PANEL
ALT: 12 U/L (ref 0–53)
AST: 14 U/L (ref 0–37)
Albumin: 4.4 g/dL (ref 3.5–5.2)
Alkaline Phosphatase: 67 U/L (ref 39–117)
BUN: 19 mg/dL (ref 6–23)
CO2: 29 meq/L (ref 19–32)
Calcium: 9.4 mg/dL (ref 8.4–10.5)
Chloride: 100 meq/L (ref 96–112)
Creatinine, Ser: 1.12 mg/dL (ref 0.40–1.50)
GFR: 62.32 mL/min (ref >60.00)
Glucose, Bld: 182 mg/dL — ABNORMAL HIGH (ref 70–99)
Potassium: 4 meq/L (ref 3.5–5.1)
Sodium: 137 meq/L (ref 135–145)
Total Bilirubin: 0.7 mg/dL (ref 0.2–1.2)
Total Protein: 6.3 g/dL (ref 6.0–8.3)

## 2023-08-11 LAB — CBC WITH DIFFERENTIAL/PLATELET
Basophils Absolute: 0 10*3/uL (ref 0.0–0.1)
Basophils Relative: 0.4 % (ref 0.0–3.0)
Eosinophils Absolute: 0.2 10*3/uL (ref 0.0–0.7)
Eosinophils Relative: 3.6 % (ref 0.0–5.0)
HCT: 42.6 % (ref 39.0–52.0)
Hemoglobin: 13.9 g/dL (ref 13.0–17.0)
Lymphocytes Relative: 31.3 % (ref 12.0–46.0)
Lymphs Abs: 1.6 10*3/uL (ref 0.7–4.0)
MCHC: 32.6 g/dL (ref 30.0–36.0)
MCV: 94.1 fL (ref 78.0–100.0)
Monocytes Absolute: 0.5 10*3/uL (ref 0.1–1.0)
Monocytes Relative: 9.1 % (ref 3.0–12.0)
Neutro Abs: 2.9 10*3/uL (ref 1.4–7.7)
Neutrophils Relative %: 55.6 % (ref 43.0–77.0)
Platelets: 241 10*3/uL (ref 150.0–400.0)
RBC: 4.53 Mil/uL (ref 4.22–5.81)
RDW: 13 % (ref 11.5–15.5)
WBC: 5.2 10*3/uL (ref 4.0–10.5)

## 2023-08-11 LAB — MICROALBUMIN / CREATININE URINE RATIO
Creatinine,U: 97.7 mg/dL
Microalb Creat Ratio: 5.3 mg/g (ref 0.0–30.0)
Microalb, Ur: 5.2 mg/dL — ABNORMAL HIGH (ref 0.0–1.9)

## 2023-08-11 LAB — LIPID PANEL
Cholesterol: 129 mg/dL (ref 0–200)
HDL: 53.4 mg/dL (ref >39.00)
LDL Cholesterol: 58 mg/dL (ref 0–99)
NonHDL: 75.52
Total CHOL/HDL Ratio: 2
Triglycerides: 90 mg/dL (ref 0.0–149.0)
VLDL: 18 mg/dL (ref 0.0–40.0)

## 2023-08-11 LAB — PSA: PSA: 1.17 ng/mL (ref 0.10–4.00)

## 2023-08-11 LAB — HEMOGLOBIN A1C: Hgb A1c MFr Bld: 6.8 % — ABNORMAL HIGH (ref 4.6–6.5)

## 2023-08-22 DIAGNOSIS — N202 Calculus of kidney with calculus of ureter: Secondary | ICD-10-CM | POA: Diagnosis not present

## 2023-08-22 DIAGNOSIS — N281 Cyst of kidney, acquired: Secondary | ICD-10-CM | POA: Diagnosis not present

## 2023-08-22 DIAGNOSIS — N5201 Erectile dysfunction due to arterial insufficiency: Secondary | ICD-10-CM | POA: Diagnosis not present

## 2023-09-08 ENCOUNTER — Other Ambulatory Visit: Payer: Self-pay | Admitting: Internal Medicine

## 2023-09-23 DIAGNOSIS — M170 Bilateral primary osteoarthritis of knee: Secondary | ICD-10-CM | POA: Diagnosis not present

## 2023-12-26 DIAGNOSIS — M79642 Pain in left hand: Secondary | ICD-10-CM | POA: Diagnosis not present

## 2024-04-22 ENCOUNTER — Other Ambulatory Visit: Payer: Self-pay | Admitting: Internal Medicine

## 2024-04-22 DIAGNOSIS — E119 Type 2 diabetes mellitus without complications: Secondary | ICD-10-CM

## 2024-05-03 DIAGNOSIS — D225 Melanocytic nevi of trunk: Secondary | ICD-10-CM | POA: Diagnosis not present

## 2024-05-03 DIAGNOSIS — L578 Other skin changes due to chronic exposure to nonionizing radiation: Secondary | ICD-10-CM | POA: Diagnosis not present

## 2024-05-03 DIAGNOSIS — D2330 Other benign neoplasm of skin of unspecified part of face: Secondary | ICD-10-CM | POA: Diagnosis not present

## 2024-05-03 DIAGNOSIS — L821 Other seborrheic keratosis: Secondary | ICD-10-CM | POA: Diagnosis not present

## 2024-05-03 DIAGNOSIS — L570 Actinic keratosis: Secondary | ICD-10-CM | POA: Diagnosis not present

## 2024-07-06 DIAGNOSIS — H5213 Myopia, bilateral: Secondary | ICD-10-CM | POA: Diagnosis not present

## 2024-07-06 DIAGNOSIS — H35373 Puckering of macula, bilateral: Secondary | ICD-10-CM | POA: Diagnosis not present

## 2024-07-06 DIAGNOSIS — E119 Type 2 diabetes mellitus without complications: Secondary | ICD-10-CM | POA: Diagnosis not present

## 2024-07-06 LAB — HM DIABETES EYE EXAM

## 2024-07-10 ENCOUNTER — Encounter: Payer: Self-pay | Admitting: Internal Medicine

## 2024-07-23 ENCOUNTER — Other Ambulatory Visit: Payer: Self-pay | Admitting: General Practice

## 2024-07-23 DIAGNOSIS — E785 Hyperlipidemia, unspecified: Secondary | ICD-10-CM

## 2024-07-30 ENCOUNTER — Encounter: Payer: Self-pay | Admitting: Internal Medicine

## 2024-08-06 ENCOUNTER — Other Ambulatory Visit (HOSPITAL_BASED_OUTPATIENT_CLINIC_OR_DEPARTMENT_OTHER): Payer: Self-pay

## 2024-08-06 DIAGNOSIS — Z23 Encounter for immunization: Secondary | ICD-10-CM | POA: Diagnosis not present

## 2024-08-06 MED ORDER — FLUZONE HIGH-DOSE 0.5 ML IM SUSY
0.5000 mL | PREFILLED_SYRINGE | Freq: Once | INTRAMUSCULAR | 0 refills | Status: AC
Start: 2024-08-06 — End: 2024-08-07
  Filled 2024-08-06: qty 0.5, 1d supply, fill #0

## 2024-08-07 ENCOUNTER — Encounter: Payer: Medicare Other | Admitting: Internal Medicine

## 2024-08-07 ENCOUNTER — Ambulatory Visit (INDEPENDENT_AMBULATORY_CARE_PROVIDER_SITE_OTHER): Admitting: Internal Medicine

## 2024-08-07 ENCOUNTER — Encounter: Payer: Self-pay | Admitting: Internal Medicine

## 2024-08-07 VITALS — BP 124/78 | HR 67 | Temp 98.3°F | Ht 71.0 in | Wt 164.8 lb

## 2024-08-07 DIAGNOSIS — Z125 Encounter for screening for malignant neoplasm of prostate: Secondary | ICD-10-CM | POA: Diagnosis not present

## 2024-08-07 DIAGNOSIS — E1169 Type 2 diabetes mellitus with other specified complication: Secondary | ICD-10-CM

## 2024-08-07 DIAGNOSIS — Z0000 Encounter for general adult medical examination without abnormal findings: Secondary | ICD-10-CM | POA: Diagnosis not present

## 2024-08-07 DIAGNOSIS — E785 Hyperlipidemia, unspecified: Secondary | ICD-10-CM | POA: Diagnosis not present

## 2024-08-07 DIAGNOSIS — M25551 Pain in right hip: Secondary | ICD-10-CM

## 2024-08-07 DIAGNOSIS — E119 Type 2 diabetes mellitus without complications: Secondary | ICD-10-CM

## 2024-08-07 DIAGNOSIS — F17210 Nicotine dependence, cigarettes, uncomplicated: Secondary | ICD-10-CM

## 2024-08-07 DIAGNOSIS — I10 Essential (primary) hypertension: Secondary | ICD-10-CM | POA: Diagnosis not present

## 2024-08-07 DIAGNOSIS — Z122 Encounter for screening for malignant neoplasm of respiratory organs: Secondary | ICD-10-CM

## 2024-08-07 LAB — CBC WITH DIFFERENTIAL/PLATELET
Basophils Absolute: 0 K/uL (ref 0.0–0.1)
Basophils Relative: 0.6 % (ref 0.0–3.0)
Eosinophils Absolute: 0.2 K/uL (ref 0.0–0.7)
Eosinophils Relative: 3.4 % (ref 0.0–5.0)
HCT: 44.5 % (ref 39.0–52.0)
Hemoglobin: 14.9 g/dL (ref 13.0–17.0)
Lymphocytes Relative: 25.1 % (ref 12.0–46.0)
Lymphs Abs: 1.2 K/uL (ref 0.7–4.0)
MCHC: 33.4 g/dL (ref 30.0–36.0)
MCV: 92 fl (ref 78.0–100.0)
Monocytes Absolute: 0.5 K/uL (ref 0.1–1.0)
Monocytes Relative: 9.8 % (ref 3.0–12.0)
Neutro Abs: 2.8 K/uL (ref 1.4–7.7)
Neutrophils Relative %: 61.1 % (ref 43.0–77.0)
Platelets: 251 K/uL (ref 150.0–400.0)
RBC: 4.84 Mil/uL (ref 4.22–5.81)
RDW: 13.3 % (ref 11.5–15.5)
WBC: 4.6 K/uL (ref 4.0–10.5)

## 2024-08-07 LAB — COMPREHENSIVE METABOLIC PANEL WITH GFR
ALT: 18 U/L (ref 0–53)
AST: 16 U/L (ref 0–37)
Albumin: 4.8 g/dL (ref 3.5–5.2)
Alkaline Phosphatase: 84 U/L (ref 39–117)
BUN: 22 mg/dL (ref 6–23)
CO2: 28 meq/L (ref 19–32)
Calcium: 9.5 mg/dL (ref 8.4–10.5)
Chloride: 100 meq/L (ref 96–112)
Creatinine, Ser: 1.08 mg/dL (ref 0.40–1.50)
GFR: 64.64 mL/min (ref >60.00)
Glucose, Bld: 136 mg/dL — ABNORMAL HIGH (ref 70–99)
Potassium: 4.5 meq/L (ref 3.5–5.1)
Sodium: 138 meq/L (ref 135–145)
Total Bilirubin: 0.7 mg/dL (ref 0.2–1.2)
Total Protein: 6.7 g/dL (ref 6.0–8.3)

## 2024-08-07 LAB — LIPID PANEL
Cholesterol: 135 mg/dL (ref 0–200)
HDL: 55.5 mg/dL (ref >39.00)
LDL Cholesterol: 62 mg/dL (ref 0–99)
NonHDL: 79.36
Total CHOL/HDL Ratio: 2
Triglycerides: 86 mg/dL (ref 0.0–149.0)
VLDL: 17.2 mg/dL (ref 0.0–40.0)

## 2024-08-07 LAB — HEMOGLOBIN A1C: Hgb A1c MFr Bld: 7.6 % — ABNORMAL HIGH (ref 4.6–6.5)

## 2024-08-07 LAB — MICROALBUMIN / CREATININE URINE RATIO
Creatinine,U: 83.7 mg/dL
Microalb Creat Ratio: 169.1 mg/g — ABNORMAL HIGH (ref 0.0–30.0)
Microalb, Ur: 14.2 mg/dL — ABNORMAL HIGH (ref 0.0–1.9)

## 2024-08-07 LAB — PSA: PSA: 1.59 ng/mL (ref 0.10–4.00)

## 2024-08-07 NOTE — Progress Notes (Signed)
 Established Patient Office Visit     CC/Reason for Visit: Subsequent Medicare wellness visit and annual follow-up chronic medical conditions  HPI: [name] is a 83 y.o. male who is coming in today for the above mentioned reasons. Past Medical History is significant for: Type 2 diabetes, hypertension, hyperlipidemia.  Feeling well.  Has been having some mild dysphagia to solids.  Already has an appointment with GI scheduled for December.  Would like a referral to Dr. Ernie for his right hip pain.  He has been seeing Dr. Hiram for his left knee pain.  Due for COVID and shingles vaccines.  Has aged out of colonoscopies.  Is an occasional smoker.   Past Medical/Surgical History: Past Medical History:  Diagnosis Date   Arthritis    generalized   Cataract    BILATERAL-REMOVED   Cigarette smoker    Colon polyp    COPD (chronic obstructive pulmonary disease) (HCC)    Pt reports does not have history of COPD   Coronary artery disease    nonobstructive   Diabetes mellitus without complication (HCC)    on meds/diet controlled   Diverticulosis of colon    Gastritis    Hemorrhoids    Hiatal hernia    late 1990's   History of colonic polyps - ? attenuated polyposis 07/27/2007   Adenomas in past, mother w/ probable colon cancer 2004 - none 04/2009 Polyps, multiple (6, max size 8 mm) ADENOMAS 03/2013 - 6mm adenoma (1) - 07/02/2019 11 diminutive polyps removed - adenomas [name], MD, [facility]      History of kidney stones    History of renal calculi    Hyperlipidemia    on meds   IBS (irritable bowel syndrome)    Lumbar back pain    Pre-diabetes     Past Surgical History:  Procedure Laterality Date   ANTERIOR LAT LUMBAR FUSION  08/14/2012   Procedure: ANTERIOR LATERAL LUMBAR FUSION 1 LEVEL;  Surgeon: Victory DELENA Gunnels, MD;  Location: [facility];  Service: Neurosurgery;  Laterality: Left;  Left lumbar three-four extreme lumbar interbody fusion with percutaneous pedicle screws    BACK SURGERY     x 4   CERVICAL SPINE SURGERY  1986, 1995   1986 in [place], 1995 dr deaton   COLONOSCOPY  2021   with polyp removed x 3 or 4   COLONOSCOPY W/ BIOPSIES AND POLYPECTOMY  05/06/2009   adenomous polyp, diverticulosis, external hemorrhoids   CYSTOSCOPY  11/03   TURP and stone removal - Dr Janit   CYSTOSCOPY WITH RETROGRADE PYELOGRAM, URETEROSCOPY AND STENT PLACEMENT Bilateral 06/20/2020   Procedure: CYSTOSCOPY WITH BILATERAL  RETROGRADE PYELOGRAM, LEFT URETEROSCOPY AND STENT PLACEMENT;  Surgeon: Alvaro Hummer, MD;  Location: [facility];  Service: Urology;  Laterality: Bilateral;  75 MINS   dental implants  2007   EYE SURGERY Bilateral 2019   cataracts both eyes   HOLMIUM LASER APPLICATION Left 06/20/2020   Procedure: HOLMIUM LASER APPLICATION;  Surgeon: Alvaro Hummer, MD;  [facility];  Service: Urology;  Laterality: Left;   LITHOTRIPSY     x 3   LUMBAR MICRODISCECTOMY  12/07   L3-4 by Dr Gunnels   LUMBAR PERCUTANEOUS PEDICLE SCREW 1 LEVEL  08/14/2012   Procedure: LUMBAR PERCUTANEOUS PEDICLE SCREW 1 LEVEL;  Surgeon: Victory DELENA Gunnels, MD;  Location: [facility];  Service: Neurosurgery;  Laterality: Left;  Left lumbar three-four extreme lumbar interbody fusion with percutaneous pedicle  screws    ROTATOR CUFF REPAIR     left shoulder 2014, right shoulder 2018   UPPER GASTROINTESTINAL ENDOSCOPY  09/07/2010   erosive gastritis, GERD   UVULOPALATOPHARYNGOPLASTY  2001   Dr Mable    Social History:  reports that he has been smoking cigarettes. He started smoking about 57 years ago. He has a 40.8 pack-year smoking history. He has never used smokeless tobacco. He reports current alcohol use of about 5.0 standard drinks of alcohol per week. He reports that he does not use drugs.  Allergies: No Known Allergies  Family History:  Family History  Problem Relation Age of Onset   Liver cancer Mother    Colon polyps Mother 74   Colon  cancer Mother 24       mets to other organs   Diabetes Father    Kidney failure Father    Prostate cancer Brother    Esophageal cancer Neg Hx    Rectal cancer Neg Hx    Stomach cancer Neg Hx      Current Outpatient Medications:    indapamide (LOZOL) 1.25 MG tablet, Take 1.25 mg by mouth daily., Disp: , Rfl:    Influenza vac split trivalent PF (FLUZONE HIGH-DOSE) 0.5 ML injection, Inject 0.5 mLs into the muscle once for 1 dose., Disp: 0.5 mL, Rfl: 0   metFORMIN  (GLUCOPHAGE ) 500 MG tablet, TAKE 1 TABLET(500 MG) BY MOUTH TWICE DAILY WITH A MEAL, Disp: 180 tablet, Rfl: 1   Multiple Vitamins-Minerals (MULTIVITAMIN ADULT, MINERALS,) TABS, Take 1 tablet by mouth 2 (two) times daily., Disp: , Rfl:    Omega 3-5-6-7-9 Fatty Acids (COMPLETE OMEGA) CAPS, Take 1 tablet by mouth daily at 6 (six) AM. Ultimate Omega, Disp: , Rfl:    simvastatin  (ZOCOR ) 40 MG tablet, TAKE 1 TABLET BY MOUTH DAILY, Disp: 30 tablet, Rfl: 0  Review of Systems:  Negative unless indicated in HPI.   Physical Exam: Vitals:   08/07/24 1008 08/07/24 1015  BP: 124/84 124/78  Pulse: 67   Temp: 98.3 F (36.8 C)   TempSrc: Oral   SpO2: 98%   Weight: 164 lb 12.8 oz (74.8 kg)   Height: 5' 11 (1.803 m)     Body mass index is 22.98 kg/m.   Physical Exam Vitals reviewed.  Constitutional:      General: He is not in acute distress.    Appearance: Normal appearance. He is not ill-appearing, toxic-appearing or diaphoretic.  HENT:     Head: Normocephalic.     Right Ear: Tympanic membrane, ear canal and external ear normal. There is no impacted cerumen.     Left Ear: Tympanic membrane, ear canal and external ear normal. There is no impacted cerumen.     Nose: Nose normal.     Mouth/Throat:     Mouth: Mucous membranes are moist.     Pharynx: Oropharynx is clear. No oropharyngeal exudate or posterior oropharyngeal erythema.  Eyes:     General: No scleral icterus.       Right eye: No discharge.        Left eye: No  discharge.     Conjunctiva/sclera: Conjunctivae normal.     Pupils: Pupils are equal, round, and reactive to light.  Neck:     Vascular: No carotid bruit.  Cardiovascular:     Rate and Rhythm: Normal rate and regular rhythm.     Pulses: Normal pulses.     Heart sounds: Normal heart sounds.  Pulmonary:     Effort: Pulmonary  effort is normal. No respiratory distress.     Breath sounds: Normal breath sounds.  Abdominal:     General: Abdomen is flat. Bowel sounds are normal.     Palpations: Abdomen is soft.  Musculoskeletal:        General: Normal range of motion.     Cervical back: Normal range of motion.  Skin:    General: Skin is warm and dry.  Neurological:     General: No focal deficit present.     Mental Status: He is alert and oriented to person, place, and time. Mental status is at baseline.  Psychiatric:        Mood and Affect: Mood normal.        Behavior: Behavior normal.        Thought Content: Thought content normal.        Judgment: Judgment normal.    Subsequent Medicare wellness visit   1. Risk factors, based on past  M,S,F - Cardiac Risk Factors include: advanced age (>44men, >51 women);diabetes mellitus;dyslipidemia;hypertension;male gender   2.  Physical activities: Dietary issues and exercise activities discussed:      3.  Depression/mood:  Flowsheet Row Clinical Support from 08/07/2024 in [facility] Total Score 0     4.  ADL's:    08/07/2024   10:01 AM  In your present state of health, do you have any difficulty performing the following activities:  Hearing? 1  Comment hearing aids  Vision? 0  Difficulty concentrating or making decisions? 0  Walking or climbing stairs? 1  Dressing or bathing? 0  Doing errands, shopping? 0  Preparing Food and eating ? N  Using the Toilet? N  In the past six months, have you accidently leaked urine? Y  Do you have problems with loss of bowel control? Y  Managing your  Medications? N  Managing your Finances? N  Housekeeping or managing your Housekeeping? N     5.  Fall risk:     02/15/2022    9:05 AM 08/19/2022    9:08 AM 01/04/2023    9:35 AM 08/04/2023    9:57 AM 08/07/2024   10:04 AM  Fall Risk  Falls in the past year? 0 1 0 0 1  Was there an injury with Fall? 0 0 0 0 0  Fall Risk Category Calculator 0 2 0 0 2  Fall Risk Category (Retired) Low  Moderate      (RETIRED) Patient Fall Risk Level Low fall risk  Moderate fall risk      Patient at Risk for Falls Due to No Fall Risks Impaired balance/gait     Fall risk Follow up Falls evaluation completed  Falls evaluation completed  Falls evaluation completed Falls evaluation completed Falls evaluation completed     Data saved with a previous flowsheet row definition     6.  Home safety: No problems identified   7.  Height weight, and visual acuity: height and weight as above, vision/hearing: Vision Screening   Right eye Left eye Both eyes  Without correction     With correction 20/30 20/30 20/30      8.  Counseling: Ready to quit: Not Answered Counseling given: Not Answered    9. Lab orders based on risk factors: Laboratory update will be reviewed   10. Cognitive assessment:        08/07/2024   10:06 AM 08/04/2023    9:57 AM  6CIT Screen  What Year? 0  points 0 points  What month? 0 points 0 points  What time? 0 points 0 points  Count back from 20 0 points 0 points  Months in reverse 0 points 0 points  Repeat phrase 0 points 0 points  Total Score 0 points 0 points     11. Screening: Patient provided with a written and personalized 5-10 year screening schedule in the AVS. Health Maintenance  Topic Date Due   Yearly kidney health urinalysis for diabetes  Never done   Zoster (Shingles) Vaccine (1 of 2) 09/20/1962   Screening for Lung Cancer  09/17/2022   Hemoglobin A1C  02/09/2024   Colon Cancer Screening  06/10/2024   COVID-19 Vaccine (7 - 2025-26 season) 06/18/2024   Yearly  kidney function blood test for diabetes  08/10/2024   DTaP/Tdap/Td vaccine (2 - Td or Tdap) 06/05/2025   Eye exam for diabetics  07/06/2025   Complete foot exam   08/07/2025   Medicare Annual Wellness Visit  08/07/2025   Pneumococcal Vaccine for age over 18  Completed   Flu Shot  Completed   Meningitis B Vaccine  Aged Out    12. Provider List Update: Patient Care Team    Relationship Specialty Notifications Start End  Theophilus Andrews, Tully GRADE, MD PCP - General Internal Medicine  09/13/18   Lavona Agent, MD PCP - Cardiology Cardiology Admissions 04/03/20      13. Advance Directives: Does Patient Have a Medical Advance Directive?: Yes Type of Advance Directive: Healthcare Power of Attorney, Living will, Out of facility DNR (pink MOST or yellow form) Does patient want to make changes to medical advance directive?: No - Patient declined Copy of Healthcare Power of Attorney in Chart?: No - copy requested  14. Opioids: Patient is not on any opioid prescriptions and has no risk factors for a substance use disorder.   15.   Goals      Become More Active     Timeframe:  Long-Range Goal Priority:  Medium Start Date:                             Expected End Date:                       Follow Up Date 08/03/2024   - choose a type of activity I enjoy such as biking, gardening, team sports, walking    Why is this important?   It is easy to come up with reasons not to exercise.  These steps will help you get started and have fun doing it.    Notes:      Protect My Health           Why is this important?   Screening tests can find diseases early when they are easier to treat.  Your doctor or nurse will talk with you about which tests are important for you.  Getting shots for common diseases like the flu and shingles will help prevent them.     Notes:          I have personally reviewed and noted the following in the patient's chart:   Medical and social history Use  of alcohol, tobacco or illicit drugs  Current medications and supplements Functional ability and status Nutritional status Physical activity Advanced directives List of other physicians Hospitalizations, surgeries, and ER visits in previous 12 months Vitals Screenings to include cognitive, depression, and falls Referrals and  appointments  In addition, I have reviewed and discussed with patient certain preventive protocols, quality metrics, and best practice recommendations. A written personalized care plan for preventive services as well as general preventive health recommendations were provided to patient.   Impression and Plan:  Encounter for subsequent annual wellness visit (AWV) in Medicare patient -     PSA; Future  Diabetes mellitus without complication (HCC) -     Hemoglobin A1c; Future -     CBC with Differential/Platelet; Future -     Comprehensive metabolic panel with GFR; Future -     Microalbumin / creatinine urine ratio  Dyslipidemia -     Lipid panel; Future  Essential hypertension  Hyperlipidemia associated with type 2 diabetes mellitus (HCC)  Screening for lung cancer  Cigarette nicotine dependence without complication  Right hip pain -     Ambulatory referral to Orthopedic Surgery   -Recommend routine eye and dental care. -Healthy lifestyle discussed in detail. -Labs to be updated today. -Prostate cancer screening: PSA today Health Maintenance  Topic Date Due   Yearly kidney health urinalysis for diabetes  Never done   Zoster (Shingles) Vaccine (1 of 2) 09/20/1962   Screening for Lung Cancer  09/17/2022   Hemoglobin A1C  02/09/2024   Colon Cancer Screening  06/10/2024   COVID-19 Vaccine (7 - 2025-26 season) 06/18/2024   Yearly kidney function blood test for diabetes  08/10/2024   DTaP/Tdap/Td vaccine (2 - Td or Tdap) 06/05/2025   Eye exam for diabetics  07/06/2025   Complete foot exam   08/07/2025   Medicare Annual Wellness Visit  08/07/2025    Pneumococcal Vaccine for age over 28  Completed   Flu Shot  Completed   Meningitis B Vaccine  Aged Out     - Has aged out of lung cancer screening. - COVID-vaccine at pharmacy next week.  Advised to obtain shingles. - He will discuss his occasional dysphagia with his GI appointment with Dr. Avram in December. - Will place referral to Dr. Ernie as requested for his right hip pain. - Blood pressure is well-controlled on current. - Check lipids today. - Check A1c, has routine eye care, check microalbumin, foot exam done.    Tully Theophilus Andrews, MD [facility]

## 2024-08-08 ENCOUNTER — Ambulatory Visit: Payer: Self-pay | Admitting: Internal Medicine

## 2024-08-08 ENCOUNTER — Telehealth: Payer: Self-pay

## 2024-08-08 ENCOUNTER — Encounter: Payer: Self-pay | Admitting: Internal Medicine

## 2024-08-08 DIAGNOSIS — R809 Proteinuria, unspecified: Secondary | ICD-10-CM

## 2024-08-08 DIAGNOSIS — E1129 Type 2 diabetes mellitus with other diabetic kidney complication: Secondary | ICD-10-CM

## 2024-08-08 MED ORDER — EMPAGLIFLOZIN 10 MG PO TABS: 10.0000 mg | ORAL_TABLET | Freq: Every day | ORAL | 1 refills | Status: DC

## 2024-08-08 NOTE — Telephone Encounter (Signed)
 Copied from CRM #[id number]. Topic: General - Call Back - No Documentation >> Aug 08, 2024  4:25 PM [name] wrote: Reason for CRM: Pt called due to a missed call from rachel. Reached out to Cal, pt was not available, please reach back out to pt as soon as possible

## 2024-08-08 NOTE — Telephone Encounter (Signed)
Spoke to the patient and reviewed lab results.

## 2024-08-08 NOTE — Telephone Encounter (Signed)
 Spoke to the patient and review lab results.

## 2024-08-08 NOTE — Telephone Encounter (Signed)
 Copied from CRM [id number]. Topic: General - Other >> Aug 08, 2024  4:33 PM Nessti S wrote: Reason for CRM: pt returned call to rachel and would like a call back [phone]

## 2024-08-13 ENCOUNTER — Other Ambulatory Visit (HOSPITAL_BASED_OUTPATIENT_CLINIC_OR_DEPARTMENT_OTHER): Payer: Self-pay

## 2024-08-13 DIAGNOSIS — Z23 Encounter for immunization: Secondary | ICD-10-CM | POA: Diagnosis not present

## 2024-08-13 MED ORDER — COMIRNATY 30 MCG/0.3ML IM SUSY
0.3000 mL | PREFILLED_SYRINGE | Freq: Once | INTRAMUSCULAR | 0 refills | Status: AC
  Filled 2024-08-13: qty 0.3, 1d supply, fill #0

## 2024-08-20 DIAGNOSIS — R82994 Hypercalciuria: Secondary | ICD-10-CM | POA: Diagnosis not present

## 2024-08-20 DIAGNOSIS — N200 Calculus of kidney: Secondary | ICD-10-CM | POA: Diagnosis not present

## 2024-08-28 ENCOUNTER — Other Ambulatory Visit: Payer: Self-pay | Admitting: General Practice

## 2024-08-28 DIAGNOSIS — E785 Hyperlipidemia, unspecified: Secondary | ICD-10-CM

## 2024-09-20 DIAGNOSIS — M542 Cervicalgia: Secondary | ICD-10-CM | POA: Diagnosis not present

## 2024-09-20 DIAGNOSIS — M5412 Radiculopathy, cervical region: Secondary | ICD-10-CM | POA: Diagnosis not present

## 2024-09-20 DIAGNOSIS — M25511 Pain in right shoulder: Secondary | ICD-10-CM | POA: Diagnosis not present

## 2024-09-20 DIAGNOSIS — M25512 Pain in left shoulder: Secondary | ICD-10-CM | POA: Diagnosis not present

## 2024-09-24 DIAGNOSIS — M25551 Pain in right hip: Secondary | ICD-10-CM | POA: Diagnosis not present

## 2024-09-24 DIAGNOSIS — M51362 Other intervertebral disc degeneration, lumbar region with discogenic back pain and lower extremity pain: Secondary | ICD-10-CM | POA: Diagnosis not present

## 2024-09-24 DIAGNOSIS — M48061 Spinal stenosis, lumbar region without neurogenic claudication: Secondary | ICD-10-CM | POA: Diagnosis not present

## 2024-09-27 DIAGNOSIS — M25862 Other specified joint disorders, left knee: Secondary | ICD-10-CM | POA: Diagnosis not present

## 2024-09-27 DIAGNOSIS — M25562 Pain in left knee: Secondary | ICD-10-CM | POA: Diagnosis not present

## 2024-09-28 ENCOUNTER — Ambulatory Visit: Admitting: Internal Medicine

## 2024-09-28 ENCOUNTER — Encounter: Payer: Self-pay | Admitting: Internal Medicine

## 2024-09-28 VITALS — BP 112/62 | HR 84 | Ht 71.0 in | Wt 155.0 lb

## 2024-09-28 DIAGNOSIS — Z800 Family history of malignant neoplasm of digestive organs: Secondary | ICD-10-CM | POA: Diagnosis not present

## 2024-09-28 DIAGNOSIS — Z860101 Personal history of adenomatous and serrated colon polyps: Secondary | ICD-10-CM

## 2024-09-28 DIAGNOSIS — R194 Change in bowel habit: Secondary | ICD-10-CM

## 2024-09-28 MED ORDER — NA SULFATE-K SULFATE-MG SULF 17.5-3.13-1.6 GM/177ML PO SOLN: 1.0000 | ORAL | 0 refills | Status: DC

## 2024-09-28 NOTE — Patient Instructions (Signed)
 You have been scheduled for a colonoscopy. Please follow written instructions given to you at your visit today.   If you use inhalers (even only as needed), please bring them with you on the day of your procedure.  DO NOT TAKE 7 DAYS PRIOR TO TEST- Trulicity (dulaglutide) Ozempic, Wegovy (semaglutide) Mounjaro, Zepbound (tirzepatide) Bydureon Bcise (exanatide extended release)  DO NOT TAKE 1 DAY PRIOR TO YOUR TEST Rybelsus (semaglutide) Adlyxin (lixisenatide) Victoza (liraglutide) Byetta (exanatide) ___________________________________________________________________________  Start Miralax  daily. Coupon provided.   I appreciate the opportunity to care for you. Lupita Commander, MD, [facility]

## 2024-09-28 NOTE — Progress Notes (Signed)
 [name] 83 y.o. Mar 07, 1941 [id number]  Assessment & Plan:   Encounter Diagnosis  Name Primary?   Change in bowel habit Yes   Evaluate with colonoscopy. The risks and benefits as well as alternatives of endoscopic procedure(s) have been discussed and reviewed. All questions answered. The patient agrees to proceed.  Start MiraLAX  daily 1 dose in anticipation of colonoscopy, to try to facilitate a better prep.  Suprep will be used for his colonoscopy preparation.  Consider recheck thyroid  function tests pending results of colonoscopy  CC: Theophilus Andrews, Tully GRADE, MD    Subjective:   Chief Complaint: Change in bowel habits, constipation  HPI 83 year old man with a history of prior colonoscopy procedures who has developed issues with decreased caliber of stool over the last year.  He has tended towards constipation but this is worse than it has been.  There is no bleeding.  He has difficulty having a complete bowel movement.  Gets the urge to go but has to strain and only a small amount but is very narrow comes out.  Denies bleeding.  He does say he has protruding or prolapsed hemorrhoids.  Last colonoscopy 06/10/2021, two 1 mm transverse colon polyps that were adenomas, these were removed with forceps.  Sigmoid and descending diverticulosis.  History of adenomatous polyps in the past also.  Multiple colonoscopies.  Mother had colon cancer.  I had discontinued routine colonoscopy because of his age.  No significant change in medications diet or activity around the time of this bowel habit change.  He has tried daily Metamucil 1 tablespoon daily for weeks to months, also did the same with MiraLAX  and said it made no difference.  No other laxative use.  GI review of systems otherwise negative Lab Results  Component Value Date   WBC 4.6 08/07/2024   HGB 14.9 08/07/2024   HCT 44.5 08/07/2024   MCV 92.0 08/07/2024   PLT 251.0 08/07/2024       Allergies[1] Active  Medications[2] Past Medical History:  Diagnosis Date   Arthritis    generalized   Cataract    BILATERAL-REMOVED   Cigarette smoker    Colon polyp    COPD (chronic obstructive pulmonary disease) (HCC)    Pt reports does not have history of COPD   Coronary artery disease    nonobstructive   Diabetes mellitus without complication (HCC)    on meds/diet controlled   Diverticulosis of colon    Gastritis    Hemorrhoids    Hiatal hernia    late 1990's   History of colonic polyps - ? attenuated polyposis 07/27/2007   Adenomas in past, mother w/ probable colon cancer 2004 - none 04/2009 Polyps, multiple (6, max size 8 mm) ADENOMAS 03/2013 - 6mm adenoma (1) - 07/02/2019 11 diminutive polyps removed - adenomas [name], MD, [facility]      History of kidney stones    History of renal calculi    Hyperlipidemia    on meds   IBS (irritable bowel syndrome)    Lumbar back pain    Pre-diabetes    Past Surgical History:  Procedure Laterality Date   ANTERIOR LAT LUMBAR FUSION  08/14/2012   Procedure: ANTERIOR LATERAL LUMBAR FUSION 1 LEVEL;  Surgeon: Victory DELENA Gunnels, MD;  Location: [facility];  Service: Neurosurgery;  Laterality: Left;  Left lumbar three-four extreme lumbar interbody fusion with percutaneous pedicle screws   BACK SURGERY     x 4   CERVICAL SPINE SURGERY  8013, 1995   1986 in [place], 1995 dr deaton   COLONOSCOPY  2021   with polyp removed x 3 or 4   COLONOSCOPY W/ BIOPSIES AND POLYPECTOMY  05/06/2009   adenomous polyp, diverticulosis, external hemorrhoids   CYSTOSCOPY  11/03   TURP and stone removal - Dr Janit   CYSTOSCOPY WITH RETROGRADE PYELOGRAM, URETEROSCOPY AND STENT PLACEMENT Bilateral 06/20/2020   Procedure: CYSTOSCOPY WITH BILATERAL  RETROGRADE PYELOGRAM, LEFT URETEROSCOPY AND STENT PLACEMENT;  Surgeon: Alvaro Hummer, MD;  Location: [facility];  Service: Urology;  Laterality: Bilateral;  75 MINS   dental implants  2007   EYE SURGERY Bilateral 2019    cataracts both eyes   HOLMIUM LASER APPLICATION Left 06/20/2020   Procedure: HOLMIUM LASER APPLICATION;  Surgeon: Alvaro Hummer, MD;  Location: [facility];  Service: Urology;  Laterality: Left;   LITHOTRIPSY     x 3   LUMBAR MICRODISCECTOMY  12/07   L3-4 by Dr Louis   LUMBAR PERCUTANEOUS PEDICLE SCREW 1 LEVEL  08/14/2012   Procedure: LUMBAR PERCUTANEOUS PEDICLE SCREW 1 LEVEL;  Surgeon: Victory DELENA Louis, MD;  Location: [facility];  Service: Neurosurgery;  Laterality: Left;  Left lumbar three-four extreme lumbar interbody fusion with percutaneous pedicle screws    ROTATOR CUFF REPAIR     left shoulder 2014, right shoulder 2018   UPPER GASTROINTESTINAL ENDOSCOPY  09/07/2010   erosive gastritis, GERD   UVULOPALATOPHARYNGOPLASTY  2001   Dr Mable   Social History   Social History Narrative   Lives with wife and son who has epilepsy.  Retired [employer].     family history includes Colon cancer (age of onset: 57) in his mother; Colon polyps (age of onset: 36) in his mother; Diabetes in his father; Kidney failure in his father; Liver cancer in his mother; Prostate cancer in his brother.   Review of Systems  As above Objective:   Physical Exam @BP  112/62   Pulse 84   Ht 5' 11 (1.803 m)   Wt 155 lb (70.3 kg)   SpO2 97%   BMI 21.62 kg/m @  General:  NAD Eyes:   anicteric Lungs:  clear Heart::  S1S2 no rubs, murmurs or gallops Abdomen:  soft and nontender, BS+ Ext:   no edema, cyanosis or clubbing Rectal  External hemorrhoids, soft brown stool no mass and NL prostate   Data Reviewed:  See HPI     [1] No Known Allergies [2]  Current Meds  Medication Sig   empagliflozin  (JARDIANCE ) 10 MG TABS tablet Take 1 tablet (10 mg total) by mouth daily.   indapamide (LOZOL) 1.25 MG tablet Take 1.25 mg by mouth daily.   metFORMIN  (GLUCOPHAGE ) 500 MG tablet TAKE 1 TABLET(500 MG) BY MOUTH TWICE DAILY WITH A MEAL   Multiple Vitamins-Minerals (MULTIVITAMIN ADULT, MINERALS,)  TABS Take 1 tablet by mouth 2 (two) times daily.   Omega 3-5-6-7-9 Fatty Acids (COMPLETE OMEGA) CAPS Take 1 tablet by mouth daily at 6 (six) AM. Ultimate Omega   simvastatin  (ZOCOR ) 40 MG tablet TAKE 1 TABLET BY MOUTH DAILY

## 2024-10-22 ENCOUNTER — Encounter: Payer: Self-pay | Admitting: Family Medicine

## 2024-10-22 ENCOUNTER — Ambulatory Visit (INDEPENDENT_AMBULATORY_CARE_PROVIDER_SITE_OTHER): Admitting: Family Medicine

## 2024-10-22 ENCOUNTER — Ambulatory Visit: Payer: Self-pay

## 2024-10-22 VITALS — BP 128/80 | HR 94 | Temp 98.9°F | Wt 158.8 lb

## 2024-10-22 DIAGNOSIS — A46 Erysipelas: Secondary | ICD-10-CM | POA: Diagnosis not present

## 2024-10-22 MED ORDER — CEPHALEXIN 500 MG PO CAPS: 500.0000 mg | ORAL_CAPSULE | Freq: Three times a day (TID) | ORAL | 0 refills | Status: AC

## 2024-10-22 MED ORDER — MUPIROCIN 2 % EX OINT: 1.0000 | TOPICAL_OINTMENT | Freq: Two times a day (BID) | CUTANEOUS | 0 refills | Status: AC

## 2024-10-22 NOTE — Telephone Encounter (Signed)
 noted

## 2024-10-22 NOTE — Telephone Encounter (Signed)
 FYI Only or Action Required?: FYI only for provider: appointment scheduled on 01.05.26.  Patient was last seen in primary care on 08/07/2024 by Theophilus Andrews, Tully GRADE, MD.  Called Nurse Triage reporting Rash.  Symptoms began a week ago.  Interventions attempted: Other: topical cream.  Symptoms are: gradually worsening.  Triage Disposition: See PCP When Office is Open (Within 3 Days)  Patient/caregiver understands and will follow disposition?: Yes  Copied from CRM [id number]. Topic: Clinical - Red Word Triage >> Oct 22, 2024  8:26 AM Shasta CROME wrote: Red Word that prompted transfer to Nurse Triage: Rash started on 10/15/2024, things it might be shingles. Doesn't want it to spread to eye. Reason for Disposition  Mild widespread rash  (Exception: Heat rash lasting 3 days or less.)  Answer Assessment - Initial Assessment Questions 1. APPEARANCE of RASH: What does the rash look like? (e.g., blisters, dry flaky skin, red spots, redness, sores)      Looks like shingles, flaky and red  2. SIZE: How big are the spots? (e.g., tip of pen, eraser, coin; inches, centimeters)     quarter  3. LOCATION: Where is the rash located?     Cheek/nose and spreading  4. COLOR: What color is the rash? (Note: It is difficult to assess rash color in people with darker-colored skin. When this situation occurs, simply ask the caller to describe what they see.)      red  5. ONSET: When did the rash begin?     X 1 week  6. FEVER: Do you have a fever? If Yes, ask: What is your temperature, how was it measured, and when did it start?      Denies  7. ITCHING: Does the rash itch? If Yes, ask: How bad is the itch? (Scale 1-10; or mild, moderate, severe)      Denies  8. CAUSE: What do you think is causing the rash?      Believes could be shingles  9. MEDICINE FACTORS: Have you started any new medicines within the last 2 weeks? (e.g., antibiotics)       Denies  10. OTHER SYMPTOMS: Pt  denies dizziness, headache, sore throat.   Pt c/o joint pain but states it is baseline            Pt reports rash Pt is taking OTC topical cream Pt scheduled for a visit on  01.05.26  for further evaluation. Pt agrees with plan of care, will call back for any worsening symptoms  Protocols used: Rash or Redness - [facility]

## 2024-10-22 NOTE — Progress Notes (Signed)
" ° °  Subjective:    Patient ID: [name], male    DOB: 1941/09/07, 84 y.o.   MRN: [id number]  HPI Here for 2 weeks of a bright red rash on his face. The skin is sensitive but not really painful. He feels fine otherwise, no fever.    Review of Systems  Constitutional: Negative.   Respiratory: Negative.    Cardiovascular: Negative.   Skin:  Positive for rash.       Objective:   Physical Exam Constitutional:      Appearance: Normal appearance.  Cardiovascular:     Rate and Rhythm: Normal rate and regular rhythm.     Pulses: Normal pulses.     Heart sounds: Normal heart sounds.  Pulmonary:     Effort: Pulmonary effort is normal.     Breath sounds: Normal breath sounds.  Lymphadenopathy:     Cervical: No cervical adenopathy.  Skin:    Comments: There are areas on the left side of the face near the nose which are macular and bright red   Neurological:     Mental Status: He is alert.           Assessment & Plan:  Erysipelas, we will treat with 10 days of Keflex  500 mg TID and with applying Mupiricin ointment BID.  Garnette Olmsted, MD   "

## 2024-10-25 ENCOUNTER — Encounter: Payer: Self-pay | Admitting: Internal Medicine

## 2024-10-25 ENCOUNTER — Ambulatory Visit (AMBULATORY_SURGERY_CENTER): Admitting: Internal Medicine

## 2024-10-25 VITALS — BP 130/95 | HR 62 | Temp 97.2°F | Resp 11 | Ht 71.0 in | Wt 155.0 lb

## 2024-10-25 DIAGNOSIS — R194 Change in bowel habit: Secondary | ICD-10-CM | POA: Diagnosis not present

## 2024-10-25 DIAGNOSIS — K5730 Diverticulosis of large intestine without perforation or abscess without bleeding: Secondary | ICD-10-CM | POA: Diagnosis not present

## 2024-10-25 DIAGNOSIS — K648 Other hemorrhoids: Secondary | ICD-10-CM | POA: Diagnosis not present

## 2024-10-25 MED ORDER — SODIUM CHLORIDE 0.9 % IV SOLN: 500.0000 mL | INTRAVENOUS | Status: AC

## 2024-10-25 MED ORDER — DEXTROSE 5 % IV SOLN: INTRAVENOUS | Status: AC

## 2024-10-25 NOTE — Progress Notes (Signed)
 History and Physical Interval Note:  10/25/2024 3:01 PM  [name]  has presented today for endoscopic procedure(s), with the diagnosis of  Encounter Diagnosis  Name Primary?   Change in bowel habit Yes  .  The various methods of evaluation and treatment have been discussed with the patient and/or family. After consideration of risks, benefits and other options for treatment, the patient has consented to  the endoscopic procedure(s).   The patient's history has been reviewed, patient examined, no change in status, stable for endoscopic procedure(s).  I have reviewed the patient's chart and labs.  Questions were answered to the patient's satisfaction.     [name], MD, NOLIA

## 2024-10-25 NOTE — Patient Instructions (Addendum)
 No polyps today.  You do have diverticulosis and some hemorrhoids.  Diverticulosis can be the cause of your constipation issues.  I recommend you continue a daily dose of MiraLAX  and take 2 tablespoons of Metamucil daily to see if that helps.  If it does not let me know and we can consider some other measures.  You can send me a MyChart message or call in.   I appreciate the opportunity to care for you. [name], MD, FACG  YOU HAD AN ENDOSCOPIC PROCEDURE TODAY AT THE [facility]:   Refer to the procedure report that was given to you for any specific questions about what was found during the examination.  If the procedure report does not answer your questions, please call your gastroenterologist to clarify.  If you requested that your care partner not be given the details of your procedure findings, then the procedure report has been included in a sealed envelope for you to review at your convenience later.  YOU SHOULD EXPECT: Some feelings of bloating in the abdomen. Passage of more gas than usual.  Walking can help get rid of the air that was put into your GI tract during the procedure and reduce the bloating. If you had a lower endoscopy (such as a colonoscopy or flexible sigmoidoscopy) you may notice spotting of blood in your stool or on the toilet paper. If you underwent a bowel prep for your procedure, you may not have a normal bowel movement for a few days.  Please Note:  You might notice some irritation and congestion in your nose or some drainage.  This is from the oxygen used during your procedure.  There is no need for concern and it should clear up in a day or so.  SYMPTOMS TO REPORT IMMEDIATELY:  Following lower endoscopy (colonoscopy or flexible sigmoidoscopy):  Excessive amounts of blood in the stool  Significant tenderness or worsening of abdominal pains  Swelling of the abdomen that is new, acute  Fever of 100F or higher  For urgent or emergent issues, a  gastroenterologist can be reached at any hour by calling [phone]. Do not use MyChart messaging for urgent concerns.    DIET:  We do recommend a small meal at first, but then you may proceed to your regular diet.  Drink plenty of fluids but you should avoid alcoholic beverages for 24 hours.  ACTIVITY:  You should plan to take it easy for the rest of today and you should NOT DRIVE or use heavy machinery until tomorrow (because of the sedation medicines used during the test).    FOLLOW UP: Our staff will call the number listed on your records the next business day following your procedure.  We will call around 7:15- 8:00 am to check on you and address any questions or concerns that you may have regarding the information given to you following your procedure. If we do not reach you, we will leave a message.     If any biopsies were taken you will be contacted by phone or by letter within the next 1-3 weeks.  Please call us  at [phone] if you have not heard about the biopsies in 3 weeks.    SIGNATURES/CONFIDENTIALITY: You and/or your care partner have signed paperwork which will be entered into your electronic medical record.  These signatures attest to the fact that that the information above on your After Visit Summary has been reviewed and is understood.  Full responsibility of the confidentiality  of this discharge information lies with you and/or your care-partner.

## 2024-10-25 NOTE — Progress Notes (Signed)
 Pt's states no medical or surgical changes since previsit or office visit.

## 2024-10-25 NOTE — Progress Notes (Signed)
 A/o x 3, VSS, good SR's, pleased with anesthesia, report to RN

## 2024-10-25 NOTE — Op Note (Signed)
 [facility] Patient Name: [name] Procedure Date: 10/25/2024 2:45 PM MRN: [id number] Endoscopist: Lupita FORBES Commander , MD, [id number] Age: 84 Referring MD:  Date of Birth: 02-Nov-1940 Gender: Male Account #: [id number] Procedure:                Colonoscopy Indications:              Change in bowel habits Medicines:                Monitored Anesthesia Care Procedure:                Pre-Anesthesia Assessment:                           - Prior to the procedure, a History and Physical                            was performed, and patient medications and                            allergies were reviewed. The patient's tolerance of                            previous anesthesia was also reviewed. The risks                            and benefits of the procedure and the sedation                            options and risks were discussed with the patient.                            All questions were answered, and informed consent                            was obtained. Prior Anticoagulants: The patient has                            taken no anticoagulant or antiplatelet agents. ASA                            Grade Assessment: III - A patient with severe                            systemic disease. After reviewing the risks and                            benefits, the patient was deemed in satisfactory                            condition to undergo the procedure.                           After obtaining informed consent, the colonoscope  was passed under direct vision. Throughout the                            procedure, the patient's blood pressure, pulse, and                            oxygen saturations were monitored continuously. The                            Olympus Scope SN: [id number] was introduced through                            the anus and advanced to the the cecum, identified                            by appendiceal orifice and  ileocecal valve. The                            colonoscopy was performed without difficulty. The                            patient tolerated the procedure well. The quality                            of the bowel preparation was good. The ileocecal                            valve, appendiceal orifice, and rectum were                            photographed. The bowel preparation used was SUPREP                            via split dose instruction. Scope In: 3:04:22 PM Scope Out: 3:14:54 PM Scope Withdrawal Time: 0 hours 6 minutes 26 seconds  Total Procedure Duration: 0 hours 10 minutes 32 seconds  Findings:                 The perianal and digital rectal examinations were                            normal.                           Multiple diverticula were found in the sigmoid                            colon and descending colon. There was narrowing of                            the colon in association with the diverticular                            opening.  Internal hemorrhoids were found.                           The exam was otherwise without abnormality on                            direct and retroflexion views. Complications:            No immediate complications. Estimated Blood Loss:     Estimated blood loss: none. Impression:               - Severe diverticulosis in the sigmoid colon and in                            the descending colon. There was narrowing of the                            colon in association with the diverticular opening.                           - Internal hemorrhoids.                           - The examination was otherwise normal on direct                            and retroflexion views.                           - No specimens collected. Recommendation:           - Patient has a contact number available for                            emergencies. The signs and symptoms of potential                             delayed complications were discussed with the                            patient. Return to normal activities tomorrow.                            Written discharge instructions were provided to the                            patient.                           - High fiber diet.                           - Continue present medications.                           - No repeat colonoscopy.                           -  Continue MiraLax  1 daily and take 2 tablespoons                            Metamucil daily Lupita FORBES Commander, MD 10/25/2024 3:23:54 PM This report has been signed electronically.

## 2024-10-26 ENCOUNTER — Telehealth: Payer: Self-pay | Admitting: *Deleted

## 2024-10-26 NOTE — Telephone Encounter (Signed)
" °  Follow up Call-     10/25/2024    1:28 PM  Call back number  Post procedure Call Back phone  # [phone]  Permission to leave phone message Yes     Patient questions:  Do you have a fever, pain , or abdominal swelling? No. Pain Score  0 *  Have you tolerated food without any problems? Yes.    Have you been able to return to your normal activities? Yes.    Do you have any questions about your discharge instructions: Diet   No. Medications  No. Follow up visit  No.  Do you have questions or concerns about your Care? No.  Actions: * If pain score is 4 or above: No action needed, pain <4.   "

## 2024-10-27 ENCOUNTER — Other Ambulatory Visit: Payer: Self-pay | Admitting: Internal Medicine

## 2024-10-27 DIAGNOSIS — E119 Type 2 diabetes mellitus without complications: Secondary | ICD-10-CM

## 2024-11-05 ENCOUNTER — Encounter: Payer: Self-pay | Admitting: Internal Medicine

## 2024-11-05 ENCOUNTER — Telehealth: Payer: Self-pay | Admitting: Cardiology

## 2024-11-05 ENCOUNTER — Ambulatory Visit: Admitting: Internal Medicine

## 2024-11-05 VITALS — BP 128/78 | HR 85 | Temp 98.2°F | Wt 161.5 lb

## 2024-11-05 DIAGNOSIS — Z7984 Long term (current) use of oral hypoglycemic drugs: Secondary | ICD-10-CM | POA: Diagnosis not present

## 2024-11-05 DIAGNOSIS — I10 Essential (primary) hypertension: Secondary | ICD-10-CM | POA: Diagnosis not present

## 2024-11-05 DIAGNOSIS — R809 Proteinuria, unspecified: Secondary | ICD-10-CM

## 2024-11-05 DIAGNOSIS — E1129 Type 2 diabetes mellitus with other diabetic kidney complication: Secondary | ICD-10-CM | POA: Diagnosis not present

## 2024-11-05 DIAGNOSIS — E785 Hyperlipidemia, unspecified: Secondary | ICD-10-CM | POA: Diagnosis not present

## 2024-11-05 LAB — POCT GLYCOSYLATED HEMOGLOBIN (HGB A1C): Hemoglobin A1C: 7.2 % — AB (ref 4.0–5.6)

## 2024-11-05 MED ORDER — EMPAGLIFLOZIN 25 MG PO TABS: 25.0000 mg | ORAL_TABLET | Freq: Every day | ORAL | 1 refills | Status: AC

## 2024-11-05 MED ORDER — SIMVASTATIN 40 MG PO TABS: 40.0000 mg | ORAL_TABLET | Freq: Every day | ORAL | 0 refills | Status: AC

## 2024-11-05 NOTE — Telephone Encounter (Signed)
 Pt called in requesting to switch to Dr. Lonni, is this switch okay?

## 2024-11-05 NOTE — Assessment & Plan Note (Signed)
 A1c is improved but still slightly above goal at 7.2.  Continue metformin , increase Jardiance  to 25 mg.  Return in 3 to 4 months for follow-up.  Discussed lifestyle changes.

## 2024-11-05 NOTE — Assessment & Plan Note (Signed)
 Refill statin, check lipids.

## 2024-11-05 NOTE — Assessment & Plan Note (Signed)
On Jardiance  

## 2024-11-05 NOTE — Progress Notes (Signed)
 "    Established Patient Office Visit     CC/Reason for Visit: Follow-up chronic medical conditions  HPI: [name] is a 84 y.o. male who is coming in today for the above mentioned reasons. Past Medical History is significant for: Hypertension, hyperlipidemia, type 2 diabetes.  Recovering well after fall.  No other concerns or complaints.   Past Medical/Surgical History: Past Medical History:  Diagnosis Date   Arthritis    generalized   Cataract    BILATERAL-REMOVED   Cigarette smoker    Colon polyp    COPD (chronic obstructive pulmonary disease) (HCC)    Pt reports does not have history of COPD   Coronary artery disease    nonobstructive   Diabetes mellitus without complication (HCC)    on meds/diet controlled   Diverticulosis of colon    Gastritis    Hemorrhoids    Hiatal hernia    late 1990's   History of colonic polyps - ? attenuated polyposis 07/27/2007   Adenomas in past, mother w/ probable colon cancer 2004 - none 04/2009 Polyps, multiple (6, max size 8 mm) ADENOMAS 03/2013 - 6mm adenoma (1) - 07/02/2019 11 diminutive polyps removed - adenomas Paul CHARLENA Commander, MD, [facility]      History of kidney stones    History of renal calculi    Hyperlipidemia    on meds   IBS (irritable bowel syndrome)    Lumbar back pain    Pre-diabetes     Past Surgical History:  Procedure Laterality Date   ANTERIOR LAT LUMBAR FUSION  08/14/2012   Procedure: ANTERIOR LATERAL LUMBAR FUSION 1 LEVEL;  Surgeon: Paul DELENA Gunnels, MD;  Location: [facility];  Service: Neurosurgery;  Laterality: Left;  Left lumbar three-four extreme lumbar interbody fusion with percutaneous pedicle screws   BACK SURGERY     x 4   CERVICAL SPINE SURGERY  1986, 1995   1986 in [place], 1995 dr deaton   COLONOSCOPY  2021   with polyp removed x 3 or 4   COLONOSCOPY W/ BIOPSIES AND POLYPECTOMY  05/06/2009   adenomous polyp, diverticulosis, external hemorrhoids   CYSTOSCOPY  11/03   TURP and stone removal - Dr  [name]   CYSTOSCOPY WITH RETROGRADE PYELOGRAM, URETEROSCOPY AND STENT PLACEMENT Bilateral 06/20/2020   Procedure: CYSTOSCOPY WITH BILATERAL  RETROGRADE PYELOGRAM, LEFT URETEROSCOPY AND STENT PLACEMENT;  Surgeon: Paul Hummer, MD;  Location: [facility];  Service: Urology;  Laterality: Bilateral;  75 MINS   dental implants  2007   EYE SURGERY Bilateral 2019   cataracts both eyes   HOLMIUM LASER APPLICATION Left 06/20/2020   Procedure: HOLMIUM LASER APPLICATION;  Surgeon: Paul Hummer, MD;  Location: [facility];  Service: Urology;  Laterality: Left;   LITHOTRIPSY     x 3   LUMBAR MICRODISCECTOMY  12/07   L3-4 by Dr Werner   LUMBAR PERCUTANEOUS PEDICLE SCREW 1 LEVEL  08/14/2012   Procedure: LUMBAR PERCUTANEOUS PEDICLE SCREW 1 LEVEL;  Surgeon: Paul DELENA Gunnels, MD;  Location: [facility];  Service: Neurosurgery;  Laterality: Left;  Left lumbar three-four extreme lumbar interbody fusion with percutaneous pedicle screws    ROTATOR CUFF REPAIR     left shoulder 2014, right shoulder 2018   UPPER GASTROINTESTINAL ENDOSCOPY  09/07/2010   erosive gastritis, GERD   UVULOPALATOPHARYNGOPLASTY  2001   Dr [name]    Social History:  reports that he quit smoking about 7 years ago. His smoking use included cigarettes. He  started smoking about 58 years ago. He has a 40.8 pack-year smoking history. He has never used smokeless tobacco. He reports current alcohol use of about 5.0 standard drinks of alcohol per week. He reports that he does not use drugs.  Allergies: Allergies[1]  Family History:  Family History  Problem Relation Age of Onset   Liver cancer Mother    Colon polyps Mother 35   Colon cancer Mother 69       mets to other organs   Diabetes Father    Kidney failure Father    Prostate cancer Brother    Esophageal cancer Neg Hx    Rectal cancer Neg Hx    Stomach cancer Neg Hx     Current Medications[2]  Review of Systems:  Negative unless indicated in  HPI.   Physical Exam: Vitals:   11/05/24 0847 11/05/24 0856  BP: 130/84 128/78  Pulse: 85   Temp: 98.2 F (36.8 C)   TempSrc: Oral   SpO2: 95%   Weight: 161 lb 8 oz (73.3 kg)     Body mass index is 22.52 kg/m.   Physical Exam Vitals reviewed.  Constitutional:      Appearance: Normal appearance.  HENT:     Head: Normocephalic and atraumatic.  Eyes:     Conjunctiva/sclera: Conjunctivae normal.  Cardiovascular:     Rate and Rhythm: Normal rate and regular rhythm.  Pulmonary:     Effort: Pulmonary effort is normal.     Breath sounds: Normal breath sounds.  Skin:    General: Skin is warm and dry.  Neurological:     General: No focal deficit present.     Mental Status: He is alert and oriented to person, place, and time.  Psychiatric:        Mood and Affect: Mood normal.        Behavior: Behavior normal.        Thought Content: Thought content normal.        Judgment: Judgment normal.      Impression and Plan:  Type 2 diabetes mellitus with diabetic microalbuminuria, without long-term current use of insulin (HCC) Assessment & Plan: A1c is improved but still slightly above goal at 7.2.  Continue metformin , increase Jardiance  to 25 mg.  Return in 3 to 4 months for follow-up.  Discussed lifestyle changes.  Orders: -     POCT glycosylated hemoglobin (Hb A1C) -     Empagliflozin ; Take 1 tablet (25 mg total) by mouth daily.  Dispense: 90 tablet; Refill: 1  Dyslipidemia Assessment & Plan: Refill statin, check lipids.  Orders: -     Lipid panel; Future -     Simvastatin ; Take 1 tablet (40 mg total) by mouth daily.  Dispense: 90 tablet; Refill: 0  Microalbuminuria due to type 2 diabetes mellitus (HCC) Assessment & Plan: On Jardiance .   Essential hypertension Assessment & Plan: Fair control on current.      Time spent:31 minutes reviewing chart, interviewing and examining patient and formulating plan of care.     Paul Theophilus Andrews, MD [place]  [facility] at [facility]     [1] No Known Allergies [2]  Current Outpatient Medications:    empagliflozin  (JARDIANCE ) 25 MG TABS tablet, Take 1 tablet (25 mg total) by mouth daily., Disp: 90 tablet, Rfl: 1   indapamide (LOZOL) 1.25 MG tablet, Take 1.25 mg by mouth daily., Disp: , Rfl:    metFORMIN  (GLUCOPHAGE ) 500 MG tablet, TAKE 1 TABLET(500 MG) BY MOUTH TWICE DAILY WITH  A MEAL, Disp: 180 tablet, Rfl: 1   Multiple Vitamins-Minerals (MULTIVITAMIN ADULT, MINERALS,) TABS, Take 1 tablet by mouth 2 (two) times daily., Disp: , Rfl:    Omega 3-5-6-7-9 Fatty Acids (COMPLETE OMEGA) CAPS, Take 1 tablet by mouth daily at 6 (six) AM. Ultimate Omega, Disp: , Rfl:    mupirocin  ointment (BACTROBAN ) 2 %, Apply 1 Application topically 2 (two) times daily., Disp: 30 g, Rfl: 0   Na Sulfate-K Sulfate-Mg Sulfate concentrate (SUPREP) 17.5-3.13-1.6 GM/177ML SOLN, Take 1 kit (354 mLs total) by mouth as directed., Disp: 354 mL, Rfl: 0   simvastatin  (ZOCOR ) 40 MG tablet, Take 1 tablet (40 mg total) by mouth daily., Disp: 90 tablet, Rfl: 0  "

## 2024-11-05 NOTE — Assessment & Plan Note (Signed)
 Fair control on current.

## 2024-11-06 NOTE — Telephone Encounter (Signed)
 Called pt to sch with Dr. Lonni. He said he will c/b to sch.

## 2024-11-22 ENCOUNTER — Encounter (HOSPITAL_BASED_OUTPATIENT_CLINIC_OR_DEPARTMENT_OTHER): Payer: Self-pay | Admitting: Cardiology

## 2024-11-22 ENCOUNTER — Ambulatory Visit (INDEPENDENT_AMBULATORY_CARE_PROVIDER_SITE_OTHER): Admitting: Cardiology

## 2024-11-22 VITALS — BP 138/70 | HR 67 | Ht 71.0 in | Wt 160.0 lb

## 2024-11-22 DIAGNOSIS — E7800 Pure hypercholesterolemia, unspecified: Secondary | ICD-10-CM

## 2024-11-22 DIAGNOSIS — I2510 Atherosclerotic heart disease of native coronary artery without angina pectoris: Secondary | ICD-10-CM | POA: Diagnosis not present

## 2024-11-22 DIAGNOSIS — E782 Mixed hyperlipidemia: Secondary | ICD-10-CM | POA: Diagnosis not present

## 2024-11-22 DIAGNOSIS — I10 Essential (primary) hypertension: Secondary | ICD-10-CM

## 2024-11-22 DIAGNOSIS — Z7189 Other specified counseling: Secondary | ICD-10-CM | POA: Diagnosis not present

## 2024-11-22 MED ORDER — ASPIRIN 81 MG PO TBEC: 81.0000 mg | DELAYED_RELEASE_TABLET | Freq: Every day | ORAL | Status: AC

## 2024-11-22 NOTE — Patient Instructions (Signed)
 Medication Instructions:  Your physician has recommended you make the following change in your medication:  1.) start aspirin  81 mg - one tablet daily (enteric coated --not the chewable version)  *If you need a refill on your cardiac medications before your next appointment, please call your pharmacy*  Lab Work: none If you have labs (blood work) drawn today and your tests are completely normal, you will receive your results only by: MyChart Message (if you have MyChart) OR A paper copy in the mail If you have any lab test that is abnormal or we need to change your treatment, we will call you to review the results.  Testing/Procedures: none  Follow-Up: At [facility], you and your health needs are our priority.  As part of our continuing mission to provide you with exceptional heart care, our providers are all part of one team.  This team includes your primary Cardiologist (physician) and Advanced Practice Providers or APPs (Physician Assistants and Nurse Practitioners) who all work together to provide you with the care you need, when you need it.  Your next appointment:   12 year(s)  Provider:   Shelda Bruckner, MD, Rosaline Bane, NP, or Reche Finder, NP

## 2024-11-22 NOTE — Progress Notes (Signed)
 " Cardiology Office Note:  .   Date:  11/22/2024  ID:  [name], DOB May 27, 1941, MRN [id number] PCP: Theophilus Andrews, Tully GRADE, MD  [facility] Providers Cardiologist:  Shelda Bruckner, MD {  History of Present Illness: .   [name] is a 84 y.o. male with PMH CAD, hyperlipidemia. He was previously followed by Dr. Lavona and established care with me on 11/22/24.  Pertinent CV history: ETT 2021 was positive with horizontal ST depression during stress. Had follow up coronary CT 2021 which showed plaque and generally mild stenosis except for a 50-69% stenosis in the ramus.  Today: Overall doing well. Will get rare chest tightness/pinching with climbing stairs or taking the trash can out, lasts only about 30 seconds. Can ride stationary bike for 25-30 minutes every other day without issues. Walks but is limited by his knee.   Appetite is good.   Advised to stop baby aspirin  4-5 years. We discussed today, he never had issues with aspirin  before, willing to restart  ROS: Denies shortness of breath at rest or with normal exertion. No PND, orthopnea, LE edema or unexpected weight gain. No syncope or palpitations. ROS otherwise negative except as noted.   Studies Reviewed: SABRA    EKG:  EKG Interpretation Date/Time:  Thursday November 22 2024 16:37:05 EST Ventricular Rate:  67 PR Interval:  210 QRS Duration:  100 QT Interval:  410 QTC Calculation: 433 R Axis:   49  Text Interpretation: Sinus rhythm with 1st degree A-V block Nonspecific ST abnormality When compared with ECG of 08-Aug-2012 09:35, No significant change was found Confirmed by Bruckner Shelda [phone]) on 11/22/2024 5:41:22 PM    Physical Exam:   VS:  BP 138/70 (BP Location: Left Arm, Patient Position: Sitting, Cuff Size: Normal)   Pulse 67   Ht 5' 11 (1.803 m)   Wt 160 lb (72.6 kg)   SpO2 98%   BMI 22.32 kg/m    Wt Readings from Last 3 Encounters:  11/22/24 160 lb (72.6 kg)  11/05/24 161 lb  8 oz (73.3 kg)  10/25/24 155 lb (70.3 kg)    GEN: Well nourished, well developed in no acute distress HEENT: Normal, moist mucous membranes NECK: No JVD CARDIAC: regular rhythm, normal S1 and S2, no rubs or gallops. No murmur. VASCULAR: Radial and DP pulses 2+ bilaterally. No carotid bruits RESPIRATORY:  Clear to auscultation without rales, wheezing or rhonchi  ABDOMEN: Soft, non-tender, non-distended MUSCULOSKELETAL:  Ambulates independently SKIN: Warm and dry, no edema NEUROLOGIC:  Alert and oriented x 3. No focal neuro deficits noted. PSYCHIATRIC:  Normal affect    ASSESSMENT AND PLAN: .    CAD Hyperlipidemia -by coronary CT in 2021. We reviewed his images together today -last lipids 07/2024 showed Tchol 135, TG 86, HDL 55, LDL 62 -currently on simvastatin  40 mg daily, has been on this for a long time -we discussed aspirin  today. No bleeding issues, he tolerated in the past. Willing to restart. Discussed watching for bleeding. -he is in excellent health overall and has a strong family history of longevity, so would treat as younger than his chronological age  Hypertension -on indapamide -did discuss that this plus Jardiance  can lead to dehydration. If this becomes an issue, would stop indapamide (using this 2/2 having kidney stones a long time ago, followed by his urologist). Could then use another agent if needed for blood pressure  Type II diabetes -generally well controlled, recently started on Jardiance . Has been on metformin   for some time.  CV risk counseling and prevention -recommend heart healthy/Mediterranean diet, with whole grains, fruits, vegetable, fish, lean meats, nuts, and olive oil. Limit salt. -recommend moderate walking, 3-5 times/week for 30-50 minutes each session. Aim for at least 150 minutes/week. Goal should be pace of 3 miles/hours, or walking 1.5 miles in 30 minutes -recommend avoidance of tobacco products. Avoid excess alcohol.  Dispo: follow up in 1  year per patient preference  Signed, Shelda Bruckner, MD   Shelda Bruckner, MD, PhD, [facility] [place]  [facility]  [place]  Heart & Vascular at [facility] at [facility] [address], [address] [place], [place] [address] [phone]   "

## 2025-02-05 ENCOUNTER — Ambulatory Visit: Admitting: Internal Medicine

## 2025-03-05 ENCOUNTER — Ambulatory Visit: Admitting: Internal Medicine
